# Patient Record
Sex: Male | Born: 1950
Health system: Southern US, Community
[De-identification: ages and names within clinical notes are randomized; demographics above are authoritative.]

## PROBLEM LIST (undated history)

## (undated) DIAGNOSIS — G43909 Migraine, unspecified, not intractable, without status migrainosus: Secondary | ICD-10-CM

## (undated) DIAGNOSIS — R251 Tremor, unspecified: Secondary | ICD-10-CM

## (undated) DIAGNOSIS — M109 Gout, unspecified: Principal | ICD-10-CM

## (undated) DIAGNOSIS — T7840XA Allergy, unspecified, initial encounter: Secondary | ICD-10-CM

## (undated) DIAGNOSIS — E119 Type 2 diabetes mellitus without complications: Secondary | ICD-10-CM

## (undated) DIAGNOSIS — G4700 Insomnia, unspecified: Secondary | ICD-10-CM

## (undated) DIAGNOSIS — G4733 Obstructive sleep apnea (adult) (pediatric): Secondary | ICD-10-CM

## (undated) DIAGNOSIS — I10 Essential (primary) hypertension: Secondary | ICD-10-CM

## (undated) DIAGNOSIS — G2581 Restless legs syndrome: Secondary | ICD-10-CM

## (undated) DIAGNOSIS — G4730 Sleep apnea, unspecified: Secondary | ICD-10-CM

## (undated) DIAGNOSIS — Z0000 Encounter for general adult medical examination without abnormal findings: Secondary | ICD-10-CM

## (undated) DIAGNOSIS — R1310 Dysphagia, unspecified: Secondary | ICD-10-CM

## (undated) DIAGNOSIS — K219 Gastro-esophageal reflux disease without esophagitis: Secondary | ICD-10-CM

## (undated) DIAGNOSIS — F32A Depression, unspecified: Secondary | ICD-10-CM

## (undated) DIAGNOSIS — M25511 Pain in right shoulder: Secondary | ICD-10-CM

## (undated) DIAGNOSIS — M1990 Unspecified osteoarthritis, unspecified site: Secondary | ICD-10-CM

## (undated) DIAGNOSIS — D649 Anemia, unspecified: Secondary | ICD-10-CM

## (undated) HISTORY — DX: Tremor, unspecified: R25.1

## (undated) HISTORY — DX: Essential (primary) hypertension: I10

## (undated) HISTORY — DX: Gout, unspecified: M10.9

## (undated) HISTORY — DX: Restless legs syndrome: G25.81

## (undated) HISTORY — DX: Obstructive sleep apnea (adult) (pediatric): G47.33

## (undated) HISTORY — DX: Anemia, unspecified: D64.9

## (undated) HISTORY — DX: Encounter for general adult medical examination without abnormal findings: Z00.00

## (undated) HISTORY — DX: Depression, unspecified: F32.A

## (undated) HISTORY — DX: Gastro-esophageal reflux disease without esophagitis: K21.9

## (undated) HISTORY — DX: Sleep apnea, unspecified: G47.30

## (undated) HISTORY — DX: Migraine, unspecified, not intractable, without status migrainosus: G43.909

## (undated) HISTORY — PX: VASECTOMY: SHX75

## (undated) HISTORY — DX: Dysphagia, unspecified: R13.10

## (undated) HISTORY — DX: Allergy, unspecified, initial encounter: T78.40XA

## (undated) HISTORY — DX: Unspecified osteoarthritis, unspecified site: M19.90

## (undated) HISTORY — PX: KNEE SURGERY: SHX244

## (undated) HISTORY — DX: Pain in right shoulder: M25.511

## (undated) HISTORY — DX: Type 2 diabetes mellitus without complications: E11.9

## (undated) HISTORY — DX: Insomnia, unspecified: G47.00

---
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
## 1996-07-11 HISTORY — PX: HERNIA REPAIR: SHX51

## 2006-10-31 ENCOUNTER — Ambulatory Visit: Payer: Self-pay | Admitting: Internal Medicine

## 2006-11-26 ENCOUNTER — Ambulatory Visit (HOSPITAL_BASED_OUTPATIENT_CLINIC_OR_DEPARTMENT_OTHER): Admission: RE | Admit: 2006-11-26 | Discharge: 2006-11-26 | Payer: Self-pay | Admitting: Internal Medicine

## 2006-12-04 ENCOUNTER — Ambulatory Visit: Payer: Self-pay | Admitting: Internal Medicine

## 2006-12-14 ENCOUNTER — Ambulatory Visit: Payer: Self-pay | Admitting: Internal Medicine

## 2007-01-15 ENCOUNTER — Ambulatory Visit: Payer: Self-pay | Admitting: Internal Medicine

## 2007-05-18 ENCOUNTER — Ambulatory Visit: Payer: Self-pay | Admitting: Internal Medicine

## 2007-07-12 DIAGNOSIS — E119 Type 2 diabetes mellitus without complications: Secondary | ICD-10-CM

## 2007-07-12 HISTORY — DX: Type 2 diabetes mellitus without complications: E11.9

## 2007-07-31 ENCOUNTER — Ambulatory Visit: Payer: Self-pay | Admitting: Internal Medicine

## 2007-07-31 DIAGNOSIS — E119 Type 2 diabetes mellitus without complications: Secondary | ICD-10-CM | POA: Insufficient documentation

## 2007-07-31 DIAGNOSIS — I10 Essential (primary) hypertension: Secondary | ICD-10-CM

## 2007-07-31 DIAGNOSIS — E669 Obesity, unspecified: Secondary | ICD-10-CM | POA: Insufficient documentation

## 2007-07-31 DIAGNOSIS — E1169 Type 2 diabetes mellitus with other specified complication: Secondary | ICD-10-CM

## 2007-07-31 DIAGNOSIS — G4733 Obstructive sleep apnea (adult) (pediatric): Secondary | ICD-10-CM | POA: Insufficient documentation

## 2007-08-10 ENCOUNTER — Ambulatory Visit: Payer: Self-pay | Admitting: Internal Medicine

## 2007-08-10 LAB — CONVERTED CEMR LAB
ALT: 39 units/L (ref 0–53)
AST: 35 units/L (ref 0–37)
Albumin: 4.1 g/dL (ref 3.5–5.2)
Alkaline Phosphatase: 66 units/L (ref 39–117)
BUN: 12 mg/dL (ref 6–23)
Bacteria, UA: NEGATIVE
Bilirubin, Direct: 0.3 mg/dL (ref 0.0–0.3)
Calcium: 9.2 mg/dL (ref 8.4–10.5)
Chloride: 104 meq/L (ref 96–112)
Eosinophils Relative: 1.7 % (ref 0.0–5.0)
GFR calc Af Amer: 81 mL/min
GFR calc non Af Amer: 67 mL/min
Glucose, Bld: 119 mg/dL — ABNORMAL HIGH (ref 70–99)
HCT: 46.5 % (ref 39.0–52.0)
Hemoglobin, Urine: NEGATIVE
Ketones, ur: 40 mg/dL — AB
LDL Cholesterol: 110 mg/dL — ABNORMAL HIGH (ref 0–99)
Mucus, UA: NEGATIVE
Neutrophils Relative %: 59.8 % (ref 43.0–77.0)
Platelets: 273 10*3/uL (ref 150–400)
RBC: 5.22 M/uL (ref 4.22–5.81)
RDW: 12.6 % (ref 11.5–14.6)
Squamous Epithelial / LPF: NEGATIVE /lpf
TSH: 1.55 microintl units/mL (ref 0.35–5.50)
Total CHOL/HDL Ratio: 6.6
Total Protein, Urine: NEGATIVE mg/dL
VLDL: 15 mg/dL (ref 0–40)
WBC: 5.4 10*3/uL (ref 4.5–10.5)
pH: 5.5 (ref 5.0–8.0)

## 2007-08-15 ENCOUNTER — Ambulatory Visit: Payer: Self-pay | Admitting: Internal Medicine

## 2007-08-15 DIAGNOSIS — E782 Mixed hyperlipidemia: Secondary | ICD-10-CM

## 2007-09-05 ENCOUNTER — Encounter: Payer: Self-pay | Admitting: Internal Medicine

## 2007-10-12 ENCOUNTER — Ambulatory Visit: Payer: Self-pay | Admitting: Internal Medicine

## 2007-10-12 LAB — CONVERTED CEMR LAB
ALT: 20 units/L (ref 0–53)
AST: 19 units/L (ref 0–37)
BUN: 14 mg/dL (ref 6–23)
Bacteria, UA: NEGATIVE
Bilirubin Urine: NEGATIVE
CO2: 28 meq/L (ref 19–32)
Calcium: 9 mg/dL (ref 8.4–10.5)
Cholesterol: 91 mg/dL (ref 0–200)
Crystals: NEGATIVE
Glucose, Bld: 98 mg/dL (ref 70–99)
HDL: 23.1 mg/dL — ABNORMAL LOW (ref >39.0)
Hemoglobin, Urine: NEGATIVE
Ketones, ur: NEGATIVE mg/dL
LDL Cholesterol: 57 mg/dL (ref 0–99)
Sodium: 140 meq/L (ref 135–145)
Total Protein, Urine: NEGATIVE mg/dL
Triglycerides: 57 mg/dL (ref 0–149)
Urine Glucose: NEGATIVE mg/dL
Urobilinogen, UA: 0.2 (ref 0.0–1.0)
VLDL: 11 mg/dL (ref 0–40)
WBC, UA: NONE SEEN cells/hpf

## 2007-10-18 ENCOUNTER — Ambulatory Visit: Payer: Self-pay | Admitting: Internal Medicine

## 2007-12-07 ENCOUNTER — Ambulatory Visit: Payer: Self-pay | Admitting: Internal Medicine

## 2007-12-07 LAB — CONVERTED CEMR LAB: Creatinine, Ser: 1.1 mg/dL (ref 0.4–1.5)

## 2007-12-09 ENCOUNTER — Telehealth: Payer: Self-pay | Admitting: Internal Medicine

## 2007-12-13 ENCOUNTER — Ambulatory Visit: Payer: Self-pay | Admitting: Internal Medicine

## 2007-12-13 LAB — CONVERTED CEMR LAB
OCCULT 1: NEGATIVE
OCCULT 2: NEGATIVE
OCCULT 4: NEGATIVE

## 2007-12-14 ENCOUNTER — Telehealth: Payer: Self-pay | Admitting: Internal Medicine

## 2008-01-04 ENCOUNTER — Ambulatory Visit: Payer: Self-pay | Admitting: Internal Medicine

## 2008-04-25 ENCOUNTER — Ambulatory Visit: Payer: Self-pay | Admitting: Internal Medicine

## 2008-04-25 LAB — CONVERTED CEMR LAB
Microalb Creat Ratio: 9.4 mg/g (ref 0.0–30.0)
Microalb, Ur: 1.2 mg/dL (ref 0.0–1.9)

## 2008-05-02 ENCOUNTER — Ambulatory Visit: Payer: Self-pay | Admitting: Internal Medicine

## 2008-05-02 DIAGNOSIS — R209 Unspecified disturbances of skin sensation: Secondary | ICD-10-CM

## 2008-05-16 ENCOUNTER — Ambulatory Visit: Payer: Self-pay | Admitting: Internal Medicine

## 2008-05-16 DIAGNOSIS — J310 Chronic rhinitis: Secondary | ICD-10-CM | POA: Insufficient documentation

## 2008-08-26 ENCOUNTER — Ambulatory Visit: Payer: Self-pay | Admitting: Internal Medicine

## 2008-08-26 LAB — CONVERTED CEMR LAB
Chloride: 104 meq/L (ref 96–112)
GFR calc Af Amer: 99 mL/min
GFR calc non Af Amer: 82 mL/min
Hgb A1c MFr Bld: 4.6 % (ref 4.6–6.0)
Potassium: 4.4 meq/L (ref 3.5–5.1)
Sodium: 142 meq/L (ref 135–145)
Vitamin B-12: 524 pg/mL (ref 211–911)

## 2008-09-02 ENCOUNTER — Ambulatory Visit: Payer: Self-pay | Admitting: Internal Medicine

## 2008-09-02 DIAGNOSIS — R05 Cough: Secondary | ICD-10-CM

## 2008-09-02 DIAGNOSIS — R059 Cough, unspecified: Secondary | ICD-10-CM | POA: Insufficient documentation

## 2008-09-04 ENCOUNTER — Encounter: Payer: Self-pay | Admitting: Internal Medicine

## 2008-10-08 ENCOUNTER — Ambulatory Visit: Payer: Self-pay | Admitting: Internal Medicine

## 2008-10-08 LAB — CONVERTED CEMR LAB
ALT: 23 units/L (ref 0–53)
AST: 22 units/L (ref 0–37)
Alkaline Phosphatase: 49 units/L (ref 39–117)
Bilirubin, Direct: 0.2 mg/dL (ref 0.0–0.3)
Cholesterol: 96 mg/dL (ref 0–200)
Total Protein: 7.2 g/dL (ref 6.0–8.3)

## 2008-10-14 ENCOUNTER — Ambulatory Visit: Payer: Self-pay | Admitting: Internal Medicine

## 2009-02-16 ENCOUNTER — Ambulatory Visit: Payer: Self-pay | Admitting: Internal Medicine

## 2009-02-16 LAB — CONVERTED CEMR LAB
BUN: 23 mg/dL (ref 6–23)
Calcium: 8.9 mg/dL (ref 8.4–10.5)
Chloride: 107 meq/L (ref 96–112)
Creatinine, Ser: 1.1 mg/dL (ref 0.4–1.5)
GFR calc non Af Amer: 73.28 mL/min (ref >60)
Hgb A1c MFr Bld: 4.7 % (ref 4.6–6.5)

## 2009-02-19 ENCOUNTER — Ambulatory Visit: Payer: Self-pay | Admitting: Internal Medicine

## 2009-02-19 DIAGNOSIS — M25511 Pain in right shoulder: Secondary | ICD-10-CM

## 2009-02-19 HISTORY — DX: Pain in right shoulder: M25.511

## 2009-03-02 ENCOUNTER — Telehealth: Payer: Self-pay | Admitting: Internal Medicine

## 2009-03-26 ENCOUNTER — Telehealth: Payer: Self-pay | Admitting: Internal Medicine

## 2009-03-30 ENCOUNTER — Encounter: Payer: Self-pay | Admitting: Internal Medicine

## 2009-05-15 ENCOUNTER — Ambulatory Visit: Payer: Self-pay | Admitting: Internal Medicine

## 2009-08-13 ENCOUNTER — Ambulatory Visit: Payer: Self-pay | Admitting: Internal Medicine

## 2009-08-13 LAB — CONVERTED CEMR LAB
CO2: 32 meq/L (ref 19–32)
Calcium: 9.2 mg/dL (ref 8.4–10.5)
Creatinine, Ser: 1.2 mg/dL (ref 0.4–1.5)
GFR calc non Af Amer: 66.17 mL/min (ref >60)

## 2009-08-20 ENCOUNTER — Telehealth: Payer: Self-pay | Admitting: Internal Medicine

## 2009-08-20 ENCOUNTER — Ambulatory Visit: Payer: Self-pay | Admitting: Internal Medicine

## 2009-09-04 ENCOUNTER — Ambulatory Visit: Payer: Self-pay | Admitting: Internal Medicine

## 2009-09-04 LAB — CONVERTED CEMR LAB: Fecal Occult Bld: NEGATIVE

## 2009-09-07 ENCOUNTER — Encounter: Payer: Self-pay | Admitting: Internal Medicine

## 2009-09-08 ENCOUNTER — Encounter: Payer: Self-pay | Admitting: Internal Medicine

## 2009-09-09 ENCOUNTER — Encounter: Payer: Self-pay | Admitting: Internal Medicine

## 2009-11-24 ENCOUNTER — Telehealth: Payer: Self-pay | Admitting: Internal Medicine

## 2009-12-10 ENCOUNTER — Ambulatory Visit: Payer: Self-pay | Admitting: Internal Medicine

## 2009-12-10 LAB — CONVERTED CEMR LAB
CO2: 32 meq/L (ref 19–32)
Calcium: 8.9 mg/dL (ref 8.4–10.5)
Creatinine, Ser: 1 mg/dL (ref 0.4–1.5)
Fructosamine: 257 micromoles/L (ref <285)

## 2009-12-17 ENCOUNTER — Ambulatory Visit: Payer: Self-pay | Admitting: Internal Medicine

## 2010-01-22 ENCOUNTER — Encounter: Payer: Self-pay | Admitting: Internal Medicine

## 2010-05-14 ENCOUNTER — Ambulatory Visit: Payer: Self-pay | Admitting: Internal Medicine

## 2010-06-10 ENCOUNTER — Ambulatory Visit: Payer: Self-pay | Admitting: Internal Medicine

## 2010-06-10 LAB — CONVERTED CEMR LAB
ALT: 32 units/L (ref 0–53)
CO2: 31 meq/L (ref 19–32)
Calcium: 8.9 mg/dL (ref 8.4–10.5)
Creatinine, Ser: 1.3 mg/dL (ref 0.4–1.5)
Glucose, Bld: 110 mg/dL — ABNORMAL HIGH (ref 70–99)
HDL: 28.9 mg/dL — ABNORMAL LOW (ref >39.00)
Total CHOL/HDL Ratio: 4
VLDL: 12 mg/dL (ref 0.0–40.0)

## 2010-06-17 ENCOUNTER — Ambulatory Visit: Payer: Self-pay | Admitting: Internal Medicine

## 2010-06-17 DIAGNOSIS — R252 Cramp and spasm: Secondary | ICD-10-CM

## 2010-06-17 DIAGNOSIS — L570 Actinic keratosis: Secondary | ICD-10-CM | POA: Insufficient documentation

## 2010-08-08 LAB — CONVERTED CEMR LAB
CO2: 27 meq/L (ref 19–32)
Chloride: 97 meq/L (ref 96–112)
Creatinine, Ser: 1.1 mg/dL (ref 0.4–1.5)
Glucose, Bld: 488 mg/dL — ABNORMAL HIGH (ref 70–99)
Sodium: 132 meq/L — ABNORMAL LOW (ref 135–145)

## 2010-08-12 NOTE — Progress Notes (Signed)
Summary: please remove from med list  Phone Note Call from Patient   Caller: pt live Call For: Artist Pais  Summary of Call: please take the voltaren 1% off his med list  Initial call taken by: Roselle Locus,  August 20, 2009 4:03 PM  Follow-up for Phone Call        Done. Follow-up by: Lucious Groves,  August 20, 2009 4:20 PM        Current Medications (verified): 1)  Vitamin C 500 Mg  Tabs (Ascorbic Acid) .... Take 1 Tablet By Mouth T Two Times A Day 2)  Chelated Potassium 99 Mg  Tabs (Potassium) .... Take 1 Tablet By Mouth Once A Day 3)  B-12 250 Mcg  Tabs (Cyanocobalamin) .... One Tablet By Mouth Twice A Week 4)  Calcium-Magnesium-Zinc 1000-400-15 Mg  Tabs (Calcium-Magnesium-Zinc) .... Take 1 Tablet By Mouth Once A Day 5)  Onetouch Ultra Test   Strp (Glucose Blood) .... For Up To Three Times A Day Use 6)  Onetouch Ultrasoft Lancets   Misc (Lancets) .... For Up To Three Times A Day Use 7)  Lisinopril 40 Mg Tabs (Lisinopril) .... Take 1 Tablet By Mouth Once A Day 8)  Amlodipine Besylate 10 Mg Tabs (Amlodipine Besylate) .... One By Mouth Qd 9)  Simvastatin 20 Mg  Tabs (Simvastatin) .... Take 1 Tab By Mouth At Bedtime 10)  Cpap 7 Cwp Advanced 11)  Cinnamon/chromium  500-200 Mg Caps (Cinnamon) .Marland Kitchen.. 1 By Mouth Two Times A Day 12)  Aspirin Ec 81 Mg Tbec (Aspirin) .... One By Mouth Qd  Allergies: No Known Drug Allergies

## 2010-08-12 NOTE — Letter (Signed)
Summary: [facility]: Immunoassay Fecal Occult Blood (iFOB) Order Psychologist, counselling at [facility]  [address]   [place], [place] [address]   Phone: [phone]  Fax: [phone]      [facility]: Immunoassay Fecal Occult Blood (iFOB) Order Form   September 04, 2009 MRN: [id number]   RIDER ERMIS 1950-12-20   Physician Name:Dr Thomos Lemons  Diagnosis Code: V70.0      Glendell Docker CMA

## 2010-08-12 NOTE — Letter (Signed)
Summary: [facility]  [facility]   Imported By: Lanelle Bal 09/17/2009 08:05:50  _____________________________________________________________________  External Attachment:    Type:   Image     Comment:   External Document

## 2010-08-12 NOTE — Assessment & Plan Note (Signed)
Summary: 4 month follow up/mhf   Vital Signs:  Patient profile:   60 year old male Height:      71 inches Weight:      203 pounds BMI:     28.42 O2 Sat:      98 % on Room air Temp:     97.7 degrees F oral Pulse rate:   63 / minute Pulse rhythm:   regular Resp:     18 per minute BP sitting:   130 / 80  (right arm) Cuff size:   large  Vitals Entered By: Glendell Docker CMA (December 17, 2009 3:34 PM)  O2 Flow:  Room air CC: Rm 2- 4 Month Follow up Is Patient Diabetic? Yes Did you bring your meter with you today? No   Primary Care Provider:  DThomos Lemons DO  CC:  Rm 2- 4 Month Follow up.  History of Present Illness: 60 y/o white male c/o persitent swelling of knuckles in both hands for the past 2 months   abnormal glucose - low blood sugar 88, high 110, avg low 90's, stopped taking 81 mg asa   had a tooth infection and was placed on anitibiotics about 2 months ago    Preventive Screening-Counseling & Management  Alcohol-Tobacco     Smoking Status: never  Allergies: No Known Drug Allergies  Past History:  Past Medical History: Obstructive Sleep Apnea Hypertension  Remote history of Migraine Headaches  Diabetes Mellitus Type II  1/09      Past Surgical History: Tonsillectomy -1959 Umbilical Hernia Repair -  1998   Left Knee surgery - 1980  Vasectomy   Family History: Mother alive age 13 - breast cancer, hypertension Father alive age 69 - hyperlipidemia       Social History: Occupation:  Energy manager Married  -  1 daughter (age 36)  Never Smoked Alcohol use-no   Drug use-no     Physical Exam  General:  alert, well-developed, and well-nourished.   Neck:  supple and no carotid bruits.   Lungs:  normal respiratory effort and normal breath sounds.   Heart:  normal rate, regular rhythm, and no gallop.   Extremities:  trace left pedal edema and trace right pedal edema.   Psych:  normally interactive and good eye contact.     Impression &  Recommendations:  Problem # 1:  DIABETES MELLITUS, TYPE II, BORDERLINE (ICD-790.29) Assessment Unchanged A1c stable.   He is giving blood leff often.  fructosamine normal continue diet and exercise  Problem # 2:  HYPERTENSION (ICD-401.9) well controlled.  edema likely related to amlodipine.   consider take 1/2 of amlodipine  His updated medication list for this problem includes:    Lisinopril 40 Mg Tabs (Lisinopril) .Marland Kitchen... Take 1 tablet by mouth once a day    Amlodipine Besylate 10 Mg Tabs (Amlodipine besylate) ..... One by mouth qd  BP today: 130/80 Prior BP: 128/64 (08/20/2009)  Labs Reviewed: K+: 3.6 (12/10/2009) Creat: : 1.0 (12/10/2009)   Chol: 96 (10/08/2008)   HDL: 27.30 (10/08/2008)   LDL: 54 (10/08/2008)   TG: 72.0 (10/08/2008)  Complete Medication List: 1)  Vitamin C 500 Mg Tabs (Ascorbic acid) .... Take 1 tablet by mouth t two times a day 2)  Chelated Potassium 99 Mg Tabs (Potassium) .... Take 1 tablet by mouth once a day 3)  B-12 250 Mcg Tabs (Cyanocobalamin) .... One tablet by mouth twice a week 4)  Calcium-magnesium-zinc 1000-400-15 Mg Tabs (Calcium-magnesium-zinc) .... Take 1  tablet by mouth once a day 5)  Onetouch Ultra Test Strp (Glucose blood) .... For up to three times a day use 6)  Onetouch Ultrasoft Lancets Misc (Lancets) .... For up to three times a day use 7)  Lisinopril 40 Mg Tabs (Lisinopril) .... Take 1 tablet by mouth once a day 8)  Amlodipine Besylate 10 Mg Tabs (Amlodipine besylate) .... One by mouth qd 9)  Simvastatin 20 Mg Tabs (Simvastatin) .... Take 1 tab by mouth at bedtime 10)  Cpap 7 Cwp Advanced  11)  Cinnamon/chromium 500-200 Mg Caps (cinnamon)  .Marland Kitchen.. 1 by mouth two times a day 12)  Aspirin Ec 81 Mg Tbec (Aspirin) .... One by mouth qd  Patient Instructions: 1)  Please schedule a follow-up appointment in 6 months. 2)  BMP prior to visit, ICD-9:  401.9 3)  HbgA1C prior to visit, ICD-9: 790.29 4)  AST, ALT Lipid Panel prior to visit, ICD-9:   272.4 5)  Please return for lab work one (1) week before your next appointment.

## 2010-08-12 NOTE — Letter (Signed)
   [place] at [facility] [address] [place], [place]  [address]  [place] Phone: [phone]      September 07, 2009   KAMDON REISIG [address] [place], [place] [address]  RE:  LAB RESULTS  Dear  Mr. SPIERING,  The following is an interpretation of your most recent lab tests.  Please take note of any instructions provided or changes to medications that have resulted from your lab work.  Hemoccult Test for blood in stool:  negative          Sincerely Yours,    Dr. Thomos Lemons

## 2010-08-12 NOTE — Medication Information (Signed)
Summary: [facility]   Imported By: Lanelle Bal 02/04/2010 11:40:42  _____________________________________________________________________  External Attachment:    Type:   Image     Comment:   External Document

## 2010-08-12 NOTE — Progress Notes (Signed)
Summary: Amlodipine  Phone Note Refill Request Message from:  Fax from Pharmacy on Nov 24, 2009 4:21 PM  Refills Requested: Medication #1:  AMLODIPINE BESYLATE 10 MG TABS one by mouth qd   Dosage confirmed as above?Dosage Confirmed   Brand Name Necessary? No   Supply Requested: 3 months   Last Refilled: 08/27/2009  Method Requested: Electronic Next Appointment Scheduled: 6/9 @ 3:30p Initial call taken by: Glendell Docker CMA,  Nov 24, 2009 4:22 PM  Follow-up for Phone Call        ok to refill x 3 Follow-up by: D. Thomos Lemons DO,  Nov 24, 2009 5:11 PM  Additional Follow-up for Phone Call Additional follow up Details #1::        rx sent to pharmacy Additional Follow-up by: Glendell Docker CMA,  Nov 25, 2009 8:15 AM    Prescriptions: AMLODIPINE BESYLATE 10 MG TABS (AMLODIPINE BESYLATE) one by mouth qd  #90 x 3   Entered by:   Glendell Docker CMA   Authorized by:   D. Thomos Lemons DO   Signed by:   Glendell Docker CMA on 11/25/2009   Method used:   Electronically to        [facility]  [phone]* (retail)       [address]       [place], [place]  [address]       Ph: [phone] or [phone]       Fax: [phone]   RxID:   [id number]

## 2010-08-12 NOTE — Assessment & Plan Note (Signed)
Summary: 6 month follow up/mhf--Rm 2   Vital Signs:  Patient profile:   60 year old male Height:      71 inches Weight:      213 pounds BMI:     29.81 Temp:     97.6 degrees F oral Pulse rate:   72 / minute Pulse rhythm:   regular Resp:     18 per minute BP sitting:   130 / 78  (left arm) Cuff size:   large  Vitals Entered By: Mervin Kung CMA Duncan Dull) (June 17, 2010 3:38 PM) CC: Pt here for 6 month f/u.  Is Patient Diabetic? Yes Pain Assessment Patient in pain? no        Primary Care Provider:  DThomos Lemons DO  CC:  Pt here for 6 month f/u. Marland Kitchen  History of Present Illness:  60 y/o Emmie Frakes male for f/u  DM II -checking his blood sugar approx once per week - normal  Pt has several concerns today: 1. Bilateral calf and hamstring cramping x 2 months.  no exertional symptoms 2. Great toe tingling bilaterally x 1 month. 3. Increasing raised dry area on head x years.  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   Lungs:  normal respiratory effort and normal breath sounds.   Heart:  normal rate, regular rhythm, and no gallop.   Pulses:  dorsalis pedis and posterior tibial pulses are full and equal bilaterally Extremities:  trace left pedal edema and trace right  pedal edema.   Neurologic:  cranial nerves II-XII intact and gait normal.   Skin:  scaly skin lesions scalp - near vertex   Impression & Recommendations:  Problem # 1:  DIABETES MELLITUS, TYPE II, BORDERLINE (ICD-790.29) Assessment Unchanged  Labs Reviewed: Creat: 1.3 (06/10/2010)     Last Eye Exam: normal (09/08/2009)  Problem # 2:  HYPERTENSION (ICD-401.9) Assessment: Unchanged  His updated medication list for this problem includes:    Lisinopril 40 Mg Tabs (Lisinopril) .Marland Kitchen... Take 1 tablet by mouth once a day    Amlodipine Besylate 10 Mg Tabs (Amlodipine besylate) ..... One by mouth qd  BP today: 130/78 Prior BP: 138/80 (05/14/2010)  Labs Reviewed: K+: 4.1 (06/10/2010) Creat: : 1.3 (06/10/2010)   Chol: 113 (06/10/2010)   HDL: 28.90 (06/10/2010)   LDL: 72 (06/10/2010)   TG: 60.0 (06/10/2010)  Problem # 3:  MUSCLE CRAMPS (ICD-729.82) Assessment: New recent electrolytes normal. trial of OTC CoQ 10  Problem # 4:  ACTINIC KERATOSIS (ICD-702.0) Assessment: New  applied liquid nitrogen for destruction of AK on top of scalp (2 lesions)  Orders: Cryotherapy/Destruction benign or premalignant lesion (1st lesion)  ([address]000) Cryotherapy/Destruction benign or premalignant lesion (2nd-14th lesions) (17003)  Complete  Medication List: 1)  Vitamin C 500 Mg Tabs (Ascorbic acid) .... Take 1 tablet by mouth t two times a day 2)  Chelated Potassium 99 Mg Tabs (Potassium) .... Take 1 tablet by mouth once a day 3)  B-12 250 Mcg Tabs (Cyanocobalamin) .... One tablet by mouth twice a week 4)  Calcium-magnesium-zinc 1000-400-15 Mg Tabs (Calcium-magnesium-zinc) .... Take 1 tablet by mouth once a day 5)  Onetouch Ultra Test Strp (Glucose blood) .... Check blood sugar once a week. 6)  Onetouch Ultrasoft Lancets Misc (Lancets) .... For up to three times a day use 7)  Lisinopril 40 Mg Tabs (Lisinopril) .... Take 1 tablet by mouth once a day 8)  Amlodipine Besylate 10 Mg Tabs (Amlodipine besylate)  .... One by mouth qd 9)  Simvastatin 20 Mg Tabs (Simvastatin) .... Take 1/2  tab by mouth at bedtime 10)  Cpap 7 Cwp Advanced  11)  Cinnamon/chromium 500-200 Mg Caps (cinnamon)  .Marland Kitchen.. 1 by mouth two times a day 12)  Fish Oil 1500mg   .... Take 1 capsule by mouth once a day.  Patient Instructions: 1)  Take CoQ 10 100 mg - 200 mg once daily 2)  Please schedule a follow-up appointment in 6 months.   Orders Added: 1)  Est. Patient Level III [[address]] 2)  Cryotherapy/Destruction benign or premalignant lesion (1st lesion)  [[address]000] 3)  Cryotherapy/Destruction benign or premalignant lesion (2nd-14th lesions) [[address]]    Current Allergies (reviewed today): No known allergies

## 2010-08-12 NOTE — Assessment & Plan Note (Signed)
Summary: 6 MONTH FUP//CCM   Vital Signs:  Patient profile:   60 year old male Height:      71 inches Weight:      209.25 pounds BMI:     29.29 O2 Sat:      96 % on Room air Temp:     97.6 degrees F oral Pulse rate:   72 / minute BP sitting:   128 / 64  (right arm)  Vitals Entered By: Lucious Groves (August 20, 2009 3:35 PM)  O2 Flow:  Room air CC: 6 mo f/u--Pt states that he is somewhat the same in most regards, but is a little better./kb, Type 2 diabetes mellitus follow-up Is Patient Diabetic? Yes Did you bring your meter with you today? No Pain Assessment Patient in pain? no        Primary Care Provider:  Dondra Spry DO  CC:  6 mo f/u--Pt states that he is somewhat the same in most regards, but is a little better./kb, and Type 2 diabetes mellitus follow-up.  History of Present Illness:  Type 2 Diabetes Mellitus Follow-Up      This is a 60 year old man who presents for Type 2 diabetes mellitus follow-up.  The patient denies polyuria, polydipsia, self managed hypoglycemia, and hypoglycemia requiring help.  The patient denies the following symptoms: chest pain.  Since the last visit the patient reports good dietary compliance, exercising regularly, and monitoring blood glucose.      pt gives blood q 8-9 weeks.  when diabetes was first diagnosed, pt was not giving blood regularly  Htn - stable.  Current Medications (verified): 1)  Vitamin C 500 Mg  Tabs (Ascorbic Acid) .... Take 1 Tablet By Mouth T Two Times A Day 2)  Chelated Potassium 99 Mg  Tabs (Potassium) .... Take 1 Tablet By Mouth Once A Day 3)  B-12 250 Mcg  Tabs (Cyanocobalamin) .... One Tablet By Mouth Twice A Week 4)  Calcium-Magnesium-Zinc 1000-400-15 Mg  Tabs (Calcium-Magnesium-Zinc) .... Take 1 Tablet By Mouth Once A Day 5)  Onetouch Ultra Test   Strp (Glucose Blood) .... For Up To Three Times A Day Use 6)  Onetouch Ultrasoft Lancets   Misc (Lancets) .... For Up To Three Times A Day Use 7)  Lisinopril 40 Mg  Tabs (Lisinopril) .... Take 1 Tablet By Mouth Once A Day 8)  Amlodipine Besylate 10 Mg Tabs (Amlodipine Besylate) .... One By Mouth Qd 9)  Simvastatin 20 Mg  Tabs (Simvastatin) .... Take 1 Tab By Mouth At Bedtime 10)  Cpap 7 Cwp Advanced 11)  Voltaren 1 % Gel (Diclofenac Sodium) .... Apply 2 Gm Three Times A Day 12)  Cinnamon/chromium  500-200 Mg Caps (Cinnamon) .Marland Kitchen.. 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   Pt has been giving blood every 8-9 wks.  Pt advised to not give blood x 6 months.  I  suspect DM II still well controlled.  AM CBGs reported low 100's  His updated medication list for this problem includes:    Lisinopril 40 Mg Tabs (Lisinopril) .Marland Kitchen... Take 1 tablet by mouth once a day    Aspirin Ec 81 Mg Tbec (Aspirin) ..... One by mouth qd  Labs Reviewed: Creat: 1.2 (08/13/2009)     Last Eye Exam: normal (09/05/2007) Reviewed HgBA1c results: 5.0 (08/13/2009)  4.7 (02/16/2009)  Problem # 2:  HYPERTENSION (ICD-401.9) well controlled.  Maintain current medication regimen.  His updated medication list for this problem includes:    Lisinopril 40 Mg Tabs (Lisinopril) .Marland Kitchen... Take 1 tablet by mouth once a day    Amlodipine Besylate 10 Mg Tabs (Amlodipine besylate) ..... One by mouth qd  BP today: 128/64 Prior BP: 130/78 (05/15/2009)  Labs Reviewed: K+: 4.0 (08/13/2009) Creat: : 1.2 (08/13/2009)   Chol: 96  (10/08/2008)   HDL: 27.30 (10/08/2008)   LDL: 54 (10/08/2008)   TG: 72.0 (10/08/2008)  Problem # 3:  PREVENTIVE HEALTH CARE (ICD-V70.0) Immunoassay for FOBT  Complete Medication List: 1)  Vitamin C 500 Mg Tabs (Ascorbic acid) .... Take 1 tablet by mouth t two times a day 2)  Chelated Potassium 99 Mg Tabs (Potassium) .... Take 1 tablet by mouth once a day 3)  B-12 250 Mcg Tabs (Cyanocobalamin) .... One tablet by mouth twice a week 4)  Calcium-magnesium-zinc 1000-400-15 Mg Tabs (Calcium-magnesium-zinc) .... Take 1 tablet by mouth once a day 5)  Onetouch Ultra Test Strp (Glucose blood) .... For up to three times a day use 6)  Onetouch Ultrasoft Lancets Misc (Lancets) .... For up to three times a day use 7)  Lisinopril 40 Mg Tabs (Lisinopril) .... Take 1 tablet by mouth once a day 8)  Amlodipine Besylate 10 Mg Tabs (Amlodipine besylate) .... One by mouth qd 9)  Simvastatin 20 Mg Tabs (Simvastatin) .... Take 1 tab by mouth at bedtime 10)  Cpap 7 Cwp Advanced  11)  Voltaren 1 % Gel (Diclofenac sodium) .... Apply 2 gm three times a day 12)  Cinnamon/chromium 500-200 Mg Caps (cinnamon)  .Marland Kitchen.. 1 by mouth two times a day 13)  Aspirin Ec 81 Mg Tbec (Aspirin) .... One by mouth qd  Patient Instructions: 1)  Please schedule a follow-up appointment in 4 months. 2)  complete stool study 3)  BMP prior to visit, ICD-9: 401.9 4)  HbgA1C prior to visit, ICD-9: 250.00 5)  Fructosamine:  250.00 6)  Please return for lab work one (1) week before your next appointment.   Preventive Care Screening  Last Flu Shot:    Date:  05/13/2009    Results:  Historical

## 2010-08-12 NOTE — Assessment & Plan Note (Signed)
Summary: rov 1 yr ///kp   Primary Tiras Bianchini/Referring Kaymon Denomme:  Dondra Spry DO  CC:  Yearly follow up visit-Sleep Apnea; sleeping well and using CPAP each night(aHC)..  History of Present Illness: From 05/19/07- PROBLEMS: 1. Obstructive sleep apnea. 2. Hypertension. HISTORY:  He returns for follow up reporting that he has done very well using CPAP 7 CWP even when he had a head cold. He is able to use it every night. We did talk about maintenance of his mask. He has not made effort to get a primary care physician and we discussed his blood pressure again today with concern emphasized.   05/16/08- OSA- Continues comfortable on CPAP 7. Rarely uses humidifier. Sleep is comfortable. Discussed cpap and options.  May 15, 2009- ,OSA, Rhinitis HTN, DM Incidental dry cough- discussed Lisinopril. He says substitution didn't help. Never smoked. Continued CPAP 7- still works well. Uses it virtually every night unless nasal congestion is too severe. We discussed Afrin.  May 14, 2010- OSA, Rhinitis HTN, DM Nurse-CC: Yearly follow up visit-Sleep Apnea; sleeping well and using CPAP each night [facility]). CPAP 7 Advanced - continues to use it all night every night, except for one 3-day stretch during a URI which is resolved. He averages 6 hours sleep/ night and will take an occasional nap. Couple of cups of coffee in evening with no effect on sleep, no sleeping pills. He had worked 3rd shift for many years and has remained a bit of a night owl. Not told of snoring through his CPAP. No recent cardiopulmonary issues. Needs flu vax.  Preventive Screening-Counseling & Management  Alcohol-Tobacco     Alcohol drinks/day: 0     Alcohol Counseling: not indicated; patient does not drink     Smoking Status: never     Tobacco Counseling: not indicated; no tobacco use  Current Medications (verified): 1)  Vitamin C 500 Mg  Tabs (Ascorbic Acid) .... Take 1 Tablet By Mouth T Two Times A Day 2)  Chelated  Potassium 99 Mg  Tabs (Potassium) .... Take 1 Tablet By Mouth Once A Day 3)  B-12 250 Mcg  Tabs (Cyanocobalamin) .... One Tablet By Mouth Twice A Week 4)  Calcium-Magnesium-Zinc 1000-400-15 Mg  Tabs (Calcium-Magnesium-Zinc) .... Take 1 Tablet By Mouth Once A Day 5)  Onetouch Ultra Test   Strp (Glucose Blood) .... For Up To Three Times A Day Use 6)  Onetouch Ultrasoft Lancets   Misc (Lancets) .... For Up To Three Times A Day Use 7)  Lisinopril 40 Mg Tabs (Lisinopril) .... Take 1 Tablet By Mouth Once A Day 8)  Amlodipine Besylate 10 Mg Tabs (Amlodipine Besylate) .... One By Mouth Qd 9)  Simvastatin 20 Mg  Tabs (Simvastatin) .... Take 1 Tab By Mouth At Bedtime 10)  Cpap 7 Cwp Advanced 11)  Cinnamon/chromium  500-200 Mg Caps (Cinnamon) .Marland Kitchen.. 1 By Mouth Two Times A Day  Allergies (verified): No Known Drug Allergies  Past History:  Past Medical History: Last updated: 12/17/2009 Obstructive Sleep Apnea Hypertension  Remote history of Migraine Headaches  Diabetes Mellitus Type II  1/09      Past Surgical History: Last updated: 12/17/2009 Tonsillectomy -1959 Umbilical Hernia Repair -  1998   Left Knee surgery - 1980  Vasectomy   Family History: Last updated: 12/17/2009 Mother alive age 17 - breast cancer, hypertension Father alive age 101 - hyperlipidemia       Social History: Last updated: 12/17/2009 Occupation:  Energy manager Married  -  1  daughter (age 66)  Never Smoked Alcohol use-no   Drug use-no     Risk Factors: Alcohol Use: 0 (05/14/2010) Caffeine Use: 3-4 beverages daily (02/19/2009) Exercise: no (02/19/2009)  Risk Factors: Smoking Status: never (05/14/2010)  Review of Systems      See HPI  The patient denies anorexia, fever, weight loss, weight gain, vision loss, decreased hearing, hoarseness, chest pain, syncope, dyspnea on exertion, peripheral edema, prolonged cough, headaches, hemoptysis, abdominal pain, severe indigestion/heartburn, muscle weakness,  abnormal bleeding, enlarged lymph nodes, and angioedema.    Vital Signs:  Patient profile:   60 year old male Height:      71 inches Weight:      213.13 pounds BMI:     29.83 O2 Sat:      96 % on Room air Pulse rate:   67 / minute BP sitting:   138 / 80  (left arm) Cuff size:   large  Vitals Entered By: Vivianne Spence  O2 Flow:  Room air CC: Yearly follow up visit-Sleep Apnea; sleeping well and using CPAP each night(aHC).   Physical Exam  Additional Exam:  General: A/Ox3; pleasant and cooperative, NAD, trim SKIN: no rash, lesions NODES: no lymphadenopathy HEENT: [place]/AT, EOM- WNL, Conjuctivae- clear, PERRLA, TM-WNL, Nose- clear, Throat- clear and wnl, Mallampati  II NECK: Supple w/ fair ROM, JVD- none, normal carotid impulses w/o bruits Thyroid- normal to palpation, no stridor CHEST: Clear to P&A, no cough, wheeze or rales HEART: RRR, no m/g/r heard ABDOMEN: medium build CBJ:SEGB, nl pulses, no edema  NEURO: Grossly intact to observation      Impression & Recommendations:  Problem # 1:  SLEEP APNEA, OBSTRUCTIVE (ICD-327.23)  Good compliance and control with CPAP. Status and sleep hygiene reviewed. He has some residual from years working 3rd shft, but is stable and comfortable with no intervention needed. Remains very calm, soft spoken individual.  Problem # 2:  CHRONIC RHINITIS (ICD-472.0)  Nasal airway is clear on exam and he is without complaint at this time. Polyps are not seen.  Problem # 3:  COUGH (ICD-786.2) Clear chest without complaint of cough recently  Other Orders: Est. Patient Level IV (15176) Flu Vaccine 19yrs + (16073) Admin 1st Vaccine (71062)  Patient Instructions: 1)  Please schedule a follow-up appointment in 1 year. 2)  Continue CPAP at 7. Please call as needed. 3)  Flu vax   Immunizations Administered:  Influenza Vaccine # 1:    Vaccine Type: Fluvax 3+    Site: left deltoid    Mfr: [employer]is    Dose: 0.5 ml    Route: IM    Given  by: Zackery Barefoot CMA    Exp. Date: 12/2010    Lot #: [id number]  Flu Vaccine Consent Questions:    Do you have a history of severe allergic reactions to this vaccine? no    Any prior history of allergic reactions to egg and/or gelatin? no    Do you have a sensitivity to the preservative Thimersol? no    Do you have a past history of Guillan-Barre Syndrome? no    Do you currently have an acute febrile illness? no    Have you ever had a severe reaction to latex? no    Vaccine information given and explained to patient? yes

## 2010-08-12 NOTE — Miscellaneous (Signed)
Summary: Eye Exam  Clinical Lists Changes  Observations: Added new observation of DMEYEEXAMNXT: 09/2010 (09/09/2009 15:36) Added new observation of DMEYEEXMRES: normal (09/08/2009 15:37) Added new observation of EYE EXAM BY: Dr Ernesto Rutherford [phone] (09/08/2009 15:37) Added new observation of DIAB EYE EX: normal (09/08/2009 15:37)       Diabetes Management Exam:    Eye Exam:       Eye Exam done elsewhere          Date: 09/08/2009          Results: normal          Done by: Dr Ernesto Rutherford [phone]

## 2010-09-15 ENCOUNTER — Encounter: Payer: Self-pay | Admitting: Internal Medicine

## 2010-09-20 ENCOUNTER — Encounter: Payer: Self-pay | Admitting: Internal Medicine

## 2010-09-28 NOTE — Miscellaneous (Signed)
Summary: Diabetic Eye Exam  Clinical Lists Changes  Observations: Added new observation of DMEYEEXAMNXT: 09/2011 (09/20/2010 10:41) Added new observation of DMEYEEXMRES: normal (09/15/2010 10:42) Added new observation of EYE EXAM BY: [facility] (09/15/2010 10:42) Added new observation of DIAB EYE EX: normal (09/15/2010 10:42)       Diabetes Management Exam:    Eye Exam:       Eye Exam done elsewhere          Date: 09/15/2010          Results: normal          Done by: [facility]

## 2010-09-28 NOTE — Letter (Signed)
Summary: [facility]  [facility]   Imported By: Maryln Gottron 09/23/2010 14:46:09  _____________________________________________________________________  External Attachment:    Type:   Image     Comment:   External Document

## 2010-10-27 ENCOUNTER — Telehealth: Payer: Self-pay | Admitting: Internal Medicine

## 2010-10-27 NOTE — Telephone Encounter (Signed)
Refill- lisinopril 40mg  tab. Take one tablet by mouth every day. Qty 30. Last fill 3.18.12

## 2010-10-28 MED ORDER — LISINOPRIL 40 MG PO TABS: 40.0000 mg | ORAL_TABLET | Freq: Every day | ORAL | Status: DC

## 2010-11-11 ENCOUNTER — Telehealth: Payer: Self-pay | Admitting: Internal Medicine

## 2010-11-11 MED ORDER — AMLODIPINE BESYLATE 10 MG PO TABS: 10.0000 mg | ORAL_TABLET | Freq: Every day | ORAL | Status: DC

## 2010-11-11 NOTE — Telephone Encounter (Signed)
Refill- amlodipine 10mg  tab. Take one tablet by mouth every day. Qty 90. Last fill 3.30.12

## 2010-11-23 NOTE — Assessment & Plan Note (Signed)
[facility]                             PULMONARY OFFICE NOTE   Evan Best, Evan Best                     MRN:          [id number]  DATE:01/15/2007                            DOB:          Jul 22, 1950    PROBLEM:  1. Obstructive sleep apnea.  2. Hypertension.   HISTORY:  He returns now having tried CPAP at 7 CWP for the past month.  On balance, he says his quality of life is better. He likes the ramp  feature and thinks he is sleeping better. He is clearly still getting  used to it all, but with no dramatic problems voiced.   MEDICATIONS:  We reviewed his medications.   DRUG INTOLERANCES:  No medication allergies.   OBJECTIVE:  Weight 223 pounds.  Blood pressure recorded at 182/104,  pulse 84, room air saturation 97%. He is somewhat overweight. There are  no marks from the mask.  Heart sounds normal.   IMPRESSION:  1. Obstructive sleep apnea.  2. Hypertension, needs primary physician.   PLAN:  Continue continuous positive airway pressure at 7 centimeters of  water pressure. Schedule return in four months, earlier p.r.n.     Clinton D. Maple Hudson, MD, [facility], FACP  Electronically Signed    CDY/MedQ  DD: 01/15/2007  DT: 01/16/2007  Job #: [id number]

## 2010-11-23 NOTE — Assessment & Plan Note (Signed)
[facility]                             PULMONARY OFFICE NOTE   LI, FRAGOSO                     MRN:          [id number]  DATE:05/18/2007                            DOB:          1951/05/14    PROBLEMS:  1. Obstructive sleep apnea.  2. Hypertension.   HISTORY:  He returns for follow up reporting that he has done very well  using CPAP 7 CWP even when he had a head cold. He is able to use it  every night. We did talk about maintenance of his mask. He has not made  effort to get a primary care physician and we discussed his blood  pressure again today with concern emphasized.   MEDICATIONS:  1. Multivitamins.  2. CPAP at 7 CWP.   ALLERGIES:  No medication allergy.   OBJECTIVE:  Weight 224 pounds, blood pressure 172/94, pulse 90, room air  saturation 97%. There is a pressure mark from his mask across the bridge  of his nose and we have reviewed mask fit and comfort. Nasal airway is  clear. Pulse is regular without murmur.   IMPRESSION:  1. Obstructive sleep apnea doing well with CPAP at 7 CWP.  2. Hypertension, severe and neglected. I have again emphasized how      important it is that he get established with a  primary care      physician and get his blood pressure taken care of. He was      instructed to speak with our scheduling ladies up front to get      associated with a primary care physician through this practice.  3. We are scheduling return to me in 1 year, earlier p.r.n.     Clinton D. Maple Hudson, MD, [facility], FACP  Electronically Signed    CDY/MedQ  DD: 05/19/2007  DT: 05/20/2007  Job #: [id number]

## 2010-11-23 NOTE — Assessment & Plan Note (Signed)
[facility]                             PULMONARY OFFICE NOTE   Evan Best, Evan Best                     MRN:          [id number]  DATE:12/14/2006                            DOB:          04-May-1951    PROBLEM:  1. Obstructive sleep apnea.  2. Hypertension.   HISTORY:  He returns after nocturnal polysomnogram on Nov 26, 2006 which  confirmed severe obstructive apnea with an index of 56 per hour,  moderate to loud snoring, and desaturation to 86%.  He was successfully  titrated on CPAP to 7 CWP for an index of 0 per hour.  He did wear his  60 year old bruxism/mandibular advancement appliance and feels he will  continue to wear something to protect against bruxism.  We discussed  sleep apnea and treatment options and I reemphasized his responsibility  to maintain good sleep hygiene, keep his weight down, and to be alert  while driving.   OBJECTIVE:  Weight 217 pounds, blood pressure 156/96, pulse 82, room air  saturation 98%.  He is overweight, alert, pleasant.  Full beard.  No  stridor.  Nasal airway clear.  LUNGS:  Clear.  Pulse regular.   IMPRESSION:  Severe obstructive sleep apnea at 56 per hour complicated  by bruxism and hypertension.  He should be a good continuous positive  airway pressure candidate if we can get it fitted comfortably.   PLAN:  He will try CPAP at 7 CWP being established through [facility].  Schedule return one month, earlier p.r.n.     Clinton D. Maple Hudson, MD, [facility] Bollman, FACP  Electronically Signed    CDY/MedQ  DD: 12/16/2006  DT: 12/16/2006  Job #: [id number]

## 2010-11-23 NOTE — Procedures (Signed)
NAME:  Evan Best, Evan Best NO.:  [id number]   MEDICAL RECORD NO.:  [id number]          PATIENT TYPE:  OUT   LOCATION:  SLEEP CENTER                 FACILITY:  [facility]   PHYSICIAN:  Clinton D. Maple Hudson, MD, FCCP, FACPDATE OF BIRTH:  11-Oct-1950   DATE OF STUDY:  11/26/2006                            NOCTURNAL POLYSOMNOGRAM   REFERRING PHYSICIAN:  Clinton D. Young, MD, FCCP, FACP   INDICATION FOR STUDY:  Hypersomnia with sleep apnea.   EPWORTH SLEEPINESS SCORE:  17/24.  BMI 31.4.  Weight 225 pounds.   MEDICATIONS:  No medications listed.   SLEEP ARCHITECTURE:  Total sleep time 275 minutes with sleep efficiency  62%.  Stage 1 was 9%.  Stage 2 was 78%.  Stages 3 and 4 were 1%. REM 11%  of total sleep time.  Sleep latency 28 minutes.  REM latency 91 minutes.  Awake after sleep onset 143 minutes.  Arousal index 3.9.  No bedtime  medication taken.   RESPIRATORY DATA:  Split study protocol.  Apnea hypopnea index (AHI,  RDI) 56 obstructive events per hour indicating severe obstructive sleep  apnea/hypopnea syndrome before CPAP.  There were 16 obstructive apneas  and 111 hypopneas before CPAP.  Events were not positional.  REM AHI  28.6.  CPAP was titrated to 7 CWP, AHI 0 per hour.  A small comfort gel  mask was used with heated humidifier.  He also wore a mandibular  advancement device.  Technician commented that because of his full  facial hair and reports of occasional nasal congestion he may end up  needing a full face mask.   OXYGEN DATA:  Moderately loud snoring with oxygen desaturation of a  nadir of 86% before CPAP.  After CPAP control, saturation held 95% on  room air.   CARDIAC DATA:  Sinus rhythm with occasional PVC.   MOVEMENT-PARASOMNIA:  Frequent limb jerks, but very few caused arousal  or sleep disturbance.  Bathroom x2.   IMPRESSIONS-RECOMMENDATIONS:  1. Severe obstructive sleep apnea/hypopnea syndrome, AHI 56 per hour      with non positional events,  moderate to loud snoring and oxygen      desaturation to a nadir of 86%.  2. Successful continuous positive airway pressure titration to 7 CWP.      A small comfort gel mask was used with      heated humidifier.  3. The patient did wear his mandibular advancement device through this      study night.      Clinton D. Maple Hudson, MD, [facility], FACP  Diplomate, Biomedical engineer of Sleep Medicine  Electronically Signed     CDY/MEDQ  D:  12/04/2006 08:17:03  T:  12/04/2006 08:40:17  Job:  [id number]

## 2010-11-26 NOTE — Assessment & Plan Note (Signed)
[facility]                             PULMONARY OFFICE NOTE   Evan Best, Evan Best                     MRN:          [id number]  DATE:10/31/2006                            DOB:          12/27/1950    This is a new sleep medicine patient.   PROBLEMS:  This is a 60 year old man with no primary physician, who is  self-referred for evaluation of obstructive sleep apnea.   HISTORY:  He was first diagnosed with a standard sleep study at [facility] approximately 15 years ago.  He may have seen me, we do  not remember.  At that time, he understood he was mild.  He was fitted  with an oral appliance for mandibular advancement by his dentist.  That  worked well until 2 or 3 years ago.  As he remembers it, he had a  significant respiratory illness, flu, after which he may have been  somewhat more congested in the nose.  From that time, he was more aware  of dreams, had more episodes of nocturia, snored loudly, such that his  wife sleeps in a separate room.  She tells him that he stops breathing,  despite the mouthpiece, which he still uses, and that he snores loudly.  He does not feel refreshed by a night's sleep and takes micronaps  during the daytime.  He sleeps longer on weekends.   MEDICATIONS:  Multivitamins, Advil p.r.n.   No medication allergy.   REVIEW OF SYSTEMS:  Chronic nasal congestion.  His weight may be up a  few pounds.  No seasonal rhinitis.  Snoring and other symptoms mainly as  per HPI.  Some indigestion paroxysm, for which he was originally wearing  the mouthpiece.  Bed time is between 11 and 1 am with sleep onset less  than 10 minutes.  Fully awake once or twice during the night before  final waking at 6:30 a.m.   PAST MEDICAL HISTORY:  1. Hypertension.  2. Sleep apnea.  3. Paroxysms.   PAST SURGICAL HISTORY:  Tonsils and adenoids as a child.  No lung  disease, thyroid, or nervous system problems.   SOCIAL HISTORY:   Never smoked.  Married.  Works as an Airline pilot and is  beginning to have some difficulty keeping work because of daytime  sleepiness.  Little benefit from caffeine.   FAMILY HISTORY:  He has some question of whether his sister might  possibly have sleep apnea but does not know for sure that anybody in the  family has been diagnosed.   OBJECTIVE:  VITAL SIGNS:  Weight 220 pounds.  BP 184/120, pulse 88, room  air saturation 94%.  GENERAL:  He is alert and appears comfortable.  He is a little above  ideal weight.  HEENT:  Nasal airway is a bit edematous but not completely obstructed.  He can breathe through his nose with his mouth closed.  Palate spacing 2-  3/4.  There is no stridor or thyromegaly.  HEART:  Regular rhythm.  No murmur or gallop.  CHEST:  Quiet, clear lung fields.  EXTREMITIES:  No cyanosis, clubbing, tremor, or restlessness.  No  peripheral edema.  NEUROLOGIC:  Unremarkable to observation.   IMPRESSION:  1. Obstructive sleep apnea, inadequately controlled by description.  2. Hypertension.   PLAN:  Basics of sleep apnea and available treatments were reviewed.  We  are scheduling a split protocol nocturnal polysomnogram at the [facility]  system sleep center.  He will return after that is completed.  We will  be encouraging him to get a primary physician to help him with his blood  pressure management.     Clinton D. Maple Hudson, MD, [facility], FACP  Electronically Signed    CDY/MedQ  DD: 10/31/2006  DT: 11/01/2006  Job #: [id number]   cc:   [facility]

## 2010-12-08 ENCOUNTER — Telehealth: Payer: Self-pay | Admitting: Internal Medicine

## 2010-12-08 NOTE — Telephone Encounter (Signed)
Pt has appt on 6.12.12. Pt states he has to do lab work before appt. I'm not sure which exact labs pt needs. Please order labs. Pt will be going to [facility] lab one week prior to appt

## 2010-12-10 NOTE — Telephone Encounter (Signed)
Lab orders entered for Evan Best week prior to patients appointment

## 2010-12-13 ENCOUNTER — Ambulatory Visit: Payer: Self-pay | Admitting: Internal Medicine

## 2010-12-14 ENCOUNTER — Other Ambulatory Visit (INDEPENDENT_AMBULATORY_CARE_PROVIDER_SITE_OTHER): Payer: Self-pay

## 2010-12-14 DIAGNOSIS — R7309 Other abnormal glucose: Secondary | ICD-10-CM

## 2010-12-14 DIAGNOSIS — I10 Essential (primary) hypertension: Secondary | ICD-10-CM

## 2010-12-14 DIAGNOSIS — E785 Hyperlipidemia, unspecified: Secondary | ICD-10-CM

## 2010-12-14 LAB — LIPID PANEL
Cholesterol: 109 mg/dL (ref 0–200)
VLDL: 16.6 mg/dL (ref 0.0–40.0)

## 2010-12-14 LAB — BASIC METABOLIC PANEL
GFR: 66.5 mL/min (ref >60.00)
Glucose, Bld: 130 mg/dL — ABNORMAL HIGH (ref 70–99)
Potassium: 4.1 mEq/L (ref 3.5–5.1)
Sodium: 140 mEq/L (ref 135–145)

## 2010-12-14 LAB — AST: AST: 27 U/L (ref 0–37)

## 2010-12-14 LAB — HEMOGLOBIN A1C: Hgb A1c MFr Bld: 5.3 % (ref 4.6–6.5)

## 2010-12-14 LAB — ALT: ALT: 39 U/L (ref 0–53)

## 2010-12-15 ENCOUNTER — Encounter: Payer: Self-pay | Admitting: Internal Medicine

## 2010-12-15 ENCOUNTER — Other Ambulatory Visit: Payer: Self-pay | Admitting: Internal Medicine

## 2010-12-15 DIAGNOSIS — E785 Hyperlipidemia, unspecified: Secondary | ICD-10-CM

## 2010-12-15 DIAGNOSIS — I10 Essential (primary) hypertension: Secondary | ICD-10-CM

## 2010-12-15 DIAGNOSIS — R7309 Other abnormal glucose: Secondary | ICD-10-CM

## 2010-12-21 ENCOUNTER — Ambulatory Visit: Payer: Self-pay | Admitting: Internal Medicine

## 2010-12-21 ENCOUNTER — Encounter: Payer: Self-pay | Admitting: Family Medicine

## 2010-12-21 ENCOUNTER — Ambulatory Visit (INDEPENDENT_AMBULATORY_CARE_PROVIDER_SITE_OTHER): Payer: PRIVATE HEALTH INSURANCE | Admitting: Family Medicine

## 2010-12-21 ENCOUNTER — Other Ambulatory Visit: Payer: Self-pay | Admitting: Family Medicine

## 2010-12-21 VITALS — BP 122/70 | HR 65 | Temp 97.8°F | Resp 18 | Ht 71.0 in | Wt 214.0 lb

## 2010-12-21 DIAGNOSIS — E785 Hyperlipidemia, unspecified: Secondary | ICD-10-CM

## 2010-12-21 DIAGNOSIS — IMO0001 Reserved for inherently not codable concepts without codable children: Secondary | ICD-10-CM

## 2010-12-21 DIAGNOSIS — M791 Myalgia, unspecified site: Secondary | ICD-10-CM

## 2010-12-22 DIAGNOSIS — M791 Myalgia, unspecified site: Secondary | ICD-10-CM | POA: Insufficient documentation

## 2010-12-22 LAB — TSH: TSH: 1.248 u[IU]/mL (ref 0.350–4.500)

## 2010-12-22 LAB — CK: Total CK: 80 U/L (ref 7–232)

## 2010-12-22 NOTE — Progress Notes (Signed)
OFFICE NOTE  01/04/2011  CC:  Chief Complaint  Patient presents with  . Hypertension  . Hyperlipidemia     HPI:   Patient is a 60 y.o. Caucasian male who is here for follow up statin induced myalgias. Has been on simvastatin approx 2 yrs, began to note vague intermittent cramping-type sensations in the region of back of knee bilat, sometimes in hamstrings and sometimes in calves.  Otherwise was feeling fine.  Recently has tried decreasing simvastatin dose by 50% and adding coQ10, but he notes no difference with this.  He remains active, works out, says this is mainly a nuisance, not severe.    Pertinent PMH:  Hyperchol HTN Borderline DM 2 OSA  MEDS;   Outpatient Prescriptions Prior to Visit  Medication Sig Dispense Refill  . amLODipine (NORVASC) 10 MG tablet Take 1 tablet (10 mg total) by mouth daily.  90 tablet  1  . Calcium-Magnesium-Zinc 1000-400-15 MG TABS Take by mouth daily.        . Chelated Potassium 99 MG TABS Take 99 mg by mouth daily.        . Cinnamon 500 MG capsule Take 500 mg by mouth daily.        . Cyanocobalamin (VITAMIN B 12) 250 MCG LOZG Take 250 mcg by mouth 2 (two) times a week.        Marland Kitchen glucose blood (ONE TOUCH TEST STRIPS) test strip Use as instructed to check blood sugar up to three times per day       . lisinopril (PRINIVIL,ZESTRIL) 40 MG tablet Take 1 tablet (40 mg total) by mouth daily.  30 tablet  3  . Omega-3 Fatty Acids (FISH OIL PO) Take 1,500 mg by mouth daily.        . ONE TOUCH LANCETS MISC Use as instructed to check blood sugar for up to three times per day       . vitamin C (ASCORBIC ACID) 500 MG tablet Take 500 mg by mouth 2 (two) times daily.          PE: Blood pressure 122/70, pulse 65, temperature 97.8 F (36.6 C), temperature source Oral, resp. rate 18, height 5\' 11"  (1.803 m), weight 214 lb (97.07 kg), SpO2 97.00%. Gen: Alert, well appearing.  Patient is oriented to person, place, time, and situation. LEGS: no erythema, tenderness, or  swelling.  No palpable spasms.  Strength 5/5 prox and dist bilat.  DTRs 1+ in patellar and achilles areas bilat.  IMPRESSION AND PLAN:  Myalgia To completely confirm that this is statin related, I recommended he do a trial of at least a month OFF of simvastatin altogether. Will go ahead and check CPK total as well as TSH for completeness.   F/u 18mo.  HYPERLIPIDEMIA Lab Results  Component Value Date   CHOL 109 12/14/2010   CHOL 113 06/10/2010   CHOL 96 10/08/2008   Lab Results  Component Value Date   HDL 29.40* 12/14/2010   HDL 14.78* 06/10/2010   HDL 27.30* 10/08/2008   Lab Results  Component Value Date   LDLCALC 63 12/14/2010   LDLCALC 72 06/10/2010   LDLCALC 54 10/08/2008   Lab Results  Component Value Date   TRIG 83.0 12/14/2010   TRIG 60.0 06/10/2010   TRIG 72.0 10/08/2008   Lab Results  Component Value Date   CHOLHDL 4 12/14/2010   CHOLHDL 4 06/10/2010   CHOLHDL 4 10/08/2008   No results found for this basename: LDLDIRECT   Stable, even with  cutting statin dose by 50% lately. He'll go OFF of statin altogether.     FOLLOW UP:  Return in about 2 months (around 02/20/2011).

## 2010-12-22 NOTE — Assessment & Plan Note (Signed)
To completely confirm that this is statin related, I recommended he do a trial of at least a month OFF of simvastatin altogether. Will go ahead and check CPK total as well as TSH for completeness.   F/u 53mo.

## 2011-01-04 NOTE — Assessment & Plan Note (Signed)
Lab Results  Component Value Date   CHOL 109 12/14/2010   CHOL 113 06/10/2010   CHOL 96 10/08/2008   Lab Results  Component Value Date   HDL 29.40* 12/14/2010   HDL 04.54* 06/10/2010   HDL 27.30* 10/08/2008   Lab Results  Component Value Date   LDLCALC 63 12/14/2010   LDLCALC 72 06/10/2010   LDLCALC 54 10/08/2008   Lab Results  Component Value Date   TRIG 83.0 12/14/2010   TRIG 60.0 06/10/2010   TRIG 72.0 10/08/2008   Lab Results  Component Value Date   CHOLHDL 4 12/14/2010   CHOLHDL 4 06/10/2010   CHOLHDL 4 10/08/2008   No results found for this basename: LDLDIRECT   Stable, even with cutting statin dose by 50% lately. He'll go OFF of statin altogether.

## 2011-02-22 ENCOUNTER — Ambulatory Visit: Payer: PRIVATE HEALTH INSURANCE | Admitting: Internal Medicine

## 2011-02-22 ENCOUNTER — Encounter: Payer: Self-pay | Admitting: Internal Medicine

## 2011-02-22 ENCOUNTER — Ambulatory Visit (INDEPENDENT_AMBULATORY_CARE_PROVIDER_SITE_OTHER): Payer: PRIVATE HEALTH INSURANCE | Admitting: Internal Medicine

## 2011-02-22 DIAGNOSIS — E119 Type 2 diabetes mellitus without complications: Secondary | ICD-10-CM

## 2011-02-22 DIAGNOSIS — E785 Hyperlipidemia, unspecified: Secondary | ICD-10-CM

## 2011-02-22 DIAGNOSIS — R7309 Other abnormal glucose: Secondary | ICD-10-CM

## 2011-02-22 DIAGNOSIS — I10 Essential (primary) hypertension: Secondary | ICD-10-CM

## 2011-02-22 MED ORDER — AMLODIPINE BESYLATE 10 MG PO TABS: 10.0000 mg | ORAL_TABLET | Freq: Every day | ORAL | Status: DC

## 2011-02-22 MED ORDER — LISINOPRIL 40 MG PO TABS: 40.0000 mg | ORAL_TABLET | Freq: Every day | ORAL | Status: DC

## 2011-02-22 NOTE — Patient Instructions (Signed)
Please schedule [facility] labs cbc, chem7, a1c 250.0 and lipid/lft 272.4 prior to next visit

## 2011-02-23 NOTE — Assessment & Plan Note (Signed)
Hold statin for approximately 3 weeks to determine if muscle pain related. It does not improve resume medication and repeat fasting lipid profile and liver function tests prior to next visit.

## 2011-02-23 NOTE — Progress Notes (Signed)
  Subjective:    Patient ID: Evan Best, male    DOB: [birth year], 60 y.o.   MRN: [id number]  HPI patient presents to clinic for followup of multiple medical problems. Has a history of hyperlipidemia with possible association with bilateral upper leg pain and myalgias. Pain is not improving with trainer and stretching. Reviewed normal CK enzyme. History of hypertension and blood pressure under average control. No leg swelling with Norvasc. No alleviating or exacerbating factors. No other complaints.  Reviewed past medical history, medications and allergies   Review of Systems see history of present illness     Objective:   Physical Exam  Physical Exam  Vitals reviewed. Constitutional:  appears well-developed and well-nourished. No distress.  HENT:  Neck: No carotid bruits Head: Normocephalic and atraumatic.  Nose: Nose normal.  Mouth/Throat: Oropharynx is clear and moist. No oropharyngeal exudate.  Eyes: Conjunctivae and EOM are normal. Pupils are equal, round, and reactive to light. Right eye exhibits no discharge. Left eye exhibits no discharge. No scleral icterus.  Neck: Neck supple. No thyromegaly present.  Cardiovascular: Normal rate, regular rhythm and normal heart sounds.  Exam reveals no gallop and no friction rub.   No murmur heard. Pulmonary/Chest: Effort normal and breath sounds normal. No respiratory distress.  has no wheezes.  has no rales.  Lymphadenopathy:   no cervical adenopathy.  Neurological:  is alert.  Skin: Skin is warm and dry.  not diaphoretic.  Psychiatric: normal mood and affect.  Muscle skeletal: No reproducible tenderness of the muscles. Gait normal     Assessment & Plan:

## 2011-02-23 NOTE — Assessment & Plan Note (Signed)
Average control. Continue current regimen. 

## 2011-02-23 NOTE — Assessment & Plan Note (Signed)
History of good control. Continue dietary modification. Obtain CBC Chem-7 and A1c prior to next visit

## 2011-05-13 ENCOUNTER — Encounter: Payer: Self-pay | Admitting: Internal Medicine

## 2011-05-13 ENCOUNTER — Ambulatory Visit (INDEPENDENT_AMBULATORY_CARE_PROVIDER_SITE_OTHER): Payer: PRIVATE HEALTH INSURANCE | Admitting: Internal Medicine

## 2011-05-13 VITALS — BP 150/90 | HR 67 | Ht 71.0 in | Wt 219.6 lb

## 2011-05-13 DIAGNOSIS — Z23 Encounter for immunization: Secondary | ICD-10-CM

## 2011-05-13 DIAGNOSIS — R059 Cough, unspecified: Secondary | ICD-10-CM

## 2011-05-13 DIAGNOSIS — G4733 Obstructive sleep apnea (adult) (pediatric): Secondary | ICD-10-CM

## 2011-05-13 DIAGNOSIS — R05 Cough: Secondary | ICD-10-CM

## 2011-05-13 NOTE — Patient Instructions (Signed)
[facility]  [facility]     Dx OSA        Please call as needed

## 2011-05-13 NOTE — Progress Notes (Signed)
05/13/11- 60 year old male never smoker followed for obstructive sleep apnea, allergic rhinitis, complicated by HBP, DM LOV- 05/14/2010 He has been compliant with CPAP but feels it is not working quite as well. He is waking up more often over the last month or 2 and feels a little tired her during the day. He is not snoring through the current pressure of 7. Aware of some cough and nasal congestion with increased indigestion. His weight has gone up about 6 pounds..  ROS-see HPI Constitutional:   No-   weight loss, night sweats, fevers, chills, fatigue, lassitude. HEENT:   No-  headaches, difficulty swallowing, tooth/dental problems, sore throat,       No-  sneezing, itching, ear ache, nasal congestion, post nasal drip,  CV:  No-   chest pain, orthopnea, PND, swelling in lower extremities, anasarca, dizziness, palpitations Resp: No-   shortness of breath with exertion or at rest.              No-   productive cough,  No non-productive cough,  No- coughing up of blood.              No-   change in color of mucus.  No- wheezing.   Skin: No-   rash or lesions. GI:  No-   heartburn, indigestion, abdominal pain, nausea, vomiting, diarrhea,                 change in bowel habits, loss of appetite GU: No-   dysuria, change in color of urine, no urgency or frequency.  No- flank pain. MS:  No-   joint pain or swelling.  No- decreased range of motion.  No- back pain. Neuro-     nothing unusual Psych:  No- change in mood or affect. No depression or anxiety.  No memory loss.  OBJ General- Alert, Oriented, Affect-appropriate, Distress- none acute Skin- rash-none, lesions- none, excoriation- none Lymphadenopathy- none Head- atraumatic            Eyes- Gross vision intact, PERRLA, conjunctivae clear secretions            Ears- Hearing, canals-normal            Nose- Clear, no-Septal dev, mucus, polyps, erosion, perforation             Throat- Mallampati II , +mucosa red and cobblestoned , drainage- none,  tonsils- atrophic Neck- flexible , trachea midline, no stridor , thyroid nl, carotid no bruit Chest - symmetrical excursion , unlabored           Heart/CV- RRR , no murmur , no gallop  , no rub, nl s1 s2                           - JVD- none , edema- none, stasis changes- none, varices- none           Lung- clear to P&A, wheeze- none, cough- none , dullness-none, rub- none           Chest wall-  Abd- tender-no, distended-no, bowel sounds-present, HSM- no Br/ Gen/ Rectal- Not done, not indicated Extrem- cyanosis- none, clubbing, none, atrophy- none, strength- nl Neuro- grossly intact to observation

## 2011-05-14 NOTE — Assessment & Plan Note (Signed)
His, suggest the possibility that increased cough partly is to do reflux. Reflux precautions and use of OTC medication was discussed. Flu vaccine.

## 2011-05-14 NOTE — Assessment & Plan Note (Signed)
Good compliance and fairly good control. If his sense that he is waking more during the night reflects his CPAP, but may be able to help by increasing the pressure one step. Plan-increase CPAP to 8

## 2011-06-13 ENCOUNTER — Other Ambulatory Visit: Payer: PRIVATE HEALTH INSURANCE

## 2011-06-14 ENCOUNTER — Ambulatory Visit: Payer: PRIVATE HEALTH INSURANCE

## 2011-06-14 DIAGNOSIS — E785 Hyperlipidemia, unspecified: Secondary | ICD-10-CM

## 2011-06-14 LAB — CBC WITH DIFFERENTIAL/PLATELET
Basophils Absolute: 0 10*3/uL (ref 0.0–0.1)
Lymphocytes Relative: 24.9 % (ref 12.0–46.0)
Monocytes Relative: 12 % (ref 3.0–12.0)
Neutrophils Relative %: 60.2 % (ref 43.0–77.0)
Platelets: 218 10*3/uL (ref 150.0–400.0)
RDW: 13.2 % (ref 11.5–14.6)
WBC: 7.3 10*3/uL (ref 4.5–10.5)

## 2011-06-14 LAB — BASIC METABOLIC PANEL
CO2: 26 mEq/L (ref 19–32)
Calcium: 8.8 mg/dL (ref 8.4–10.5)
Chloride: 107 mEq/L (ref 96–112)
Potassium: 3.7 mEq/L (ref 3.5–5.1)
Sodium: 141 mEq/L (ref 135–145)

## 2011-06-14 LAB — LIPID PANEL
Cholesterol: 113 mg/dL (ref 0–200)
HDL: 29.3 mg/dL — ABNORMAL LOW (ref >39.00)
LDL Cholesterol: 64 mg/dL (ref 0–99)
Triglycerides: 100 mg/dL (ref 0.0–149.0)
VLDL: 20 mg/dL (ref 0.0–40.0)

## 2011-06-14 LAB — HEPATIC FUNCTION PANEL
ALT: 32 U/L (ref 0–53)
AST: 31 U/L (ref 0–37)
Alkaline Phosphatase: 53 U/L (ref 39–117)
Bilirubin, Direct: 0.2 mg/dL (ref 0.0–0.3)
Total Bilirubin: 1.1 mg/dL (ref 0.3–1.2)
Total Protein: 7.6 g/dL (ref 6.0–8.3)

## 2011-06-21 ENCOUNTER — Encounter: Payer: Self-pay | Admitting: Internal Medicine

## 2011-06-21 ENCOUNTER — Ambulatory Visit (INDEPENDENT_AMBULATORY_CARE_PROVIDER_SITE_OTHER): Payer: PRIVATE HEALTH INSURANCE | Admitting: Internal Medicine

## 2011-06-21 VITALS — BP 140/80 | HR 62 | Temp 97.9°F | Resp 16 | Wt 216.0 lb

## 2011-06-21 DIAGNOSIS — R7309 Other abnormal glucose: Secondary | ICD-10-CM

## 2011-06-21 DIAGNOSIS — E119 Type 2 diabetes mellitus without complications: Secondary | ICD-10-CM

## 2011-06-21 DIAGNOSIS — I10 Essential (primary) hypertension: Secondary | ICD-10-CM

## 2011-06-21 DIAGNOSIS — Z23 Encounter for immunization: Secondary | ICD-10-CM

## 2011-06-21 DIAGNOSIS — Z1211 Encounter for screening for malignant neoplasm of colon: Secondary | ICD-10-CM

## 2011-06-21 DIAGNOSIS — E785 Hyperlipidemia, unspecified: Secondary | ICD-10-CM

## 2011-06-21 NOTE — Assessment & Plan Note (Signed)
Resume regular exercise for low hdl.

## 2011-06-21 NOTE — Assessment & Plan Note (Signed)
Borderline elevation. Recommend outpt bp log to be subtmitted for review

## 2011-06-21 NOTE — Assessment & Plan Note (Signed)
Resume periodic fsbs monitoring. a1c nl but intermittent glucose elevations on chem7. Low sugar/carb diet and exercise

## 2011-06-21 NOTE — Progress Notes (Signed)
  Subjective:    Patient ID: Evan Best, male    DOB: 16-Aug-1950, 60 y.o.   MRN: [id number]  HPI Pt presents to clinic for followup of multiple medical problems. Has mild intermittent neuropathy of feet. A1c remains nl but chem7 glucoses mildly elevated. No recent fsbs checks. Not recently exercising. Leg pains didn't resolve with holding statin. Willing to schedule screening colonoscopy. No other complaints.  Past Medical History  Diagnosis Date  . OSA (obstructive sleep apnea)   . Hypertension   . Migraine headache     remote history  . Diabetes mellitus type II 1/09   Past Surgical History  Procedure Date  . Tonsillectomy 1959  . Hernia repair 1998    umbilical hernia repair  . Knee surgery     left knee surgery  . Vasectomy     reports that he has never smoked. He has never used smokeless tobacco. He reports that he does not drink alcohol or use illicit drugs. family history includes Breast cancer in his mother; Hyperlipidemia in his father; and Hypertension in his mother. No Known Allergies   Review of Systems see hpi    Objective:   Physical Exam  Physical Exam  Nursing note and vitals reviewed. Constitutional: Appears well-developed and well-nourished. No distress.  HENT:  Head: Normocephalic and atraumatic.  Right Ear: External ear normal.  Left Ear: External ear normal.  Eyes: Conjunctivae are normal. No scleral icterus.  Neck: Neck supple. Carotid bruit is not present.  Cardiovascular: Normal rate, regular rhythm and normal heart sounds.  Exam reveals no gallop and no friction rub.   No murmur heard. Pulmonary/Chest: Effort normal and breath sounds normal. No respiratory distress. He has no wheezes. no rales.  Lymphadenopathy:    He has no cervical adenopathy.  Neurological:Alert.  Skin: Skin is warm and dry. Not diaphoretic.  Psychiatric: Has a normal mood and affect.   Diabetic foot exam: +2 DP pulses, no diabetic wounds, ulcerations or significant  callousing. Monofilament exam nl.      Assessment & Plan:

## 2011-07-13 ENCOUNTER — Ambulatory Visit (AMBULATORY_SURGERY_CENTER): Payer: PRIVATE HEALTH INSURANCE | Admitting: *Deleted

## 2011-07-13 ENCOUNTER — Telehealth: Payer: Self-pay | Admitting: Internal Medicine

## 2011-07-13 VITALS — Ht 71.0 in | Wt 214.3 lb

## 2011-07-13 DIAGNOSIS — Z1211 Encounter for screening for malignant neoplasm of colon: Secondary | ICD-10-CM

## 2011-07-13 MED ORDER — SIMVASTATIN 10 MG PO TABS: 10.0000 mg | ORAL_TABLET | Freq: Every day | ORAL | Status: DC

## 2011-07-13 MED ORDER — PEG-KCL-NACL-NASULF-NA ASC-C 100 G PO SOLR: ORAL | Status: DC

## 2011-07-13 NOTE — Telephone Encounter (Signed)
90 day supply x 1 refill sent to pharmacy. 

## 2011-07-13 NOTE — Telephone Encounter (Signed)
Patient states that he would like a 90 day refill of simvastatin sent to [facility] on [address]

## 2011-07-27 ENCOUNTER — Encounter: Payer: Self-pay | Admitting: Gastroenterology

## 2011-07-27 ENCOUNTER — Ambulatory Visit (AMBULATORY_SURGERY_CENTER): Payer: PRIVATE HEALTH INSURANCE | Admitting: Gastroenterology

## 2011-07-27 DIAGNOSIS — Z1211 Encounter for screening for malignant neoplasm of colon: Secondary | ICD-10-CM

## 2011-07-27 DIAGNOSIS — D126 Benign neoplasm of colon, unspecified: Secondary | ICD-10-CM

## 2011-07-27 MED ORDER — SODIUM CHLORIDE 0.9 % IV SOLN: 500.0000 mL | INTRAVENOUS | Status: DC

## 2011-07-27 NOTE — Progress Notes (Signed)
Patient did not experience any of the following events: a burn prior to discharge; a fall within the facility; wrong site/side/patient/procedure/implant event; or a hospital transfer or hospital admission upon discharge from the facility. (G8907) Patient did not have preoperative order for IV antibiotic SSI prophylaxis. (G8918)  

## 2011-07-27 NOTE — Op Note (Signed)
[facility] [address] [place], [place]  [address]  COLONOSCOPY PROCEDURE REPORT  PATIENT:  Evan Best, Evan Best  MR#:  [id number] BIRTHDATE:  1951-03-25, 60 yrs. old  GENDER:  male ENDOSCOPIST:  Rachael Fee, MD REF. BY:  Charlynn Court, M.D. PROCEDURE DATE:  07/27/2011 PROCEDURE:  Colonoscopy with snare polypectomy ASA CLASS:  Class II INDICATIONS:  Routine Risk Screening MEDICATIONS:   Fentanyl 50 mcg IV, These medications were titrated to patient response per physician's verbal order, Versed 6 mg IV  DESCRIPTION OF PROCEDURE:   After the risks benefits and alternatives of the procedure were thoroughly explained, informed consent was obtained.  Digital rectal exam was performed and revealed no rectal masses.   The LB [id number] endoscope was introduced through the anus and advanced to the cecum, which was identified by both the appendix and ileocecal valve, without limitations.  The quality of the prep was good..  The instrument was then slowly withdrawn as the colon was fully examined. <<PROCEDUREIMAGES>> FINDINGS:  A sessile polyp was found in the ascending colon. This was 3mm across, removed with cold snare and sent to pathology (jar 1) (see image6).  This was otherwise a normal examination of the colon (see image7, image3, image5, and image4).   Retroflexed views in the rectum revealed no abnormalities. COMPLICATIONS:  None  ENDOSCOPIC IMPRESSION: 1) Sessile polyp in the ascending colon; removed and sent to pathology 2) Otherwise normal examination  RECOMMENDATIONS: 1) If the polyp(s) removed today are proven to be adenomatous (pre-cancerous) polyps, you will need a repeat colonoscopy in 5 years. Otherwise you should continue to follow colorectal cancer screening guidelines for "routine risk" patients with colonoscopy in 10 years. 2) You will receive a letter within 1-2 weeks with the results of your biopsy as well as final recommendations. Please  call my office if you have not received a letter after 3 weeks.  ______________________________ Rachael Fee, MD  n. eSIGNED:   Rachael Fee at 07/27/2011 10:08 AM  Billey Gosling, [phone]

## 2011-07-27 NOTE — Progress Notes (Signed)
The pt tolerated the colonoscopy very well. maw 

## 2011-07-27 NOTE — Patient Instructions (Signed)
FOLLOW DISCHARGE INSTRUCTIONS (BLUE AND GREEN SHEETS).  INSTRUCTION SHEET FOR POLYPS GIVEN

## 2011-07-28 ENCOUNTER — Telehealth: Payer: Self-pay

## 2011-07-28 NOTE — Telephone Encounter (Signed)

## 2011-08-02 ENCOUNTER — Encounter: Payer: Self-pay | Admitting: Gastroenterology

## 2011-08-23 ENCOUNTER — Telehealth: Payer: Self-pay | Admitting: Internal Medicine

## 2011-08-23 MED ORDER — LISINOPRIL 40 MG PO TABS: 40.0000 mg | ORAL_TABLET | Freq: Every day | ORAL | Status: DC

## 2011-08-23 NOTE — Telephone Encounter (Signed)
Rx refill sent to pharmacy. 

## 2011-10-18 ENCOUNTER — Encounter: Payer: Self-pay | Admitting: Internal Medicine

## 2011-10-18 ENCOUNTER — Ambulatory Visit (INDEPENDENT_AMBULATORY_CARE_PROVIDER_SITE_OTHER): Payer: PRIVATE HEALTH INSURANCE | Admitting: Internal Medicine

## 2011-10-18 VITALS — BP 112/72 | HR 68 | Temp 98.1°F | Resp 18 | Ht 71.0 in | Wt 215.0 lb

## 2011-10-18 DIAGNOSIS — I10 Essential (primary) hypertension: Secondary | ICD-10-CM

## 2011-10-18 DIAGNOSIS — E785 Hyperlipidemia, unspecified: Secondary | ICD-10-CM

## 2011-10-18 DIAGNOSIS — R7309 Other abnormal glucose: Secondary | ICD-10-CM

## 2011-10-18 NOTE — Patient Instructions (Signed)
Please schedule fasting labs prior to next visit Chem7, a1c- hyperglycemia, lipid/lft-272.4 and psa- prostate cancer screening

## 2011-10-18 NOTE — Progress Notes (Signed)
  Subjective:    Patient ID: Evan Best, male    DOB: 02-04-1951, 61 y.o.   MRN: [id number]  HPI Pt presents to clinic for followup of multiple medical problems. Tolerating statin tx without adverse effect. bp reviewed normotensive. Remains with good glycemic control. No active complaint.   Past Medical History  Diagnosis Date  . OSA (obstructive sleep apnea)   . Hypertension   . Migraine headache     remote history  . Diabetes mellitus type II 1/09   Past Surgical History  Procedure Date  . Tonsillectomy 1959  . Hernia repair 1998    umbilical hernia repair  . Knee surgery     left knee surgery  . Vasectomy     reports that he has never smoked. He has never used smokeless tobacco. He reports that he does not drink alcohol or use illicit drugs. family history includes Breast cancer in his mother; Hyperlipidemia in his father; and Hypertension in his mother.  There is no history of Colon cancer and Stomach cancer. No Known Allergies   Review of Systems see hpi     Objective:   Physical Exam  Physical Exam  Nursing note and vitals reviewed. Constitutional: Appears well-developed and well-nourished. No distress.  HENT:  Head: Normocephalic and atraumatic.  Right Ear: External ear normal.  Left Ear: External ear normal.  Eyes: Conjunctivae are normal. No scleral icterus.  Neck: Neck supple. Carotid bruit is not present.  Cardiovascular: Normal rate, regular rhythm and normal heart sounds.  Exam reveals no gallop and no friction rub.   No murmur heard. Pulmonary/Chest: Effort normal and breath sounds normal. No respiratory distress. He has no wheezes. no rales.  Lymphadenopathy:    He has no cervical adenopathy.  Neurological:Alert.  Skin: Skin is warm and dry. Not diaphoretic.  Psychiatric: Has a normal mood and affect.        Assessment & Plan:

## 2011-10-18 NOTE — Assessment & Plan Note (Signed)
Stable. Obtain lipid/lft prior to next visit. 

## 2011-10-18 NOTE — Assessment & Plan Note (Signed)
Normotensive and stable. Continue current regimen. Monitor bp as outpt and followup in clinic as scheduled.  

## 2011-10-18 NOTE — Assessment & Plan Note (Signed)
Stable. Obtain chem7, a1c prior to next visit

## 2011-10-26 ENCOUNTER — Telehealth: Payer: Self-pay | Admitting: Internal Medicine

## 2011-10-26 MED ORDER — AMLODIPINE BESYLATE 10 MG PO TABS: 10.0000 mg | ORAL_TABLET | Freq: Every day | ORAL | Status: DC

## 2011-10-26 NOTE — Telephone Encounter (Signed)
Rx refill sent to pharmacy. 

## 2011-10-26 NOTE — Telephone Encounter (Signed)
Refill- amlodipine 10mg  tab. Take one tablet by mouth daily. Qty 90 last fill 3.13.13

## 2011-12-28 ENCOUNTER — Telehealth: Payer: Self-pay | Admitting: Internal Medicine

## 2011-12-28 MED ORDER — SIMVASTATIN 10 MG PO TABS: 10.0000 mg | ORAL_TABLET | Freq: Every day | ORAL | Status: DC

## 2011-12-28 NOTE — Telephone Encounter (Signed)
Refill- simvastatin 10mg  tab. Take one tablet by mouth at bedtime. Qty 90 last fill 5.13.13

## 2011-12-28 NOTE — Telephone Encounter (Signed)
Refill sent to pharmacy.   

## 2012-02-27 ENCOUNTER — Telehealth: Payer: Self-pay | Admitting: Internal Medicine

## 2012-02-27 MED ORDER — LISINOPRIL 40 MG PO TABS: 40.0000 mg | ORAL_TABLET | Freq: Every day | ORAL | Status: DC

## 2012-02-27 NOTE — Telephone Encounter (Signed)
Refill- lisinopril 40mg tab. Take one tablet by mouth daily. Qty 90 last fill 5.13.13 °

## 2012-02-27 NOTE — Telephone Encounter (Signed)
Sent renewal back to [facility]... 02/27/12@3 :23pm/LMB

## 2012-03-15 ENCOUNTER — Other Ambulatory Visit (INDEPENDENT_AMBULATORY_CARE_PROVIDER_SITE_OTHER): Payer: PRIVATE HEALTH INSURANCE

## 2012-03-15 ENCOUNTER — Telehealth: Payer: Self-pay | Admitting: *Deleted

## 2012-03-15 DIAGNOSIS — Z125 Encounter for screening for malignant neoplasm of prostate: Secondary | ICD-10-CM

## 2012-03-15 DIAGNOSIS — R7309 Other abnormal glucose: Secondary | ICD-10-CM

## 2012-03-15 DIAGNOSIS — E785 Hyperlipidemia, unspecified: Secondary | ICD-10-CM

## 2012-03-15 LAB — HEPATIC FUNCTION PANEL
Alkaline Phosphatase: 48 U/L (ref 39–117)
Bilirubin, Direct: 0.2 mg/dL (ref 0.0–0.3)
Total Bilirubin: 1.2 mg/dL (ref 0.3–1.2)

## 2012-03-15 LAB — BASIC METABOLIC PANEL
BUN: 21 mg/dL (ref 6–23)
Chloride: 106 mEq/L (ref 96–112)
Glucose, Bld: 117 mg/dL — ABNORMAL HIGH (ref 70–99)
Potassium: 3.5 mEq/L (ref 3.5–5.1)
Sodium: 139 mEq/L (ref 135–145)

## 2012-03-15 LAB — LIPID PANEL
HDL: 27.8 mg/dL — ABNORMAL LOW (ref >39.00)
LDL Cholesterol: 51 mg/dL (ref 0–99)
Total CHOL/HDL Ratio: 3
Triglycerides: 92 mg/dL (ref 0.0–149.0)
VLDL: 18.4 mg/dL (ref 0.0–40.0)

## 2012-03-15 LAB — HEMOGLOBIN A1C: Hgb A1c MFr Bld: 5.1 % (ref 4.6–6.5)

## 2012-03-15 NOTE — Telephone Encounter (Signed)
Pt presented to the lab, future orders entered as below:  Please schedule fasting labs prior to next visit  Chem7, a1c- hyperglycemia, lipid/lft-272.4 and psa- prostate cancer screening

## 2012-03-20 ENCOUNTER — Ambulatory Visit (INDEPENDENT_AMBULATORY_CARE_PROVIDER_SITE_OTHER): Payer: Self-pay | Admitting: Internal Medicine

## 2012-03-20 ENCOUNTER — Telehealth: Payer: Self-pay | Admitting: Internal Medicine

## 2012-03-20 ENCOUNTER — Encounter: Payer: Self-pay | Admitting: Internal Medicine

## 2012-03-20 VITALS — BP 118/62 | HR 75 | Temp 97.8°F | Resp 16 | Wt 214.5 lb

## 2012-03-20 DIAGNOSIS — M6530 Trigger finger, unspecified finger: Secondary | ICD-10-CM

## 2012-03-20 DIAGNOSIS — I10 Essential (primary) hypertension: Secondary | ICD-10-CM

## 2012-03-20 NOTE — Patient Instructions (Signed)
Please schedule fasting labs prior to next visit Cbc, tsh-401.9, chem7-v58.69, a1c-hyperglycemia and lipid/lft-272.4

## 2012-03-24 DIAGNOSIS — M6530 Trigger finger, unspecified finger: Secondary | ICD-10-CM | POA: Insufficient documentation

## 2012-03-24 NOTE — Assessment & Plan Note (Signed)
Progressively more symptomatic. Discussed referral for possible injection. Currently defers.

## 2012-03-24 NOTE — Progress Notes (Signed)
  Subjective:    Patient ID: Evan Best, male    DOB: 1950-11-17, 61 y.o.   MRN: [id number]  HPI Pt presents to clinic for followup of multiple medical problems. Has left fourth trigger finger which is worsening mildly. No associated pain. Weight stable. Blood pressure reviewed as normotensive. Has bilateral knee arthritis using glucosamine which helps some. No other complaints  Past Medical History  Diagnosis Date  . OSA (obstructive sleep apnea)   . Hypertension   . Migraine headache     remote history  . Diabetes mellitus type II 1/09   Past Surgical History  Procedure Date  . Tonsillectomy 1959  . Hernia repair 1998    umbilical hernia repair  . Knee surgery     left knee surgery  . Vasectomy     reports that he has never smoked. He has never used smokeless tobacco. He reports that he does not drink alcohol or use illicit drugs. family history includes Breast cancer in his mother; Hyperlipidemia in his father; and Hypertension in his mother.  There is no history of Colon cancer and Stomach cancer. No Known Allergies    Review of Systems see hpi     Objective:   Physical Exam  Physical Exam  Nursing note and vitals reviewed. Constitutional: Appears well-developed and well-nourished. No distress.  HENT:  Head: Normocephalic and atraumatic.  Right Ear: External ear normal.  Left Ear: External ear normal.  Eyes: Conjunctivae are normal. No scleral icterus.  Neck: Neck supple. Carotid bruit is not present.  Cardiovascular: Normal rate, regular rhythm and normal heart sounds.  Exam reveals no gallop and no friction rub.   No murmur heard. Pulmonary/Chest: Effort normal and breath sounds normal. No respiratory distress. He has no wheezes. no rales.  Lymphadenopathy:    He has no cervical adenopathy.  Neurological:Alert.  Skin: Skin is warm and dry. Not diaphoretic.  Psychiatric: Has a normal mood and affect.        Assessment & Plan:

## 2012-03-24 NOTE — Assessment & Plan Note (Signed)
Normotensive and stable. Continue current regimen. Monitor bp as outpt and followup in clinic as scheduled.  

## 2012-04-25 ENCOUNTER — Telehealth: Payer: Self-pay | Admitting: Internal Medicine

## 2012-04-25 NOTE — Telephone Encounter (Signed)
Refill- norvasc 10mg  tab. Take one tablet by mouth daily. Qty 90 last fill 7.18.13

## 2012-04-26 MED ORDER — AMLODIPINE BESYLATE 10 MG PO TABS: 10.0000 mg | ORAL_TABLET | Freq: Every day | ORAL | Status: DC

## 2012-04-26 NOTE — Telephone Encounter (Signed)
Refill sent to [phone] x 1 refill.

## 2012-05-11 ENCOUNTER — Ambulatory Visit (INDEPENDENT_AMBULATORY_CARE_PROVIDER_SITE_OTHER): Payer: Self-pay | Admitting: Internal Medicine

## 2012-05-11 ENCOUNTER — Encounter: Payer: Self-pay | Admitting: Internal Medicine

## 2012-05-11 VITALS — BP 130/80 | HR 68 | Ht 71.0 in | Wt 217.4 lb

## 2012-05-11 DIAGNOSIS — G4733 Obstructive sleep apnea (adult) (pediatric): Secondary | ICD-10-CM

## 2012-05-11 DIAGNOSIS — Z23 Encounter for immunization: Secondary | ICD-10-CM

## 2012-05-11 NOTE — Patient Instructions (Addendum)
Please call as needed 

## 2012-05-11 NOTE — Progress Notes (Signed)
05/13/11- 61 year old male never smoker followed for obstructive sleep apnea, allergic rhinitis, complicated by HBP, DM LOV- 05/14/2010 He has been compliant with CPAP but feels it is not working quite as well. He is waking up more often over the last month or 2 and feels a little tired her during the day. He is not snoring through the current pressure of 7. Aware of some cough and nasal congestion with increased indigestion. His weight has gone up about 6 pounds..  ROS-see HPI Constitutional:   No-   weight loss, night sweats, fevers, chills, fatigue, lassitude. HEENT:   No-  headaches, difficulty swallowing, tooth/dental problems, sore throat,       No-  sneezing, itching, ear ache, nasal congestion, post nasal drip,  CV:  No-   chest pain, orthopnea, PND, swelling in lower extremities, anasarca, dizziness, palpitations Resp: No-   shortness of breath with exertion or at rest.              No-   productive cough,  No non-productive cough,  No- coughing up of blood.              No-   change in color of mucus.  No- wheezing.   Skin: No-   rash or lesions. GI:  No-   heartburn, indigestion, abdominal pain, nausea, vomiting, diarrhea,                 change in bowel habits, loss of appetite GU: No-   dysuria, change in color of urine, no urgency or frequency.  No- flank pain. MS:  No-   joint pain or swelling.  No- decreased range of motion.  No- back pain. Neuro-     nothing unusual Psych:  No- change in mood or affect. No depression or anxiety.  No memory loss.  OBJ General- Alert, Oriented, Affect-appropriate, Distress- none acute Skin- rash-none, lesions- none, excoriation- none Lymphadenopathy- none Head- atraumatic            Eyes- Gross vision intact, PERRLA, conjunctivae clear secretions            Ears- Hearing, canals-normal            Nose- Clear, no-Septal dev, mucus, polyps, erosion, perforation             Throat- Mallampati II , +mucosa red and cobblestoned , drainage- none,  tonsils- atrophic Neck- flexible , trachea midline, no stridor , thyroid nl, carotid no bruit Chest - symmetrical excursion , unlabored           Heart/CV- RRR , no murmur , no gallop  , no rub, nl s1 s2                           - JVD- none , edema- none, stasis changes- none, varices- none           Lung- clear to P&A, wheeze- none, cough- none , dullness-none, rub- none           Chest wall-  Abd- tender-no, distended-no, bowel sounds-present, HSM- no Br/ Gen/ Rectal- Not done, not indicated Extrem- cyanosis- none, clubbing, none, atrophy- none, strength- nl Neuro- grossly intact to observation    05/13/11- 61 year old male never smoker followed for obstructive sleep apnea, allergic rhinitis, complicated by HBP, DM LOV- 05/14/2010 He has been compliant with CPAP but feels it is not working quite as well. He is waking up more often over the last month  or 2 and feels a little tired her during the day. He is not snoring through the current pressure of 7. Aware of some cough and nasal congestion with increased indigestion. His weight has gone up about 6 pounds..  65/72- 61 year old male never smoker followed for obstructive sleep apnea, allergic rhinitis, complicated by HBP, DM Wears CPAP 8/ Advanced every night for about 6 hours and pressure doing well. He has no insurance and finances are a problem. He thinks he is doing well and currently denies any problem with rhinitis. Has taken occasional Sudafed if needed  ROS-see HPI Constitutional:   No-   weight loss, night sweats, fevers, chills, fatigue, lassitude. HEENT:   No-  headaches, difficulty swallowing, tooth/dental problems, sore throat,       No-  sneezing, itching, ear ache, +nasal congestion, post nasal drip,  CV:  No-   chest pain, orthopnea, PND, swelling in lower extremities, anasarca, dizziness, palpitations Resp: No-   shortness of breath with exertion or at rest.              No-   productive cough,  No non-productive  cough,  No- coughing up of blood.              No-   change in color of mucus.  No- wheezing.   Skin: No-   rash or lesions. GI:  No-   heartburn, indigestion, abdominal pain, nausea, vomiting,  GU: . MS:  No-   joint pain or swelling.   Neuro-     nothing unusual Psych:  No- change in mood or affect. No depression or anxiety.  No memory loss.  OBJ General- Alert, Oriented, Affect-appropriate, Distress- none acute Skin- rash-none, lesions- none, excoriation- none Lymphadenopathy- none Head- atraumatic            Eyes- Gross vision intact, PERRLA, conjunctivae clear secretions            Ears- Hearing, canals-normal            Nose- Clear, no-Septal dev, mucus, polyps, erosion, perforation             Throat- Mallampati II , +mucosa red and cobblestoned , drainage- none, tonsils- atrophic Neck- flexible , trachea midline, no stridor , thyroid nl, carotid no bruit Chest - symmetrical excursion , unlabored           Heart/CV- RRR , no murmur , no gallop  , no rub, nl s1 s2                           - JVD- none , edema- none, stasis changes- none, varices- none           Lung- clear to P&A, wheeze- none, cough- none , dullness-none, rub- none           Chest wall-  Abd-  Br/ Gen/ Rectal- Not done, not indicated Extrem- cyanosis- none, clubbing, none, atrophy- none, strength- nl Neuro- grossly intact to observation

## 2012-05-21 NOTE — Assessment & Plan Note (Signed)
Good compliance and control. He is satisfied. We will stay on pressure 8. We discussed replacement of his machine when the time comes.

## 2012-07-26 ENCOUNTER — Telehealth: Payer: Self-pay | Admitting: Internal Medicine

## 2012-07-26 MED ORDER — SIMVASTATIN 10 MG PO TABS: 10.0000 mg | ORAL_TABLET | Freq: Every day | ORAL | Status: DC

## 2012-07-26 NOTE — Telephone Encounter (Signed)
Rx to pharmacy/[facility] 

## 2012-07-26 NOTE — Telephone Encounter (Signed)
Refill- zocor 10mg  tab. Take one tablet by mouth at bedtime. Qty 90 last fill 10.16.13

## 2012-08-21 ENCOUNTER — Telehealth: Payer: Self-pay | Admitting: Internal Medicine

## 2012-08-21 MED ORDER — LISINOPRIL 40 MG PO TABS: 40.0000 mg | ORAL_TABLET | Freq: Every day | ORAL | Status: DC

## 2012-08-21 NOTE — Telephone Encounter (Signed)
Refill- lisinopril 40mg  tab. Take one tablet by mouth daily. Qty 90 last fill 11.19.13

## 2012-09-12 ENCOUNTER — Other Ambulatory Visit (INDEPENDENT_AMBULATORY_CARE_PROVIDER_SITE_OTHER): Payer: Self-pay

## 2012-09-12 ENCOUNTER — Other Ambulatory Visit: Payer: Self-pay | Admitting: Family Medicine

## 2012-09-12 DIAGNOSIS — I10 Essential (primary) hypertension: Secondary | ICD-10-CM

## 2012-09-12 DIAGNOSIS — E785 Hyperlipidemia, unspecified: Secondary | ICD-10-CM

## 2012-09-12 DIAGNOSIS — E119 Type 2 diabetes mellitus without complications: Secondary | ICD-10-CM

## 2012-09-12 LAB — LIPID PANEL
LDL Cholesterol: 62 mg/dL (ref 0–99)
Total CHOL/HDL Ratio: 4
VLDL: 17.4 mg/dL (ref 0.0–40.0)

## 2012-09-12 LAB — HEPATIC FUNCTION PANEL
Alkaline Phosphatase: 51 U/L (ref 39–117)
Bilirubin, Direct: 0.2 mg/dL (ref 0.0–0.3)
Total Bilirubin: 0.9 mg/dL (ref 0.3–1.2)
Total Protein: 7.2 g/dL (ref 6.0–8.3)

## 2012-09-12 LAB — RENAL FUNCTION PANEL
Chloride: 105 mEq/L (ref 96–112)
GFR: 74.73 mL/min (ref >60.00)
Glucose, Bld: 131 mg/dL — ABNORMAL HIGH (ref 70–99)
Phosphorus: 2.8 mg/dL (ref 2.3–4.6)
Potassium: 3.7 mEq/L (ref 3.5–5.1)
Sodium: 139 mEq/L (ref 135–145)

## 2012-09-12 LAB — CBC
HCT: 41.7 % (ref 39.0–52.0)
Hemoglobin: 14.5 g/dL (ref 13.0–17.0)
Platelets: 252 10*3/uL (ref 150.0–400.0)
RBC: 4.78 Mil/uL (ref 4.22–5.81)
WBC: 7.7 10*3/uL (ref 4.5–10.5)

## 2012-09-12 LAB — HEMOGLOBIN A1C: Hgb A1c MFr Bld: 5.9 % (ref 4.6–6.5)

## 2012-09-18 ENCOUNTER — Telehealth: Payer: Self-pay | Admitting: Family Medicine

## 2012-09-18 ENCOUNTER — Encounter: Payer: Self-pay | Admitting: Family Medicine

## 2012-09-18 ENCOUNTER — Ambulatory Visit (INDEPENDENT_AMBULATORY_CARE_PROVIDER_SITE_OTHER): Payer: Self-pay | Admitting: Family Medicine

## 2012-09-18 VITALS — BP 152/78 | HR 77 | Temp 97.6°F | Ht 71.0 in | Wt 216.0 lb

## 2012-09-18 DIAGNOSIS — M109 Gout, unspecified: Secondary | ICD-10-CM

## 2012-09-18 DIAGNOSIS — R7309 Other abnormal glucose: Secondary | ICD-10-CM

## 2012-09-18 DIAGNOSIS — G4733 Obstructive sleep apnea (adult) (pediatric): Secondary | ICD-10-CM

## 2012-09-18 HISTORY — DX: Gout, unspecified: M10.9

## 2012-09-18 NOTE — Assessment & Plan Note (Signed)
hgba1c 5.9 has cut back on his carbs and restarted on Metformin temporarily. And now improving

## 2012-09-18 NOTE — Assessment & Plan Note (Addendum)
Increase hydration and NSAIDs as needed. Advil 400mg  po bid helps

## 2012-09-18 NOTE — Telephone Encounter (Signed)
LAB ORDER WEEK OF 9-4 LIPID RENAL CBC HEPATIC HBGA1C MICRO TSH

## 2012-09-18 NOTE — Assessment & Plan Note (Signed)
Mild depression in HDL cholesterol encouraged increaed exercise and consider MegaRed caps daily

## 2012-09-18 NOTE — Assessment & Plan Note (Addendum)
Uses machine regularly.

## 2012-09-18 NOTE — Patient Instructions (Addendum)
  Needs foot exam at next visit  Consider Aspercreme MegaRed krill oil caps daily Labs prior lipid, renal, cbc, hepatic, hgba1c, micro, tsh  Diabetes and Exercise Regular exercise is important and can help:   Control blood glucose (sugar).  Decrease blood pressure.    Control blood lipids (cholesterol, triglycerides).  Improve overall health. BENEFITS FROM EXERCISE  Improved fitness.  Improved flexibility.  Improved endurance.  Increased bone density.  Weight control.  Increased muscle strength.  Decreased body fat.  Improvement of the body's use of insulin, a hormone.  Increased insulin sensitivity.  Reduction of insulin needs.  Reduced stress and tension.  Helps you feel better. People with diabetes who add exercise to their lifestyle gain additional benefits, including:  Weight loss.  Reduced appetite.  Improvement of the body's use of blood glucose.  Decreased risk factors for heart disease:  Lowering of cholesterol and triglycerides.  Raising the level of good cholesterol (high-density lipoproteins, HDL).  Lowering blood sugar.  Decreased blood pressure. TYPE 1 DIABETES AND EXERCISE  Exercise will usually lower your blood glucose.  If blood glucose is greater than 240 mg/dl, check urine ketones. If ketones are present, do not exercise.  Location of the insulin injection sites may need to be adjusted with exercise. Avoid injecting insulin into areas of the body that will be exercised. For example, avoid injecting insulin into:  The arms when playing tennis.  The legs when jogging. For more information, discuss this with your caregiver.  Keep a record of:  Food intake.  Type and amount of exercise.  Expected peak times of insulin action.  Blood glucose levels. Do this before, during, and after exercise. Review your records with your caregiver. This will help you to develop guidelines for adjusting food intake and insulin amounts.   TYPE 2 DIABETES AND EXERCISE  Regular physical activity can help control blood glucose.  Exercise is important because it may:  Increase the body's sensitivity to insulin.  Improve blood glucose control.  Exercise reduces the risk of heart disease. It decreases serum cholesterol and triglycerides. It also lowers blood pressure.  Those who take insulin or oral hypoglycemic agents should watch for signs of hypoglycemia. These signs include dizziness, shaking, sweating, chills, and confusion.  Body water is lost during exercise. It must be replaced. This will help to avoid loss of body fluids (dehydration) or heat stroke. Be sure to talk to your caregiver before starting an exercise program to make sure it is safe for you. Remember, any activity is better than none.  Document Released: 09/17/2003 Document Revised: 09/19/2011 Document Reviewed: 01/01/2009 [facility] Patient Information 2013 [place], [place].

## 2012-09-20 NOTE — Telephone Encounter (Signed)
Labs ordered.

## 2012-09-23 NOTE — Progress Notes (Signed)
PaPhysical Exam  Constitutional: He is oriented to person, place, and time and well-developed, well-nourished, and in no distress. No distress.  HENT:  Head: Normocephalic and atraumatic.  Eyes: Conjunctivae are normal.  Neck: Neck supple. No thyromegaly present.  Cardiovascular: Normal rate, regular rhythm and normal heart sounds.   No murmur heard. Pulmonary/Chest: Effort normal and breath sounds normal. No respiratory distress.  Abdominal: He exhibits no distension and no mass. There is no tenderness.  Musculoskeletal: He exhibits no edema.  Neurological: He is alert and oriented to person, place, and time.  Skin: Skin is warm.  Psychiatric: Memory, affect and judgment normal.      AIMEE TIMMONS [id number] 1951-06-25 09/23/2012      Progress Note-Follow Up  Subjective  Chief Complaint  Chief Complaint  Patient presents with  . Follow-up    6 month    HPI  Patient is a 62 year old Caucasian male who is in today for followup he said very stressful last 2 months. His wife 4 months ago had an incarcerated umbilical hernia and while working on this surgically did discover to endometrial cancer. She is presently under: Hysterectomy and has had chemotherapy. He notes during all distress HEENT more carbohydrates and usually enhancement there has migraines and gout as well as increase in his blood sugars. He has since cut back on his arms and his numbers are improving. His scalp is also improved. He does use Advil and gout flares with relatively good results. It occurs in his great toes. He said some recent trouble with hip and shoulder pain as well as some TMJ. No chest pain or palpitations. No recent illness fevers, GI or GU concerns noted.  Past Medical History  Diagnosis Date  . OSA (obstructive sleep apnea)   . Hypertension   . Migraine headache     remote history  . Diabetes mellitus type II 1/09  . Gout 09/18/2012    Left big toe    Past Surgical History  Procedure  Laterality Date  . Tonsillectomy  1959  . Hernia repair  1998    umbilical hernia repair  . Knee surgery      left knee surgery  . Vasectomy      Family History  Problem Relation Age of Onset  . Breast cancer Mother   . Hypertension Mother   . Hyperlipidemia Father   . Colon cancer Neg Hx   . Stomach cancer Neg Hx     History   Social History  . Marital Status: Married    Spouse Name: N/A    Number of Children: N/A  . Years of Education: N/A   Occupational History  . Not on file.   Social History Main Topics  . Smoking status: Never Smoker   . Smokeless tobacco: Never Used  . Alcohol Use: No  . Drug Use: No  . Sexually Active: Not on file   Other Topics Concern  . Not on file   Social History Narrative   Occupation:  Energy manager   Married  -  1 daughter (age 57)    Never Smoked   Alcohol use-no     Drug use-no           Current Outpatient Prescriptions on File Prior to Visit  Medication Sig Dispense Refill  . amLODipine (NORVASC) 10 MG tablet Take 1 tablet (10 mg total) by mouth daily.  90 tablet  1  . Calcium-Magnesium-Zinc 1000-400-15 MG TABS Take by mouth daily.        Marland Kitchen  Chelated Potassium 99 MG TABS Take 99 mg by mouth 2 (two) times daily.       . Cinnamon 500 MG capsule Take 500 mg by mouth 2 (two) times daily. +50 mg Chromium in each supplement.      . Coenzyme Q10 (CO Q-10) 100 MG CAPS Take 100 mg by mouth daily.        . Cyanocobalamin (VITAMIN B 12) 250 MCG LOZG Take 250 mcg by mouth 2 (two) times a week.        . Glucosamine-Chondroit-Vit C-Mn (GLUCOSAMINE CHONDR 1500 COMPLX) CAPS Take by mouth daily. 1500 mg Glucosamine + 1200 mg Chondroitin QD.      Marland Kitchen glucose blood (ONE TOUCH TEST STRIPS) test strip Use as instructed to check blood sugar up to three times per day       . lisinopril (PRINIVIL,ZESTRIL) 40 MG tablet Take 1 tablet (40 mg total) by mouth daily.  90 tablet  0  . Omega-3 Fatty Acids (FISH OIL PO) Take 1,500 mg by mouth daily.         . ONE TOUCH LANCETS MISC Use as instructed to check blood sugar for up to three times per day       . simvastatin (ZOCOR) 10 MG tablet Take 1 tablet (10 mg total) by mouth at bedtime.  90 tablet  1  . vitamin C (ASCORBIC ACID) 500 MG tablet Take 500 mg by mouth 2 (two) times daily.         No current facility-administered medications on file prior to visit.    No Known Allergies  Review of Systems  Review of Systems  Constitutional: Positive for malaise/fatigue. Negative for fever.  HENT: Positive for neck pain. Negative for congestion.   Eyes: Negative for discharge.  Respiratory: Negative for shortness of breath.   Cardiovascular: Negative for chest pain, palpitations and leg swelling.  Gastrointestinal: Negative for nausea, abdominal pain and diarrhea.  Genitourinary: Negative for dysuria.  Musculoskeletal: Positive for joint pain. Negative for falls.  Skin: Negative for rash.  Neurological: Positive for headaches. Negative for loss of consciousness.  Endo/Heme/Allergies: Negative for polydipsia.  Psychiatric/Behavioral: Negative for depression and suicidal ideas. The patient is not nervous/anxious and does not have insomnia.     Objective  BP 152/78  Pulse 77  Temp(Src) 97.6 F (36.4 C) (Oral)  Ht 5\' 11"  (1.803 m)  Wt 216 lb 0.6 oz (97.995 kg)  BMI 30.14 kg/m2  SpO2 97%  Physical Exam  See above Lab Results  Component Value Date   TSH 1.63 09/12/2012   Lab Results  Component Value Date   WBC 7.7 09/12/2012   HGB 14.5 09/12/2012   HCT 41.7 09/12/2012   MCV 87.2 09/12/2012   PLT 252.0 09/12/2012   Lab Results  Component Value Date   CREATININE 1.1 09/12/2012   BUN 22 09/12/2012   NA 139 09/12/2012   K 3.7 09/12/2012   CL 105 09/12/2012   CO2 26 09/12/2012   Lab Results  Component Value Date   ALT 25 09/12/2012   AST 20 09/12/2012   ALKPHOS 51 09/12/2012   BILITOT 0.9 09/12/2012   Lab Results  Component Value Date   CHOL 106 09/12/2012   Lab Results  Component Value Date    HDL 26.20* 09/12/2012   Lab Results  Component Value Date   LDLCALC 62 09/12/2012   Lab Results  Component Value Date   TRIG 87.0 09/12/2012   Lab Results  Component Value Date  CHOLHDL 4 09/12/2012     Assessment & Plan  Gout Increase hydration and NSAIDs as needed. Advil 400mg  po bid helps   DIABETES MELLITUS, TYPE II, BORDERLINE hgba1c 5.9 has cut back on his carbs and restarted on Metformin temporarily. And now improving  HYPERLIPIDEMIA Mild depression in HDL cholesterol encouraged increaed exercise and consider MegaRed caps daily  SLEEP APNEA, OBSTRUCTIVE Uses machine regularly.   HYPERTENSION Continue current meds, encouraged DASH diet, increase exercise     Objective  BP 152/78  Pulse 77  Temp(Src) 97.6 F (36.4 C) (Oral)  Ht 5\' 11"  (1.803 m)  Wt 216 lb 0.6 oz (97.995 kg)  BMI 30.14 kg/m2  SpO2 97%  Physical Exam    Lab Results  Component Value Date   TSH 1.63 09/12/2012   Lab Results  Component Value Date   WBC 7.7 09/12/2012   HGB 14.5 09/12/2012   HCT 41.7 09/12/2012   MCV 87.2 09/12/2012   PLT 252.0 09/12/2012   Lab Results  Component Value Date   CREATININE 1.1 09/12/2012   BUN 22 09/12/2012   NA 139 09/12/2012   K 3.7 09/12/2012   CL 105 09/12/2012   CO2 26 09/12/2012   Lab Results  Component Value Date   ALT 25 09/12/2012   AST 20 09/12/2012   ALKPHOS 51 09/12/2012   BILITOT 0.9 09/12/2012   Lab Results  Component Value Date   CHOL 106 09/12/2012   Lab Results  Component Value Date   HDL 26.20* 09/12/2012   Lab Results  Component Value Date   LDLCALC 62 09/12/2012   Lab Results  Component Value Date   TRIG 87.0 09/12/2012   Lab Results  Component Value Date   CHOLHDL 4 09/12/2012     Assessment & Plan  Gout Increase hydration and NSAIDs as needed. Advil 400mg  po bid helps   DIABETES MELLITUS, TYPE II, BORDERLINE hgba1c 5.9 has cut back on his carbs and restarted on Metformin temporarily. And now improving  HYPERLIPIDEMIA Mild depression in  HDL cholesterol encouraged increaed exercise and consider MegaRed caps daily  SLEEP APNEA, OBSTRUCTIVE Uses machine regularly.   HYPERTENSION Continue current meds, encouraged DASH diet, increase exercise

## 2012-09-23 NOTE — Assessment & Plan Note (Signed)
Continue current meds, encouraged DASH diet, increase exercise

## 2012-10-29 ENCOUNTER — Telehealth: Payer: Self-pay | Admitting: Family Medicine

## 2012-10-29 MED ORDER — AMLODIPINE BESYLATE 10 MG PO TABS: 10.0000 mg | ORAL_TABLET | Freq: Every day | ORAL | Status: DC

## 2012-10-29 NOTE — Telephone Encounter (Signed)
norvasc 10 mg tab take 1 tablet by mouth daily qty 90

## 2012-11-21 ENCOUNTER — Other Ambulatory Visit: Payer: Self-pay | Admitting: Family Medicine

## 2013-01-17 ENCOUNTER — Other Ambulatory Visit: Payer: Self-pay

## 2013-01-23 ENCOUNTER — Other Ambulatory Visit: Payer: Self-pay | Admitting: Internal Medicine

## 2013-01-23 ENCOUNTER — Other Ambulatory Visit: Payer: Self-pay | Admitting: *Deleted

## 2013-01-23 DIAGNOSIS — E785 Hyperlipidemia, unspecified: Secondary | ICD-10-CM

## 2013-01-23 MED ORDER — SIMVASTATIN 10 MG PO TABS: 10.0000 mg | ORAL_TABLET | Freq: Every day | ORAL | Status: DC

## 2013-01-23 NOTE — Telephone Encounter (Signed)
Refill for Zocor sent to [facility] on [address]

## 2013-02-21 ENCOUNTER — Other Ambulatory Visit: Payer: Self-pay | Admitting: *Deleted

## 2013-02-21 MED ORDER — LISINOPRIL 40 MG PO TABS: ORAL_TABLET | ORAL | Status: DC

## 2013-02-21 NOTE — Telephone Encounter (Signed)
Rx request to pharmacy/SLS  

## 2013-03-15 ENCOUNTER — Telehealth: Payer: Self-pay | Admitting: Family Medicine

## 2013-03-15 ENCOUNTER — Other Ambulatory Visit (INDEPENDENT_AMBULATORY_CARE_PROVIDER_SITE_OTHER): Payer: Self-pay

## 2013-03-15 DIAGNOSIS — R7309 Other abnormal glucose: Secondary | ICD-10-CM

## 2013-03-15 DIAGNOSIS — I10 Essential (primary) hypertension: Secondary | ICD-10-CM

## 2013-03-15 DIAGNOSIS — E785 Hyperlipidemia, unspecified: Secondary | ICD-10-CM

## 2013-03-15 LAB — HEPATIC FUNCTION PANEL
ALT: 31 U/L (ref 0–53)
Albumin: 4.1 g/dL (ref 3.5–5.2)
Total Bilirubin: 1 mg/dL (ref 0.3–1.2)

## 2013-03-15 LAB — TSH: TSH: 1.56 u[IU]/mL (ref 0.35–5.50)

## 2013-03-15 LAB — LIPID PANEL
Cholesterol: 106 mg/dL (ref 0–200)
HDL: 27.1 mg/dL — ABNORMAL LOW (ref >39.00)
VLDL: 17.6 mg/dL (ref 0.0–40.0)

## 2013-03-15 LAB — RENAL FUNCTION PANEL
Albumin: 4.1 g/dL (ref 3.5–5.2)
Calcium: 9 mg/dL (ref 8.4–10.5)
Creatinine, Ser: 1.1 mg/dL (ref 0.4–1.5)
Glucose, Bld: 186 mg/dL — ABNORMAL HIGH (ref 70–99)

## 2013-03-15 LAB — CBC
HCT: 44.8 % (ref 39.0–52.0)
Hemoglobin: 15.6 g/dL (ref 13.0–17.0)
MCV: 88.2 fl (ref 78.0–100.0)
RBC: 5.07 Mil/uL (ref 4.22–5.81)

## 2013-03-15 NOTE — Telephone Encounter (Signed)
[facility] called requesting that we put in future orders for patient. Patient is there now. Thanks! [phone]

## 2013-03-16 LAB — MICROALBUMIN, URINE: Microalb, Ur: 1.66 mg/dL (ref 0.00–1.89)

## 2013-03-21 ENCOUNTER — Telehealth: Payer: Self-pay | Admitting: Family Medicine

## 2013-03-21 ENCOUNTER — Ambulatory Visit (INDEPENDENT_AMBULATORY_CARE_PROVIDER_SITE_OTHER): Payer: Self-pay | Admitting: Family Medicine

## 2013-03-21 ENCOUNTER — Encounter: Payer: Self-pay | Admitting: Family Medicine

## 2013-03-21 VITALS — BP 124/70 | HR 70 | Temp 97.7°F | Ht 71.0 in | Wt 217.0 lb

## 2013-03-21 DIAGNOSIS — E785 Hyperlipidemia, unspecified: Secondary | ICD-10-CM

## 2013-03-21 DIAGNOSIS — Z0000 Encounter for general adult medical examination without abnormal findings: Secondary | ICD-10-CM

## 2013-03-21 DIAGNOSIS — R7309 Other abnormal glucose: Secondary | ICD-10-CM

## 2013-03-21 DIAGNOSIS — I10 Essential (primary) hypertension: Secondary | ICD-10-CM

## 2013-03-21 DIAGNOSIS — M109 Gout, unspecified: Secondary | ICD-10-CM

## 2013-03-21 DIAGNOSIS — G4733 Obstructive sleep apnea (adult) (pediatric): Secondary | ICD-10-CM

## 2013-03-21 DIAGNOSIS — E119 Type 2 diabetes mellitus without complications: Secondary | ICD-10-CM

## 2013-03-21 MED ORDER — METFORMIN HCL 500 MG PO TABS: 500.0000 mg | ORAL_TABLET | Freq: Every day | ORAL | Status: DC

## 2013-03-21 NOTE — Patient Instructions (Addendum)
Consider MegaRed krill oil caps daily  Consider a probiotic such as Digestive Advantage by [employer]  Basic Carbohydrate Counting Basic carbohydrate counting is a way to plan meals. It is done by counting the amount of carbohydrate in foods. Foods that have carbohydrates are starches (grains, beans, starchy vegetables) and sweets. Eating carbohydrates increases blood glucose (sugar) levels. People with diabetes use carbohydrate counting to help keep their blood glucose at a normal level.  COUNTING CARBOHYDRATES IN FOODS The first step in counting carbohydrates is to learn how many carbohydrate servings you should have in every meal. A dietitian can plan this for you. After learning the amount of carbohydrates to include in your meal plan, you can start to choose the carbohydrate-containing foods you want to eat.  There are 2 ways to identify the amount of carbohydrates in the foods you eat.  Read the Nutrition Facts panel on food labels. You need 2 pieces of information from the Nutrition Facts panel to count carbohydrates this way:  Serving size.  Total carbohydrate (in grams). Decide how many servings you will be eating. If it is 1 serving, you will be eating the amount of carbohydrate listed on the panel. If you will be eating 2 servings, you will be eating double the amount of carbohydrate listed on the panel.   Learn serving sizes. A serving size of most carbohydrate-containing foods is about 15 grams (g). Listed below are single serving sizes of common carbohydrate-containing foods:  1 slice bread.   cup unsweetened, dry cereal.   cup hot cereal.   cup rice.   cup mashed potatoes.   cup pasta.  1 cup fresh fruit.   cup canned fruit.  1 cup milk (whole, 2%, or skim).   cup starchy vegetables (peas, corn, or potatoes). Counting carbohydrates this way is similar to looking on the Nutrition Facts panel. Decide how many servings you will eat first. Multiply the number of  servings you eat by 15 g. For example, if you have 2 cups of strawberries, you had 2 servings. That means you had 30 g of carbohydrate (2 servings x 15 g = 30 g). CALCULATING CARBOHYDRATES IN A MEAL Sample dinner  3 oz chicken breast.   cup brown rice.   cup corn.  1 cup fat-free milk.  1 cup strawberries with sugar-free whipped topping. Carbohydrate calculation First, identify the foods that contain carbohydrate:  Rice.  Corn.  Milk.  Strawberries. Calculate the number of servings eaten:  2 servings rice.  1 serving corn.  1 serving milk.  1 serving strawberries. Multiply the number of servings by 15 g:  2 servings rice x 15 g = 30 g.  1 serving corn x 15 g = 15 g.  1 serving milk x 15 g = 15 g.  1 serving strawberries x 15 g = 15 g. Add the amounts to find the total carbohydrates eaten: 30 g + 15 g + 15 g + 15 g = 75 g carbohydrate eaten at dinner. Document Released: 06/27/2005 Document Revised: 09/19/2011 Document Reviewed: 05/13/2011 [facility] Patient Information 2014 [place], [place].

## 2013-03-21 NOTE — Assessment & Plan Note (Signed)
Well controlled, no changes 

## 2013-03-21 NOTE — Telephone Encounter (Signed)
Check out comments: Labs prior 3 mnths, lipid, renal, hepatic, hgba1c, cbc, tsh, uric acid, psa   Patient has appointment in December and will be going to [facility] lab

## 2013-03-21 NOTE — Assessment & Plan Note (Signed)
Restart Metformin 500 mg po daily, avoid simple carbs.

## 2013-03-21 NOTE — Progress Notes (Signed)
Patient ID: Evan Best, male   DOB: 1951/07/10, 62 y.o.   MRN: [id number] Evan Best [id number] 1950-09-26 03/21/2013      Progress Note-Follow Up  Subjective  Chief Complaint  Chief Complaint  Patient presents with  . Follow-up    6 month    HPI  Patient is a 62 year old male who is in today for followup he notes his blood sugars have been trending up. Recent fingerstick glucose was 250. Has been struggling with some polyuria and polydipsia as well as fatigue. Has not been exercising or eating well. No chest pain, palpitations, shortness of breath, GI or GU complaints noted at this time. Taking medications as prescribed.  Past Medical History  Diagnosis Date  . OSA (obstructive sleep apnea)   . Hypertension   . Migraine headache     remote history  . Diabetes mellitus type II 1/09  . Gout 09/18/2012    Left big toe    Past Surgical History  Procedure Laterality Date  . Tonsillectomy  1959  . Hernia repair  1998    umbilical hernia repair  . Knee surgery      left knee surgery  . Vasectomy      Family History  Problem Relation Age of Onset  . Breast cancer Mother   . Hypertension Mother   . Hyperlipidemia Father   . Colon cancer Neg Hx   . Stomach cancer Neg Hx     History   Social History  . Marital Status: Married    Spouse Name: N/A    Number of Children: N/A  . Years of Education: N/A   Occupational History  . Not on file.   Social History Main Topics  . Smoking status: Never Smoker   . Smokeless tobacco: Never Used  . Alcohol Use: No  . Drug Use: No  . Sexual Activity: Not on file   Other Topics Concern  . Not on file   Social History Narrative   Occupation:  Energy manager   Married  -  1 daughter (age 18)    Never Smoked   Alcohol use-no     Drug use-no           Current Outpatient Prescriptions on File Prior to Visit  Medication Sig Dispense Refill  . amLODipine (NORVASC) 10 MG tablet Take 1 tablet (10 mg total) by  mouth daily.  90 tablet  1  . Calcium-Magnesium-Zinc 1000-400-15 MG TABS Take by mouth daily.        . Chelated Potassium 99 MG TABS Take 99 mg by mouth 2 (two) times daily.       . Cinnamon 500 MG capsule Take 500 mg by mouth 2 (two) times daily. +50 mg Chromium in each supplement.      . Coenzyme Q10 (CO Q-10) 100 MG CAPS Take 100 mg by mouth daily.        . Cyanocobalamin (VITAMIN B 12) 250 MCG LOZG Take 250 mcg by mouth 2 (two) times a week.        . Glucosamine-Chondroit-Vit C-Mn (GLUCOSAMINE CHONDR 1500 COMPLX) CAPS Take by mouth daily. 1500 mg Glucosamine + 1200 mg Chondroitin QD.      Marland Kitchen glucose blood (ONE TOUCH TEST STRIPS) test strip Use as instructed to check blood sugar up to three times per day       . lisinopril (PRINIVIL,ZESTRIL) 40 MG tablet TAKE ONE TABLET BY MOUTH ONCE DAILY  90 tablet  0  . Omega-3 Fatty  Acids (FISH OIL PO) Take 1,500 mg by mouth daily.        . ONE TOUCH LANCETS MISC Use as instructed to check blood sugar for up to three times per day       . simvastatin (ZOCOR) 10 MG tablet Take 1 tablet (10 mg total) by mouth at bedtime.  90 tablet  3  . vitamin C (ASCORBIC ACID) 500 MG tablet Take 500 mg by mouth 2 (two) times daily.         No current facility-administered medications on file prior to visit.    No Known Allergies  Review of Systems  Review of Systems  Constitutional: Negative for fever and malaise/fatigue.  HENT: Negative for congestion.   Eyes: Negative for discharge.  Respiratory: Negative for shortness of breath.   Cardiovascular: Negative for chest pain, palpitations and leg swelling.  Gastrointestinal: Negative for nausea, abdominal pain and diarrhea.  Genitourinary: Negative for dysuria.  Musculoskeletal: Negative for falls.  Skin: Negative for rash.  Neurological: Negative for loss of consciousness and headaches.  Endo/Heme/Allergies: Negative for polydipsia.  Psychiatric/Behavioral: Negative for depression and suicidal ideas. The  patient is not nervous/anxious and does not have insomnia.     Objective  BP 124/70  Pulse 70  Temp(Src) 97.7 F (36.5 C) (Oral)  Ht 5\' 11"  (1.803 m)  Wt 217 lb 0.6 oz (98.449 kg)  BMI 30.28 kg/m2  SpO2 96%  Physical Exam  Physical Exam  Constitutional: He is oriented to person, place, and time and well-developed, well-nourished, and in no distress. No distress.  HENT:  Head: Normocephalic and atraumatic.  Eyes: Conjunctivae are normal.  Neck: Neck supple. No thyromegaly present.  Cardiovascular: Normal rate, regular rhythm and normal heart sounds.   No murmur heard. Pulmonary/Chest: Effort normal and breath sounds normal. No respiratory distress.  Abdominal: He exhibits no distension and no mass. There is no tenderness.  Musculoskeletal: He exhibits no edema.  Neurological: He is alert and oriented to person, place, and time.  Skin: Skin is warm.  Psychiatric: Memory, affect and judgment normal.    Lab Results  Component Value Date   TSH 1.56 03/15/2013   Lab Results  Component Value Date   WBC 7.9 03/15/2013   HGB 15.6 03/15/2013   HCT 44.8 03/15/2013   MCV 88.2 03/15/2013   PLT 236.0 03/15/2013   Lab Results  Component Value Date   CREATININE 1.1 03/15/2013   BUN 17 03/15/2013   NA 137 03/15/2013   K 3.6 03/15/2013   CL 102 03/15/2013   CO2 30 03/15/2013   Lab Results  Component Value Date   ALT 31 03/15/2013   AST 20 03/15/2013   ALKPHOS 50 03/15/2013   BILITOT 1.0 03/15/2013   Lab Results  Component Value Date   CHOL 106 03/15/2013   Lab Results  Component Value Date   HDL 27.10* 03/15/2013   Lab Results  Component Value Date   LDLCALC 61 03/15/2013   Lab Results  Component Value Date   TRIG 88.0 03/15/2013   Lab Results  Component Value Date   CHOLHDL 4 03/15/2013     Assessment & Plan  HYPERTENSION Well controlled, no changes  DIABETES MELLITUS, TYPE II, BORDERLINE Restart Metformin 500 mg po daily, avoid simple carbs.   HYPERLIPIDEMIA Avoid trans fats,  increase exercise and start a krill oil cap. Simvastatin 10 mg qhs.  SLEEP APNEA, OBSTRUCTIVE Using CPAP regularly

## 2013-03-24 NOTE — Assessment & Plan Note (Signed)
Avoid trans fats, increase exercise and start a krill oil cap. Simvastatin 10 mg qhs.

## 2013-03-24 NOTE — Assessment & Plan Note (Signed)
Using CPAP regularly

## 2013-04-24 ENCOUNTER — Other Ambulatory Visit: Payer: Self-pay | Admitting: Family Medicine

## 2013-04-24 NOTE — Telephone Encounter (Signed)
Rx request to pharmacy/SLS  

## 2013-05-17 ENCOUNTER — Encounter: Payer: Self-pay | Admitting: Internal Medicine

## 2013-05-17 ENCOUNTER — Ambulatory Visit (INDEPENDENT_AMBULATORY_CARE_PROVIDER_SITE_OTHER): Payer: Self-pay | Admitting: Internal Medicine

## 2013-05-17 VITALS — BP 122/64 | HR 74 | Ht 71.0 in | Wt 216.0 lb

## 2013-05-17 DIAGNOSIS — G4733 Obstructive sleep apnea (adult) (pediatric): Secondary | ICD-10-CM

## 2013-05-17 DIAGNOSIS — R05 Cough: Secondary | ICD-10-CM

## 2013-05-17 DIAGNOSIS — J310 Chronic rhinitis: Secondary | ICD-10-CM

## 2013-05-17 MED ORDER — AZELASTINE-FLUTICASONE 137-50 MCG/ACT NA SUSP: 1.0000 | Freq: Once | NASAL | Status: DC

## 2013-05-17 NOTE — Progress Notes (Signed)
05/13/11- 62 year old male never smoker followed for obstructive sleep apnea, allergic rhinitis, complicated by HBP, DM LOV- 05/14/2010 He has been compliant with CPAP but feels it is not working quite as well. He is waking up more often over the last month or 2 and feels a little tired  during the day. He is not snoring through the current pressure of 7. Aware of some cough and nasal congestion with increased indigestion. His weight has gone up about 6 pounds..  05/11/12- 62 year old male never smoker followed for obstructive sleep apnea, allergic rhinitis, complicated by HBP, DM Wears CPAP 8/ Advanced every night for about 6 hours and pressure doing well. He has no insurance and finances are a problem. He thinks he is doing well and currently denies any problem with rhinitis. Has taken occasional Sudafed if needed  05/17/13- 62 year old male never smoker followed for obstructive sleep apnea, allergic rhinitis, complicated by HBP, DM FOLLOWS FOR: wears CPAP 8/ Advanced every night for about 6 hours; pressure works well for patient; DME is [facility]. He is scheduled to get flu shot this afternoon. Occasional stuffy nose managed with Sudafed. He did not like Afrin. If not treated it interferes significantly with CPAP. We discussed saline nasal sprays. He mentions occasional dry cough so we discussed his lisinopril. He is not concerned, so change may not be necessary.   ROS-see HPI Constitutional:   No-   weight loss, night sweats, fevers, chills, fatigue, lassitude. HEENT:   No-  headaches, difficulty swallowing, tooth/dental problems, sore throat,       No-  sneezing, itching, ear ache, +nasal congestion, post nasal drip,  CV:  No-   chest pain, orthopnea, PND, swelling in lower extremities, anasarca, dizziness, palpitations Resp: No-   shortness of breath with exertion or at rest.              No-   productive cough,  + non-productive cough,  No- coughing up of blood.              No-   change in color  of mucus.  No- wheezing.   Skin: No-   rash or lesions. GI:  No-   heartburn, indigestion, abdominal pain, nausea, vomiting,  GU: . MS:  No-   joint pain or swelling.   Neuro-     nothing unusual Psych:  No- change in mood or affect. No depression or anxiety.  No memory loss.  OBJ General- Alert, Oriented, Affect-appropriate, Distress- none acute Skin- rash-none, lesions- none, excoriation- none Lymphadenopathy- none Head- atraumatic            Eyes- Gross vision intact, PERRLA, conjunctivae clear secretions            Ears- Hearing, canals-normal            Nose- Clear, +narrower on right, +mucus bridging, polyps, erosion, perforation             Throat- Mallampati II , +mucosa red and cobblestoned , drainage- none, tonsils- atrophic Neck- flexible , trachea midline, no stridor , thyroid nl, carotid no bruit Chest - symmetrical excursion , unlabored           Heart/CV- RRR , no murmur , no gallop  , no rub, nl s1 s2                           - JVD- none , edema- none, stasis changes- none, varices- none  Lung- clear to P&A, wheeze- none, cough- none , dullness-none, rub- none           Chest wall-  Abd-  Br/ Gen/ Rectal- Not done, not indicated Extrem- cyanosis- none, clubbing, none, atrophy- none, strength- nl Neuro- grossly intact to observation

## 2013-05-17 NOTE — Patient Instructions (Signed)
We can continue CPAP 8/ Advanced  Sample Dymista nasal spray  1-2 puffs every night at bedtime  See if this is helpful  You and your doctor could consider trying an ARB for BP control, like Benicar 40, for a month, instead of lisinopril. See if your dry cough improves with Benicar.

## 2013-05-21 ENCOUNTER — Other Ambulatory Visit: Payer: Self-pay | Admitting: Family Medicine

## 2013-06-01 NOTE — Assessment & Plan Note (Addendum)
Snoring: If it interferes with his sleep and CPAP use he can use Afrin occasionally with cautions discussed. Try Dymista

## 2013-06-01 NOTE — Assessment & Plan Note (Signed)
Pressure and compliance for good

## 2013-06-01 NOTE — Assessment & Plan Note (Signed)
In an effort to settle whether this cough is related to his ACE inhibitor lisinopril, plan try samples Benicar 40 mg

## 2013-06-17 ENCOUNTER — Other Ambulatory Visit (INDEPENDENT_AMBULATORY_CARE_PROVIDER_SITE_OTHER): Payer: Self-pay

## 2013-06-17 DIAGNOSIS — Z0000 Encounter for general adult medical examination without abnormal findings: Secondary | ICD-10-CM

## 2013-06-17 DIAGNOSIS — M109 Gout, unspecified: Secondary | ICD-10-CM

## 2013-06-17 DIAGNOSIS — I10 Essential (primary) hypertension: Secondary | ICD-10-CM

## 2013-06-17 DIAGNOSIS — R7309 Other abnormal glucose: Secondary | ICD-10-CM

## 2013-06-17 DIAGNOSIS — E785 Hyperlipidemia, unspecified: Secondary | ICD-10-CM

## 2013-06-17 LAB — LIPID PANEL
Cholesterol: 115 mg/dL (ref 0–200)
HDL: 28.9 mg/dL — ABNORMAL LOW (ref >39.00)
LDL Cholesterol: 67 mg/dL (ref 0–99)
Triglycerides: 96 mg/dL (ref 0.0–149.0)

## 2013-06-17 LAB — CBC
MCHC: 34.5 g/dL (ref 30.0–36.0)
MCV: 90.8 fl (ref 78.0–100.0)

## 2013-06-17 LAB — RENAL FUNCTION PANEL
Albumin: 4.3 g/dL (ref 3.5–5.2)
BUN: 19 mg/dL (ref 6–23)
Chloride: 104 mEq/L (ref 96–112)
Phosphorus: 3.7 mg/dL (ref 2.3–4.6)

## 2013-06-17 LAB — TSH: TSH: 2.4 u[IU]/mL (ref 0.35–5.50)

## 2013-06-17 LAB — HEPATIC FUNCTION PANEL
AST: 21 U/L (ref 0–37)
Albumin: 4.3 g/dL (ref 3.5–5.2)
Alkaline Phosphatase: 42 U/L (ref 39–117)
Total Protein: 7 g/dL (ref 6.0–8.3)

## 2013-06-17 LAB — PSA: PSA: 0.53 ng/mL (ref 0.10–4.00)

## 2013-06-17 LAB — URIC ACID: Uric Acid, Serum: 7 mg/dL (ref 4.0–7.8)

## 2013-06-20 ENCOUNTER — Ambulatory Visit: Payer: Self-pay | Admitting: Family Medicine

## 2013-06-24 ENCOUNTER — Encounter: Payer: Self-pay | Admitting: Family Medicine

## 2013-06-24 ENCOUNTER — Ambulatory Visit (INDEPENDENT_AMBULATORY_CARE_PROVIDER_SITE_OTHER): Payer: Self-pay | Admitting: Family Medicine

## 2013-06-24 ENCOUNTER — Telehealth: Payer: Self-pay | Admitting: Family Medicine

## 2013-06-24 VITALS — BP 138/80 | HR 67 | Temp 97.6°F | Ht 71.0 in | Wt 212.0 lb

## 2013-06-24 DIAGNOSIS — J209 Acute bronchitis, unspecified: Secondary | ICD-10-CM

## 2013-06-24 DIAGNOSIS — G4733 Obstructive sleep apnea (adult) (pediatric): Secondary | ICD-10-CM

## 2013-06-24 DIAGNOSIS — T7840XD Allergy, unspecified, subsequent encounter: Secondary | ICD-10-CM

## 2013-06-24 DIAGNOSIS — J310 Chronic rhinitis: Secondary | ICD-10-CM

## 2013-06-24 DIAGNOSIS — I10 Essential (primary) hypertension: Secondary | ICD-10-CM

## 2013-06-24 DIAGNOSIS — M109 Gout, unspecified: Secondary | ICD-10-CM

## 2013-06-24 DIAGNOSIS — E785 Hyperlipidemia, unspecified: Secondary | ICD-10-CM

## 2013-06-24 DIAGNOSIS — Z5189 Encounter for other specified aftercare: Secondary | ICD-10-CM

## 2013-06-24 DIAGNOSIS — R7309 Other abnormal glucose: Secondary | ICD-10-CM

## 2013-06-24 MED ORDER — FLUTICASONE PROPIONATE 50 MCG/ACT NA SUSP: 2.0000 | Freq: Every day | NASAL | Status: DC

## 2013-06-24 MED ORDER — HYDROCODONE-HOMATROPINE 5-1.5 MG/5ML PO SYRP: 5.0000 mL | ORAL_SOLUTION | Freq: Three times a day (TID) | ORAL | Status: DC | PRN

## 2013-06-24 MED ORDER — CEFDINIR 300 MG PO CAPS: 300.0000 mg | ORAL_CAPSULE | Freq: Two times a day (BID) | ORAL | Status: AC

## 2013-06-24 NOTE — Patient Instructions (Addendum)
Mucinex 600 mg po twice a day x 10 days Zinc daily such as Coldeeze, Xicam or Calcium/Magnesium/Zinc tab daily Sudafed/pseudoephedrine/D and Dextromethorphan/DM avoid these  Bronchitis Bronchitis is the body's way of reacting to injury and/or infection (inflammation) of the bronchi. Bronchi are the air tubes that extend from the windpipe into the lungs. If the inflammation becomes severe, it may cause shortness of breath. CAUSES  Inflammation may be caused by:  A virus.  Germs (bacteria).  Dust.  Allergens.  Pollutants and many other irritants. The cells lining the bronchial tree are covered with tiny hairs (cilia). These constantly beat upward, away from the lungs, toward the mouth. This keeps the lungs free of pollutants. When these cells become too irritated and are unable to do their job, mucus begins to develop. This causes the characteristic cough of bronchitis. The cough clears the lungs when the cilia are unable to do their job. Without either of these protective mechanisms, the mucus would settle in the lungs. Then you would develop pneumonia. Smoking is a common cause of bronchitis and can contribute to pneumonia. Stopping this habit is the single most important thing you can do to help yourself. TREATMENT   Your caregiver may prescribe an antibiotic if the cough is caused by bacteria. Also, medicines that open up your airways make it easier to breathe. Your caregiver may also recommend or prescribe an expectorant. It will loosen the mucus to be coughed up. Only take over-the-counter or prescription medicines for pain, discomfort, or fever as directed by your caregiver.  Removing whatever causes the problem (smoking, for example) is critical to preventing the problem from getting worse.  Cough suppressants may be prescribed for relief of cough symptoms.  Inhaled medicines may be prescribed to help with symptoms now and to help prevent problems from returning.  For those with  recurrent (chronic) bronchitis, there may be a need for steroid medicines. SEEK IMMEDIATE MEDICAL CARE IF:   During treatment, you develop more pus-like mucus (purulent sputum).  You have a fever.  You become progressively more ill.  You have increased difficulty breathing, wheezing, or shortness of breath. It is necessary to seek immediate medical care if you are elderly or sick from any other disease. MAKE SURE YOU:   Understand these instructions.  Will watch your condition.  Will get help right away if you are not doing well or get worse. Document Released: 06/27/2005 Document Revised: 02/27/2013 Document Reviewed: 02/19/2013 [facility] Patient Information 2014 [place], [place].

## 2013-06-24 NOTE — Progress Notes (Signed)
Pre visit review using our clinic review tool, if applicable. No additional management support is needed unless otherwise documented below in the visit note. 

## 2013-06-24 NOTE — Assessment & Plan Note (Signed)
Compliant with CPAP when not congested

## 2013-06-24 NOTE — Telephone Encounter (Signed)
Labs prior to visit march 2015. Lipid, renal, cbc, tsh hepatic, hgba1c

## 2013-06-24 NOTE — Progress Notes (Signed)
Patient ID: Evan Best, male   DOB: January 30, 1951, 62 y.o.   MRN: [id number] Evan Best [id number] 1951-05-08 06/24/2013      Progress Note-Follow Up  Subjective  Chief Complaint  Chief Complaint  Patient presents with  . Follow-up    3 month    HPI  Patient is a 62 year old male who is in today for followup. He's been struggling with respiratory symptoms and numerous weeks now. He describes upper respiratory symptoms several weeks ago symptoms improved but have not recurred. He is dealing with head and chest congestion. Malaise and myalgias. He has postnasal drip and cough. He has been trying over-the-counter medications including intermittent antihistamines and nasal saline with only minimal results other than that she's felt well. No recent hospitalizations. He is under a great deal of stress with his father in hospice.  Past Medical History  Diagnosis Date  . OSA (obstructive sleep apnea)   . Hypertension   . Migraine headache     remote history  . Diabetes mellitus type II 1/09  . Gout 09/18/2012    Left big toe    Past Surgical History  Procedure Laterality Date  . Tonsillectomy  1959  . Hernia repair  1998    umbilical hernia repair  . Knee surgery      left knee surgery  . Vasectomy      Family History  Problem Relation Age of Onset  . Breast cancer Mother   . Hypertension Mother   . Hyperlipidemia Father   . Colon cancer Neg Hx   . Stomach cancer Neg Hx     History   Social History  . Marital Status: Married    Spouse Name: N/A    Number of Children: N/A  . Years of Education: N/A   Occupational History  . Not on file.   Social History Main Topics  . Smoking status: Never Smoker   . Smokeless tobacco: Never Used  . Alcohol Use: No  . Drug Use: No  . Sexual Activity: Not on file   Other Topics Concern  . Not on file   Social History Narrative   Occupation:  Energy manager   Married  -  1 daughter (age 20)    Never Smoked   Alcohol use-no     Drug use-no           Current Outpatient Prescriptions on File Prior to Visit  Medication Sig Dispense Refill  . amLODipine (NORVASC) 10 MG tablet TAKE ONE TABLET BY MOUTH ONCE DAILY  90 tablet  0  . Azelastine-Fluticasone (DYMISTA) 137-50 MCG/ACT SUSP Place 1 spray into the nose once.  1 Bottle  0  . Calcium-Magnesium-Zinc 1000-400-15 MG TABS Take by mouth daily.        . Chelated Potassium 99 MG TABS Take 99 mg by mouth 2 (two) times daily.       . Cinnamon 500 MG capsule Take 500 mg by mouth 2 (two) times daily. +50 mg Chromium in each supplement.      . Coenzyme Q10 (CO Q-10) 100 MG CAPS Take 100 mg by mouth daily.        . Cyanocobalamin (VITAMIN B 12) 250 MCG LOZG Take 250 mcg by mouth 2 (two) times a week.        . Glucosamine-Chondroit-Vit C-Mn (GLUCOSAMINE CHONDR 1500 COMPLX) CAPS Take by mouth daily. 1500 mg Glucosamine + 1200 mg Chondroitin QD.      Marland Kitchen glucose blood (ONE TOUCH TEST STRIPS)  test strip Use as instructed to check blood sugar up to three times per day       . lisinopril (PRINIVIL,ZESTRIL) 40 MG tablet TAKE ONE TABLET BY MOUTH ONCE DAILY  90 tablet  1  . metFORMIN (GLUCOPHAGE) 500 MG tablet Take 1 tablet (500 mg total) by mouth daily with breakfast.  30 tablet  5  . Omega-3 Fatty Acids (FISH OIL PO) Take 1,500 mg by mouth daily.        . ONE TOUCH LANCETS MISC Use as instructed to check blood sugar for up to three times per day       . simvastatin (ZOCOR) 10 MG tablet Take 1 tablet (10 mg total) by mouth at bedtime.  90 tablet  3  . vitamin C (ASCORBIC ACID) 500 MG tablet Take 500 mg by mouth 2 (two) times daily.         No current facility-administered medications on file prior to visit.    No Known Allergies  Review of Systems  Review of Systems  Constitutional: Negative for fever and malaise/fatigue.  HENT: Negative for congestion.   Eyes: Negative for discharge.  Respiratory: Negative for shortness of breath.   Cardiovascular: Negative  for chest pain, palpitations and leg swelling.  Gastrointestinal: Negative for nausea, abdominal pain and diarrhea.  Genitourinary: Negative for dysuria.  Musculoskeletal: Negative for falls.  Skin: Negative for rash.  Neurological: Negative for loss of consciousness and headaches.  Endo/Heme/Allergies: Negative for polydipsia.  Psychiatric/Behavioral: Negative for depression and suicidal ideas. The patient is not nervous/anxious and does not have insomnia.     Objective  BP 138/80  Pulse 67  Temp(Src) 97.6 F (36.4 C) (Oral)  Ht 5\' 11"  (1.803 m)  Wt 212 lb 0.6 oz (96.181 kg)  BMI 29.59 kg/m2  SpO2 98%  Physical Exam  Physical Exam  Constitutional: He is oriented to person, place, and time and well-developed, well-nourished, and in no distress. No distress.  HENT:  Head: Normocephalic and atraumatic.  Eyes: Conjunctivae are normal.  Neck: Neck supple. No thyromegaly present.  Cardiovascular: Normal rate, regular rhythm and normal heart sounds.   No murmur heard. Pulmonary/Chest: Effort normal and breath sounds normal. No respiratory distress.  Abdominal: He exhibits no distension and no mass. There is no tenderness.  Musculoskeletal: He exhibits no edema.  Neurological: He is alert and oriented to person, place, and time.  Skin: Skin is warm.  Psychiatric: Memory, affect and judgment normal.    Lab Results  Component Value Date   TSH 2.40 06/17/2013   Lab Results  Component Value Date   WBC 6.9 06/17/2013   HGB 14.8 06/17/2013   HCT 43.0 06/17/2013   MCV 90.8 06/17/2013   PLT 265.0 06/17/2013   Lab Results  Component Value Date   CREATININE 1.1 06/17/2013   BUN 19 06/17/2013   NA 139 06/17/2013   K 3.7 06/17/2013   CL 104 06/17/2013   CO2 30 06/17/2013   Lab Results  Component Value Date   ALT 27 06/17/2013   AST 21 06/17/2013   ALKPHOS 42 06/17/2013   BILITOT 0.9 06/17/2013   Lab Results  Component Value Date   CHOL 115 06/17/2013   Lab Results  Component Value  Date   HDL 28.90* 06/17/2013   Lab Results  Component Value Date   LDLCALC 67 06/17/2013   Lab Results  Component Value Date   TRIG 96.0 06/17/2013   Lab Results  Component Value Date   CHOLHDL 4  06/17/2013     Assessment & Plan  SLEEP APNEA, OBSTRUCTIVE Compliant with CPAP when not congested  HYPERTENSION Well controlled, no changes.   HYPERLIPIDEMIA Tolerating Zocor, no changes  DIABETES MELLITUS, TYPE II, BORDERLINE Improved hgba1c with Metfromin and dietary changes  Gout No recent flares  Acute bronchitis Started on Antibiotics, add probiotics, mucinex and increase hydration and rest  CHRONIC RHINITIS Daily flonase and Cetirizine

## 2013-06-30 DIAGNOSIS — J209 Acute bronchitis, unspecified: Secondary | ICD-10-CM | POA: Insufficient documentation

## 2013-06-30 NOTE — Assessment & Plan Note (Signed)
No recent flares 

## 2013-06-30 NOTE — Assessment & Plan Note (Signed)
Started on Antibiotics, add probiotics, mucinex and increase hydration and rest

## 2013-06-30 NOTE — Assessment & Plan Note (Signed)
Well controlled, no changes 

## 2013-06-30 NOTE — Assessment & Plan Note (Signed)
Tolerating Zocor, no changes

## 2013-06-30 NOTE — Assessment & Plan Note (Signed)
Improved hgba1c with Metfromin and dietary changes

## 2013-06-30 NOTE — Assessment & Plan Note (Signed)
Daily flonase and Cetirizine

## 2013-07-25 ENCOUNTER — Other Ambulatory Visit: Payer: Self-pay | Admitting: Family Medicine

## 2013-09-12 ENCOUNTER — Other Ambulatory Visit: Payer: Self-pay

## 2013-09-12 ENCOUNTER — Telehealth: Payer: Self-pay

## 2013-09-12 DIAGNOSIS — I10 Essential (primary) hypertension: Secondary | ICD-10-CM

## 2013-09-12 DIAGNOSIS — E785 Hyperlipidemia, unspecified: Secondary | ICD-10-CM

## 2013-09-12 DIAGNOSIS — R7309 Other abnormal glucose: Secondary | ICD-10-CM

## 2013-09-12 LAB — LIPID PANEL
CHOLESTEROL: 107 mg/dL (ref 0–200)
HDL: 28.6 mg/dL — ABNORMAL LOW (ref >39.00)
LDL CALC: 67 mg/dL (ref 0–99)
TRIGLYCERIDES: 56 mg/dL (ref 0.0–149.0)
Total CHOL/HDL Ratio: 4
VLDL: 11.2 mg/dL (ref 0.0–40.0)

## 2013-09-12 LAB — RENAL FUNCTION PANEL
Albumin: 3.9 g/dL (ref 3.5–5.2)
BUN: 18 mg/dL (ref 6–23)
CHLORIDE: 103 meq/L (ref 96–112)
CO2: 28 meq/L (ref 19–32)
Calcium: 8.7 mg/dL (ref 8.4–10.5)
Creatinine, Ser: 1.1 mg/dL (ref 0.4–1.5)
GFR: 70.66 mL/min (ref >60.00)
Glucose, Bld: 132 mg/dL — ABNORMAL HIGH (ref 70–99)
Phosphorus: 2.6 mg/dL (ref 2.3–4.6)
Potassium: 4.1 mEq/L (ref 3.5–5.1)
Sodium: 137 mEq/L (ref 135–145)

## 2013-09-12 LAB — CBC
HCT: 44.1 % (ref 39.0–52.0)
HEMOGLOBIN: 15.2 g/dL (ref 13.0–17.0)
MCHC: 34.6 g/dL (ref 30.0–36.0)
MCV: 88.4 fl (ref 78.0–100.0)
PLATELETS: 254 10*3/uL (ref 150.0–400.0)
RBC: 4.98 Mil/uL (ref 4.22–5.81)
RDW: 13.1 % (ref 11.5–14.6)
WBC: 6.9 10*3/uL (ref 4.5–10.5)

## 2013-09-12 LAB — HEPATIC FUNCTION PANEL
ALBUMIN: 3.9 g/dL (ref 3.5–5.2)
ALT: 24 U/L (ref 0–53)
AST: 24 U/L (ref 0–37)
Alkaline Phosphatase: 47 U/L (ref 39–117)
Bilirubin, Direct: 0.2 mg/dL (ref 0.0–0.3)
TOTAL PROTEIN: 7 g/dL (ref 6.0–8.3)
Total Bilirubin: 1.1 mg/dL (ref 0.3–1.2)

## 2013-09-12 LAB — HEMOGLOBIN A1C: HEMOGLOBIN A1C: 5.7 % (ref 4.6–6.5)

## 2013-09-12 LAB — TSH: TSH: 1.74 u[IU]/mL (ref 0.35–5.50)

## 2013-09-12 NOTE — Telephone Encounter (Signed)
Pts lab orders entered for [facility] lab

## 2013-09-19 ENCOUNTER — Ambulatory Visit (INDEPENDENT_AMBULATORY_CARE_PROVIDER_SITE_OTHER): Payer: Self-pay | Admitting: Family Medicine

## 2013-09-19 ENCOUNTER — Encounter: Payer: Self-pay | Admitting: Family Medicine

## 2013-09-19 VITALS — BP 128/82 | HR 69 | Temp 97.7°F | Ht 71.0 in | Wt 212.1 lb

## 2013-09-19 DIAGNOSIS — R7309 Other abnormal glucose: Secondary | ICD-10-CM

## 2013-09-19 DIAGNOSIS — T7840XA Allergy, unspecified, initial encounter: Secondary | ICD-10-CM

## 2013-09-19 DIAGNOSIS — E785 Hyperlipidemia, unspecified: Secondary | ICD-10-CM

## 2013-09-19 DIAGNOSIS — I10 Essential (primary) hypertension: Secondary | ICD-10-CM

## 2013-09-19 DIAGNOSIS — E119 Type 2 diabetes mellitus without complications: Secondary | ICD-10-CM

## 2013-09-19 MED ORDER — METFORMIN HCL 500 MG PO TABS: 500.0000 mg | ORAL_TABLET | Freq: Every day | ORAL | Status: DC

## 2013-09-19 NOTE — Progress Notes (Signed)
Pre visit review using our clinic review tool, if applicable. No additional management support is needed unless otherwise documented below in the visit note. 

## 2013-09-19 NOTE — Patient Instructions (Signed)
Labs at [facility] prior to next visit    Diabetes and Exercise Exercising regularly is important. It is not just about losing weight. It has many health benefits, such as:  Improving your overall fitness, flexibility, and endurance.  Increasing your bone density.  Helping with weight control.  Decreasing your body fat.  Increasing your muscle strength.  Reducing stress and tension.  Improving your overall health. People with diabetes who exercise gain additional benefits because exercise:  Reduces appetite.  Improves the body's use of blood sugar (glucose).  Helps lower or control blood glucose.  Decreases blood pressure.  Helps control blood lipids (such as cholesterol and triglycerides).  Improves the body's use of the hormone insulin by:  Increasing the body's insulin sensitivity.  Reducing the body's insulin needs.  Decreases the risk for heart disease because exercising:  Lowers cholesterol and triglycerides levels.  Increases the levels of good cholesterol (such as high-density lipoproteins [HDL]) in the body.  Lowers blood glucose levels. YOUR ACTIVITY PLAN  Choose an activity that you enjoy and set realistic goals. Your health care provider or diabetes educator can help you make an activity plan that works for you. You can break activities into 2 or 3 sessions throughout the day. Doing so is as good as one long session. Exercise ideas include:  Taking the dog for a walk.  Taking the stairs instead of the elevator.  Dancing to your favorite song.  Doing your favorite exercise with a friend. RECOMMENDATIONS FOR EXERCISING WITH TYPE 1 OR TYPE 2 DIABETES   Check your blood glucose before exercising. If blood glucose levels are greater than 240 mg/dL, check for urine ketones. Do not exercise if ketones are present.  Avoid injecting insulin into areas of the body that are going to be exercised. For example, avoid injecting insulin into:  The arms when playing  tennis.  The legs when jogging.  Keep a record of:  Food intake before and after you exercise.  Expected peak times of insulin action.  Blood glucose levels before and after you exercise.  The type and amount of exercise you have done.  Review your records with your health care provider. Your health care provider will help you to develop guidelines for adjusting food intake and insulin amounts before and after exercising.  If you take insulin or oral hypoglycemic agents, watch for signs and symptoms of hypoglycemia. They include:  Dizziness.  Shaking.  Sweating.  Chills.  Confusion.  Drink plenty of water while you exercise to prevent dehydration or heat stroke. Body water is lost during exercise and must be replaced.  Talk to your health care provider before starting an exercise program to make sure it is safe for you. Remember, almost any type of activity is better than none. Document Released: 09/17/2003 Document Revised: 02/27/2013 Document Reviewed: 12/04/2012 [facility] Patient Information 2014 [place].

## 2013-09-22 ENCOUNTER — Encounter: Payer: Self-pay | Admitting: Family Medicine

## 2013-09-22 DIAGNOSIS — T7840XA Allergy, unspecified, initial encounter: Secondary | ICD-10-CM

## 2013-09-22 HISTORY — DX: Allergy, unspecified, initial encounter: T78.40XA

## 2013-09-22 NOTE — Assessment & Plan Note (Signed)
hgba1c acceptable, minimize simple carbs. Increase exercise as tolerated. Continue current meds 

## 2013-09-22 NOTE — Assessment & Plan Note (Signed)
Well controlled, no changes to meds. Encouraged heart healthy diet such as the DASH diet and exercise as tolerated.  °

## 2013-09-22 NOTE — Assessment & Plan Note (Signed)
Tolerating statin, encouraged heart healthy diet, avoid trans fats, minimize simple carbs and saturated fats. Increase exercise as tolerated 

## 2013-09-22 NOTE — Assessment & Plan Note (Signed)
Flonase, nasal saline prn

## 2013-09-22 NOTE — Progress Notes (Signed)
Patient ID: Evan Best, male   DOB: Feb 24, 1951, 63 y.o.   MRN: [id number] Evan CunasRichard E Gutkowski [id number] Feb 24, 1951 09/22/2013      Progress Note-Follow Up  Subjective  Chief Complaint  Chief Complaint  Patient presents with  . Follow-up    4 month    HPI  Patient is a 63 year old male in today for routine medical care.  Has been struggling with increased stress due to his wife's diagnosis of breast cancer. He has also been struggling with increased nasal congestion. No f/c/HA. Denies CP/palp/SOB/HA/congestion/fevers/GI or GU c/o. Taking meds as prescribed  Past Medical History  Diagnosis Date  . OSA (obstructive sleep apnea)   . Hypertension   . Migraine headache     remote history  . Diabetes mellitus type II 1/09  . Gout 09/18/2012    Left big toe  . Allergic state 09/22/2013    Past Surgical History  Procedure Laterality Date  . Tonsillectomy  1959  . Hernia repair  1998    umbilical hernia repair  . Knee surgery      left knee surgery  . Vasectomy      Family History  Problem Relation Age of Onset  . Breast cancer Mother   . Hypertension Mother   . Hyperlipidemia Father   . Kidney disease Father   . Heart disease Father   . Hypertension Father   . Colon cancer Neg Hx   . Stomach cancer Neg Hx     History   Social History  . Marital Status: Married    Spouse Name: N/A    Number of Children: N/A  . Years of Education: N/A   Occupational History  . Not on file.   Social History Main Topics  . Smoking status: Never Smoker   . Smokeless tobacco: Never Used  . Alcohol Use: No  . Drug Use: No  . Sexual Activity: Not on file   Other Topics Concern  . Not on file   Social History Narrative   Occupation:  Energy managerCorporate Accountant   Married  -  1 daughter (age 62)    Never Smoked   Alcohol use-no     Drug use-no           Current Outpatient Prescriptions on File Prior to Visit  Medication Sig Dispense Refill  . amLODipine (NORVASC) 10 MG tablet  TAKE ONE TABLET BY MOUTH ONCE DAILY  90 tablet  0  . Calcium-Magnesium-Zinc 1000-400-15 MG TABS Take by mouth daily.        . Chelated Potassium 99 MG TABS Take 99 mg by mouth 2 (two) times daily.       . Cinnamon 500 MG capsule Take 500 mg by mouth 2 (two) times daily. +50 mg Chromium in each supplement.      . Coenzyme Q10 (CO Q-10) 100 MG CAPS Take 100 mg by mouth daily.        . Cyanocobalamin (VITAMIN B 12) 250 MCG LOZG Take 250 mcg by mouth 2 (two) times a week.        . fluticasone (FLONASE) 50 MCG/ACT nasal spray Place 2 sprays into both nostrils daily.  16 g  6  . Glucosamine-Chondroit-Vit C-Mn (GLUCOSAMINE CHONDR 1500 COMPLX) CAPS Take by mouth daily. 1500 mg Glucosamine + 1200 mg Chondroitin QD.      Marland Kitchen. glucose blood (ONE TOUCH TEST STRIPS) test strip Use as instructed to check blood sugar up to three times per day       .  lisinopril (PRINIVIL,ZESTRIL) 40 MG tablet TAKE ONE TABLET BY MOUTH ONCE DAILY  90 tablet  1  . Omega-3 Fatty Acids (FISH OIL PO) Take 1,500 mg by mouth daily.        . ONE TOUCH LANCETS MISC Use as instructed to check blood sugar for up to three times per day       . simvastatin (ZOCOR) 10 MG tablet Take 1 tablet (10 mg total) by mouth at bedtime.  90 tablet  3  . vitamin C (ASCORBIC ACID) 500 MG tablet Take 500 mg by mouth 2 (two) times daily.         No current facility-administered medications on file prior to visit.    No Known Allergies  Review of Systems  Review of Systems  Constitutional: Negative for fever and malaise/fatigue.  HENT: Positive for congestion.   Eyes: Negative for discharge.  Respiratory: Negative for shortness of breath.   Cardiovascular: Negative for chest pain, palpitations and leg swelling.  Gastrointestinal: Negative for nausea, abdominal pain and diarrhea.  Genitourinary: Negative for dysuria.  Musculoskeletal: Negative for falls.  Skin: Negative for rash.  Neurological: Negative for loss of consciousness and headaches.   Endo/Heme/Allergies: Negative for polydipsia.  Psychiatric/Behavioral: Negative for depression and suicidal ideas. The patient is not nervous/anxious and does not have insomnia.     Objective  BP 128/82  Pulse 69  Temp(Src) 97.7 F (36.5 C) (Oral)  Ht 5\' 11"  (1.803 m)  Wt 212 lb 1.9 oz (96.217 kg)  BMI 29.60 kg/m2  SpO2 97%  Physical Exam  Physical Exam  Constitutional: He is oriented to person, place, and time and well-developed, well-nourished, and in no distress. No distress.  HENT:  Head: Normocephalic and atraumatic.  Eyes: Conjunctivae are normal.  Neck: Neck supple. No thyromegaly present.  Cardiovascular: Normal rate, regular rhythm and normal heart sounds.   No murmur heard. Pulmonary/Chest: Effort normal and breath sounds normal. No respiratory distress.  Abdominal: He exhibits no distension and no mass. There is no tenderness.  Musculoskeletal: He exhibits no edema.  Neurological: He is alert and oriented to person, place, and time.  Skin: Skin is warm.  Psychiatric: Memory, affect and judgment normal.    Lab Results  Component Value Date   TSH 1.74 09/12/2013   Lab Results  Component Value Date   WBC 6.9 09/12/2013   HGB 15.2 09/12/2013   HCT 44.1 09/12/2013   MCV 88.4 09/12/2013   PLT 254.0 09/12/2013   Lab Results  Component Value Date   CREATININE 1.1 09/12/2013   BUN 18 09/12/2013   NA 137 09/12/2013   K 4.1 09/12/2013   CL 103 09/12/2013   CO2 28 09/12/2013   Lab Results  Component Value Date   ALT 24 09/12/2013   AST 24 09/12/2013   ALKPHOS 47 09/12/2013   BILITOT 1.1 09/12/2013   Lab Results  Component Value Date   CHOL 107 09/12/2013   Lab Results  Component Value Date   HDL 28.60* 09/12/2013   Lab Results  Component Value Date   LDLCALC 67 09/12/2013   Lab Results  Component Value Date   TRIG 56.0 09/12/2013   Lab Results  Component Value Date   CHOLHDL 4 09/12/2013     Assessment & Plan  HYPERTENSION Well controlled, no changes to meds. Encouraged  heart healthy diet such as the DASH diet and exercise as tolerated.   HYPERLIPIDEMIA Tolerating statin, encouraged heart healthy diet, avoid trans fats, minimize simple carbs and  saturated fats. Increase exercise as tolerated  DIABETES MELLITUS, TYPE II, BORDERLINE hgba1c acceptable, minimize simple carbs. Increase exercise as tolerated. Continue current meds  Allergic state Flonase, nasal saline prn

## 2013-09-25 ENCOUNTER — Telehealth: Payer: Self-pay

## 2013-09-25 NOTE — Telephone Encounter (Signed)
Relevant patient education assigned to patient using Emmi. ° °

## 2013-10-18 ENCOUNTER — Other Ambulatory Visit: Payer: Self-pay | Admitting: Family Medicine

## 2013-10-20 ENCOUNTER — Other Ambulatory Visit: Payer: Self-pay | Admitting: Family Medicine

## 2013-10-23 NOTE — Telephone Encounter (Signed)
Lab order

## 2013-11-18 ENCOUNTER — Other Ambulatory Visit: Payer: Self-pay | Admitting: Family Medicine

## 2013-12-16 ENCOUNTER — Telehealth: Payer: Self-pay

## 2013-12-16 ENCOUNTER — Other Ambulatory Visit: Payer: Self-pay

## 2013-12-16 DIAGNOSIS — R7309 Other abnormal glucose: Secondary | ICD-10-CM

## 2013-12-16 DIAGNOSIS — E785 Hyperlipidemia, unspecified: Secondary | ICD-10-CM

## 2013-12-16 DIAGNOSIS — I10 Essential (primary) hypertension: Secondary | ICD-10-CM

## 2013-12-16 NOTE — Telephone Encounter (Signed)
Last avs states to return ([facility]) around 01-19-14 for lipid, renal, cbc, tsh, hepatic, A1C

## 2014-02-17 ENCOUNTER — Other Ambulatory Visit: Payer: Self-pay | Admitting: Family Medicine

## 2014-02-17 NOTE — Telephone Encounter (Signed)
Rx to pharmacy/[facility]

## 2014-02-26 ENCOUNTER — Telehealth: Payer: Self-pay | Admitting: Family Medicine

## 2014-02-26 DIAGNOSIS — E785 Hyperlipidemia, unspecified: Secondary | ICD-10-CM

## 2014-02-26 MED ORDER — SIMVASTATIN 10 MG PO TABS: 10.0000 mg | ORAL_TABLET | Freq: Every day | ORAL | Status: DC

## 2014-02-26 NOTE — Telephone Encounter (Signed)
Refill-simvastatin  [facility] on [address]

## 2014-02-26 NOTE — Telephone Encounter (Signed)
Rx sent 90 day supply no refills.

## 2014-03-14 ENCOUNTER — Other Ambulatory Visit: Payer: Self-pay | Admitting: Family Medicine

## 2014-03-14 ENCOUNTER — Telehealth: Payer: Self-pay | Admitting: Family Medicine

## 2014-03-14 DIAGNOSIS — R351 Nocturia: Secondary | ICD-10-CM

## 2014-03-14 NOTE — Telephone Encounter (Signed)
Please advise 

## 2014-03-14 NOTE — Telephone Encounter (Signed)
Wants to have urinalysis

## 2014-03-17 NOTE — Telephone Encounter (Signed)
OK to add a urinalysis to lab orders for nocturia

## 2014-03-18 NOTE — Telephone Encounter (Signed)
Lab order placed.

## 2014-03-21 ENCOUNTER — Other Ambulatory Visit (INDEPENDENT_AMBULATORY_CARE_PROVIDER_SITE_OTHER): Payer: Self-pay

## 2014-03-21 DIAGNOSIS — R351 Nocturia: Secondary | ICD-10-CM

## 2014-03-21 DIAGNOSIS — R7309 Other abnormal glucose: Secondary | ICD-10-CM

## 2014-03-21 DIAGNOSIS — I10 Essential (primary) hypertension: Secondary | ICD-10-CM

## 2014-03-21 DIAGNOSIS — E785 Hyperlipidemia, unspecified: Secondary | ICD-10-CM

## 2014-03-21 LAB — URINALYSIS, ROUTINE W REFLEX MICROSCOPIC
BILIRUBIN URINE: NEGATIVE
HGB URINE DIPSTICK: NEGATIVE
Ketones, ur: NEGATIVE
Leukocytes, UA: NEGATIVE
NITRITE: NEGATIVE
Specific Gravity, Urine: 1.01 (ref 1.000–1.030)
Total Protein, Urine: NEGATIVE
UROBILINOGEN UA: 0.2 (ref 0.0–1.0)
Urine Glucose: NEGATIVE
pH: 7 (ref 5.0–8.0)

## 2014-03-21 LAB — CBC
HCT: 44.3 % (ref 39.0–52.0)
Hemoglobin: 15.2 g/dL (ref 13.0–17.0)
MCHC: 34.2 g/dL (ref 30.0–36.0)
MCV: 84.9 fl (ref 78.0–100.0)
Platelets: 282 10*3/uL (ref 150.0–400.0)
RBC: 5.22 Mil/uL (ref 4.22–5.81)
RDW: 13.4 % (ref 11.5–15.5)
WBC: 8 10*3/uL (ref 4.0–10.5)

## 2014-03-21 LAB — LIPID PANEL
CHOL/HDL RATIO: 5
CHOLESTEROL: 127 mg/dL (ref 0–200)
HDL: 28.2 mg/dL — ABNORMAL LOW (ref >39.00)
LDL CALC: 77 mg/dL (ref 0–99)
NonHDL: 98.8
Triglycerides: 107 mg/dL (ref 0.0–149.0)
VLDL: 21.4 mg/dL (ref 0.0–40.0)

## 2014-03-21 LAB — RENAL FUNCTION PANEL
Albumin: 4.1 g/dL (ref 3.5–5.2)
BUN: 15 mg/dL (ref 6–23)
CO2: 29 mEq/L (ref 19–32)
Calcium: 9.4 mg/dL (ref 8.4–10.5)
Chloride: 103 mEq/L (ref 96–112)
Creatinine, Ser: 1.1 mg/dL (ref 0.4–1.5)
GFR: 69.82 mL/min (ref >60.00)
GLUCOSE: 128 mg/dL — AB (ref 70–99)
PHOSPHORUS: 3 mg/dL (ref 2.3–4.6)
POTASSIUM: 3.5 meq/L (ref 3.5–5.1)
SODIUM: 139 meq/L (ref 135–145)

## 2014-03-21 LAB — TSH: TSH: 3.97 u[IU]/mL (ref 0.35–4.50)

## 2014-03-21 LAB — HEPATIC FUNCTION PANEL
ALBUMIN: 4.1 g/dL (ref 3.5–5.2)
ALK PHOS: 55 U/L (ref 39–117)
ALT: 26 U/L (ref 0–53)
AST: 23 U/L (ref 0–37)
BILIRUBIN TOTAL: 0.8 mg/dL (ref 0.2–1.2)
Bilirubin, Direct: 0.1 mg/dL (ref 0.0–0.3)
Total Protein: 7.4 g/dL (ref 6.0–8.3)

## 2014-03-21 LAB — HEMOGLOBIN A1C: HEMOGLOBIN A1C: 6.3 % (ref 4.6–6.5)

## 2014-03-25 ENCOUNTER — Ambulatory Visit: Payer: Self-pay | Admitting: Family Medicine

## 2014-04-01 ENCOUNTER — Encounter: Payer: Self-pay | Admitting: Family Medicine

## 2014-04-01 ENCOUNTER — Ambulatory Visit (INDEPENDENT_AMBULATORY_CARE_PROVIDER_SITE_OTHER): Payer: Self-pay | Admitting: Family Medicine

## 2014-04-01 VITALS — BP 143/75 | HR 75 | Temp 97.9°F | Ht 71.0 in | Wt 219.2 lb

## 2014-04-01 DIAGNOSIS — M109 Gout, unspecified: Secondary | ICD-10-CM

## 2014-04-01 DIAGNOSIS — R7309 Other abnormal glucose: Secondary | ICD-10-CM

## 2014-04-01 DIAGNOSIS — M6530 Trigger finger, unspecified finger: Secondary | ICD-10-CM

## 2014-04-01 DIAGNOSIS — M25579 Pain in unspecified ankle and joints of unspecified foot: Secondary | ICD-10-CM

## 2014-04-01 DIAGNOSIS — R252 Cramp and spasm: Secondary | ICD-10-CM

## 2014-04-01 DIAGNOSIS — E785 Hyperlipidemia, unspecified: Secondary | ICD-10-CM

## 2014-04-01 DIAGNOSIS — M25571 Pain in right ankle and joints of right foot: Secondary | ICD-10-CM

## 2014-04-01 DIAGNOSIS — I10 Essential (primary) hypertension: Secondary | ICD-10-CM

## 2014-04-01 DIAGNOSIS — G4733 Obstructive sleep apnea (adult) (pediatric): Secondary | ICD-10-CM

## 2014-04-01 NOTE — Progress Notes (Signed)
Patient ID: Evan Best, male   DOB: 03-Dec-1950, 63 y.o.   MRN: [id number] Evan Best [id number] 11/04/1950 04/01/2014      Progress Note-Follow Up  Subjective  Chief Complaint  Chief Complaint  Patient presents with  . Follow-up    HPI  Patient is a 63 year old male in today for routine medical care. Notes a few changes. Right middle finger is often stiff and difficult to use. Notes a mild increase in constipation, having to strain to use bathroom. Wearing CPAP regularly. No other recent illness or acute concernsl Denies CP/palp/SOB/HA/congestion/fevers/GI or GU c/o. Taking meds as prescribed  Past Medical History  Diagnosis Date  . OSA (obstructive sleep apnea)   . Hypertension   . Migraine headache     remote history  . Diabetes mellitus type II 1/09  . Gout 09/18/2012    Left big toe  . Allergic state 09/22/2013    Past Surgical History  Procedure Laterality Date  . Tonsillectomy  1959  . Hernia repair  [id number]    umbilical hernia repair  . Knee surgery      left knee surgery  . Vasectomy      Family History  Problem Relation Age of Onset  . Breast cancer Mother   . Hypertension Mother   . Hyperlipidemia Father   . Kidney disease Father   . Heart disease Father   . Hypertension Father   . Colon cancer Neg Hx   . Stomach cancer Neg Hx     History   Social History  . Marital Status: Married    Spouse Name: N/A    Number of Children: N/A  . Years of Education: N/A   Occupational History  . Not on file.   Social History Main Topics  . Smoking status: Never Smoker   . Smokeless tobacco: Never Used  . Alcohol Use: No  . Drug Use: No  . Sexual Activity: Not on file   Other Topics Concern  . Not on file   Social History Narrative   Occupation:  Chemical engineer   Married  -  1 daughter (age 25)    Never Smoked   Alcohol use-no     Drug use-no           Current Outpatient Prescriptions on File Prior to Visit  Medication Sig Dispense  Refill  . amLODipine (NORVASC) 10 MG tablet TAKE ONE TABLET BY MOUTH ONCE DAILY  90 tablet  0  . Calcium-Magnesium-Zinc 1000-400-15 MG TABS Take by mouth daily.        . Chelated Potassium 99 MG TABS Take 99 mg by mouth 2 (two) times daily.       . Cinnamon 500 MG capsule Take 500 mg by mouth 2 (two) times daily. +50 mg Chromium in each supplement.      . Coenzyme Q10 (CO Q-10) 100 MG CAPS Take 100 mg by mouth daily.        . Cyanocobalamin (VITAMIN B 12) 250 MCG LOZG Take 250 mcg by mouth 2 (two) times a week.        . fluticasone (FLONASE) 50 MCG/ACT nasal spray Place 2 sprays into both nostrils daily.  16 g  6  . Glucosamine-Chondroit-Vit C-Mn (GLUCOSAMINE CHONDR 1500 COMPLX) CAPS Take by mouth daily. 1500 mg Glucosamine + 1200 mg Chondroitin QD.      Marland Kitchen glucose blood (ONE TOUCH TEST STRIPS) test strip Use as instructed to check blood sugar up to three times  per day       . lisinopril (PRINIVIL,ZESTRIL) 40 MG tablet TAKE ONE TABLET BY MOUTH ONCE DAILY  90 tablet  0  . metFORMIN (GLUCOPHAGE) 500 MG tablet TAKE ONE TABLET BY MOUTH ONCE DAILY WITH BREAKFAST  90 tablet  0  . Omega-3 Fatty Acids (FISH OIL PO) Take 1,500 mg by mouth daily.        . ONE TOUCH LANCETS MISC Use as instructed to check blood sugar for up to three times per day       . OVER THE COUNTER MEDICATION Phosphatidyl choline- 1 capsule bid      . simvastatin (ZOCOR) 10 MG tablet Take 1 tablet (10 mg total) by mouth at bedtime.  90 tablet  0  . vitamin C (ASCORBIC ACID) 500 MG tablet Take 500 mg by mouth 2 (two) times daily.         No current facility-administered medications on file prior to visit.    No Known Allergies  Review of Systems  Review of Systems  Constitutional: Negative for fever and malaise/fatigue.  HENT: Negative for congestion.   Eyes: Negative for discharge.  Respiratory: Negative for shortness of breath.   Cardiovascular: Negative for chest pain, palpitations and leg swelling.  Gastrointestinal:  Negative for nausea, abdominal pain and diarrhea.  Genitourinary: Negative for dysuria.  Musculoskeletal: Positive for joint pain. Negative for falls.       Right middle finger is swollen and painful, in mild contracture hurts more if he tries to straighten it. Right wrist also painful when using computer more. Hurts around the carpal tunnel, no symptoms on left  Skin: Negative for rash.  Neurological: Negative for loss of consciousness and headaches.  Endo/Heme/Allergies: Negative for polydipsia.  Psychiatric/Behavioral: Negative for depression and suicidal ideas. The patient is not nervous/anxious and does not have insomnia.     Objective  BP 143/75  Pulse 75  Temp(Src) 97.9 F (36.6 C) (Oral)  Ht 5\' 11"  (1.803 m)  Wt 219 lb 3.2 oz (99.428 kg)  BMI 30.59 kg/m2  SpO2 96%  Physical Exam  Physical Exam  Constitutional: He is oriented to person, place, and time and well-developed, well-nourished, and in no distress. No distress.  HENT:  Head: Normocephalic and atraumatic.  Eyes: Conjunctivae are normal.  Neck: Neck supple. No thyromegaly present.  Cardiovascular: Normal rate, regular rhythm and normal heart sounds.   No murmur heard. Pulmonary/Chest: Effort normal and breath sounds normal. No respiratory distress.  Abdominal: He exhibits no distension and no mass. There is no tenderness.  Musculoskeletal: He exhibits no edema.  Neurological: He is alert and oriented to person, place, and time.  Skin: Skin is warm.  Psychiatric: Memory, affect and judgment normal.    Lab Results  Component Value Date   TSH 3.97 03/21/2014   Lab Results  Component Value Date   WBC 8.0 03/21/2014   HGB 15.2 03/21/2014   HCT 44.3 03/21/2014   MCV 84.9 03/21/2014   PLT 282.0 03/21/2014   Lab Results  Component Value Date   CREATININE 1.1 03/21/2014   BUN 15 03/21/2014   NA 139 03/21/2014   K 3.5 03/21/2014   CL 103 03/21/2014   CO2 29 03/21/2014   Lab Results  Component Value Date   ALT 26  03/21/2014   AST 23 03/21/2014   ALKPHOS 55 03/21/2014   BILITOT 0.8 03/21/2014   Lab Results  Component Value Date   CHOL 127 03/21/2014   Lab Results  Component  Value Date   HDL 28.20* 03/21/2014   Lab Results  Component Value Date   LDLCALC 77 03/21/2014   Lab Results  Component Value Date   TRIG 107.0 03/21/2014   Lab Results  Component Value Date   CHOLHDL 5 03/21/2014     Assessment & Plan   SLEEP APNEA, OBSTRUCTIVE Using CPAP regularly  HYPERTENSION Well controlled, no changes to meds. Encouraged heart healthy diet such as the DASH diet and exercise as tolerated.   DIABETES MELLITUS, TYPE II, BORDERLINE hgba1c acceptable, minimize simple carbs. Increase exercise as tolerated. Continue current meds  HYPERLIPIDEMIA Tolerating statin, encouraged heart healthy diet, avoid trans fats, minimize simple carbs and saturated fats. Increase exercise as tolerated  Trigger finger Right middle finger has flared recently is contracted and when he tries to straighten it. No redness or warmth  MUSCLE CRAMPS Left hamstring cramps regularly, increase hydration and consider adding Magnesium  Gout No recent flare  Pain in joint, ankle and foot Encouraged salon pas, ice and consider shoe inserts

## 2014-04-01 NOTE — Assessment & Plan Note (Signed)
Left hamstring cramps regularly, increase hydration and consider adding Magnesium

## 2014-04-01 NOTE — Progress Notes (Signed)
Pre visit review using our clinic review tool, if applicable. No additional management support is needed unless otherwise documented below in the visit note. 

## 2014-04-01 NOTE — Assessment & Plan Note (Signed)
Right middle finger has flared recently is contracted and when he tries to straighten it. No redness or warmth

## 2014-04-01 NOTE — Assessment & Plan Note (Signed)
Using CPAP regularly

## 2014-04-01 NOTE — Assessment & Plan Note (Signed)
Tolerating statin, encouraged heart healthy diet, avoid trans fats, minimize simple carbs and saturated fats. Increase exercise as tolerated 

## 2014-04-01 NOTE — Assessment & Plan Note (Signed)
Well controlled, no changes to meds. Encouraged heart healthy diet such as the DASH diet and exercise as tolerated.  °

## 2014-04-01 NOTE — Assessment & Plan Note (Signed)
hgba1c acceptable, minimize simple carbs. Increase exercise as tolerated. Continue current meds 

## 2014-04-01 NOTE — Patient Instructions (Addendum)
Hydrate well, at least 64 oz of clear fluids daily. Add a probiotic if not taking one, can try a Cranberry tab daily and call if urine worsens  Cetirizine daily and on a bad can take a second one  Curcumen capsul daily, [place] is good can order at [facility] or can buy locally Consider a fiber supplements such as Metamucil/Psyllium or Benefiber  Dupuytren's Contracture Dupuytren's contracture affects the fingers and the palm of the hand. This condition usually develops slowly. It may take many years to develop. The pinky finger and the ring finger are most often affected. These fingers start to curve inward, like a claw. At some point, the fingers cannot go straight anymore. This can make it hard to do things like:  Put on gloves.  Shake hands.  Grab something off a shelf. The condition usually does not cause pain and is not dangerous. The condition gets its name from the doctor who came up with an operation to fix the problem. His name was Lanney Gins Dupuytren. Contracture means pulling inward. CAUSES  Dupuytren's contracture does not start with the fingers. It starts in the palm of the hand, under the skin. The tissue under the skin is called fascia. The fascia covers the cords (tendons) that control how the fingers move. In Dupuytren's contracture the fascia tissue becomes thick and then pulls on the cords. That causes the fingers to curl. The condition can affect both hands and any fingers, but it usually strikes one hand worse than the other. The fingers farthest from the thumb are most often the ones that curl. The cause is not clear. Some experts believe it results from an autoimmune reaction. That means the body's immune system (which fights off disease) attacks itself by mistake. What experts do know is that certain conditions and behaviors (called risk factors) make the chance of having this condition more likely. They include:  Age. Most people who have the condition are  older than 50.  Sex. It affects men more often than women.  Family history. The condition tends to run in families from countries in [place] and [place].  Certain behaviors. People who smoke and drink alcohol are more apt to develop the problem.  Some other medical conditions. Having diabetes makes Dupuytren's contracture more likely. So does having a condition that involves a seizure (when the brain's function is interrupted). SYMPTOMS  Signs of this condition take time to develop. Sometimes this takes weeks or months. More often, it takes several years.   Early symptoms:  Skin on the palm of the hand becomes thick. This is usually the first sign.  The skin may look dimpled or puckered.  Lumps (nodules) show up on the palm. There may be one or more lumps. They are not painful.  Later symptoms:  Thick cords of tissue form in the palm of the hand.  The pinky and ring fingers start to curl up into the palm.  The fingers cannot be straightened into their normal position. DIAGNOSIS  A physical examination is the main way that a healthcare provider can tell if you have Dupuytren's contracture. Other tests usually are not needed. The caregiver will probably:  Look at your hands. Feel your hands. This is to check for thickening and nodules.  Measure finger motion. This tells how much your fingers have contracted (pulled in).  Do a tabletop test. You will be asked to try to put your hand flat on a table, palm down. TREATMENT  There is  no cure for Dupuytren's contracture. But there are ways to treat the symptoms. Options include:  Watching and waiting. The condition develops slowly. Often it does not create problems for a long time. Sometimes the skin gets thick and nodules form, but the fingers never curl. So, in some cases it is best to just watch the condition carefully and wait to see what happens.  Shots (injections). Different substances may be injected,  including:  Steroids. These drugs block swelling. These shots should make the condition less uncomfortable. Steroids may also slow down the condition. Shots are given into the nodules. The effect only lasts awhile. More shots may have to be given.  Enzymes. These are proteins. They weaken the thick tissue. After an injection, the caregiver usually stretches the fingers.  Needling. A needle is pushed through the skin and into the thick tissue. This is done in several spots. The goal is to break up the thickened tissue. Or to weaken it.  Surgery. This may be suggested if you cannot grasp objects. Or, if you can no longer put your hand in your pocket.  A cut (incision) is made in the palm of the hand. The thick tissue is removed.  Sometimes the thick tissue is attached to the skin. Then, the skin must be removed, too. It is replaced with a piece of skin from another place on your body. That is called a skin graft.  Occupational or hand therapy is almost always needed after surgery. This involves special exercises to get back the use of your hand and fingers. After a skin graft, several months of therapy may be needed.  Sometimes the condition comes back, even after surgery.  Other methods. You can do some things on your own. They include:  Stretching the fingers backwards. Do this often.  Warming the hand and massaging it. Again, do this often.  Using tools with padded grips. This should make things easier.  Wearing heavy gloves while working. This protects the hands. PROGNOSIS  Dupuytren's contracture usually develops slowly. There is no cure. But, the symptoms can be treated. Sometimes they come back after treatment, but not always. It is important to remember that this is a functional problem and not a life-threatening condition. Document Released: 04/24/2009 Document Revised: 09/19/2011 Document Reviewed: 04/24/2009 [facility] Patient Information 2015 [place], [place]. This information is  not intended to replace advice given to you by your health care provider. Make sure you discuss any questions you have with your health care provider.

## 2014-04-02 LAB — URIC ACID: Uric Acid, Serum: 6.4 mg/dL (ref 4.0–7.8)

## 2014-04-06 DIAGNOSIS — M25579 Pain in unspecified ankle and joints of unspecified foot: Secondary | ICD-10-CM | POA: Insufficient documentation

## 2014-04-06 NOTE — Assessment & Plan Note (Signed)
Encouraged salon pas, ice and consider shoe inserts

## 2014-04-06 NOTE — Assessment & Plan Note (Signed)
No recent flare.  

## 2014-04-30 ENCOUNTER — Other Ambulatory Visit: Payer: Self-pay | Admitting: Family Medicine

## 2014-05-16 ENCOUNTER — Ambulatory Visit: Payer: Self-pay | Admitting: Internal Medicine

## 2014-05-19 ENCOUNTER — Other Ambulatory Visit: Payer: Self-pay | Admitting: Family Medicine

## 2014-05-26 ENCOUNTER — Other Ambulatory Visit: Payer: Self-pay | Admitting: Family Medicine

## 2014-06-13 ENCOUNTER — Other Ambulatory Visit: Payer: Self-pay | Admitting: Family Medicine

## 2014-07-30 ENCOUNTER — Other Ambulatory Visit: Payer: Self-pay | Admitting: Family Medicine

## 2014-07-30 NOTE — Telephone Encounter (Signed)
Patient scheduled for physical 09/05/14, so Rx filled until appointment.   eal

## 2014-08-18 ENCOUNTER — Other Ambulatory Visit: Payer: Self-pay | Admitting: Family Medicine

## 2014-08-26 ENCOUNTER — Other Ambulatory Visit: Payer: Self-pay | Admitting: Family Medicine

## 2014-09-01 ENCOUNTER — Other Ambulatory Visit (INDEPENDENT_AMBULATORY_CARE_PROVIDER_SITE_OTHER): Payer: Self-pay

## 2014-09-01 DIAGNOSIS — R252 Cramp and spasm: Secondary | ICD-10-CM

## 2014-09-01 DIAGNOSIS — E785 Hyperlipidemia, unspecified: Secondary | ICD-10-CM

## 2014-09-01 DIAGNOSIS — M25571 Pain in right ankle and joints of right foot: Secondary | ICD-10-CM

## 2014-09-01 DIAGNOSIS — G4733 Obstructive sleep apnea (adult) (pediatric): Secondary | ICD-10-CM

## 2014-09-01 DIAGNOSIS — I10 Essential (primary) hypertension: Secondary | ICD-10-CM

## 2014-09-01 DIAGNOSIS — R7309 Other abnormal glucose: Secondary | ICD-10-CM

## 2014-09-01 DIAGNOSIS — M6530 Trigger finger, unspecified finger: Secondary | ICD-10-CM

## 2014-09-01 LAB — RENAL FUNCTION PANEL
ALBUMIN: 4.2 g/dL (ref 3.5–5.2)
BUN: 13 mg/dL (ref 6–23)
CALCIUM: 9.3 mg/dL (ref 8.4–10.5)
CO2: 24 meq/L (ref 19–32)
Chloride: 105 mEq/L (ref 96–112)
Creatinine, Ser: 1.1 mg/dL (ref 0.40–1.50)
GFR: 71.92 mL/min (ref >60.00)
Glucose, Bld: 164 mg/dL — ABNORMAL HIGH (ref 70–99)
POTASSIUM: 4 meq/L (ref 3.5–5.1)
Phosphorus: 3.4 mg/dL (ref 2.3–4.6)
SODIUM: 140 meq/L (ref 135–145)

## 2014-09-01 LAB — CBC
HEMATOCRIT: 42.2 % (ref 39.0–52.0)
HEMOGLOBIN: 14.8 g/dL (ref 13.0–17.0)
MCHC: 35 g/dL (ref 30.0–36.0)
MCV: 81.7 fl (ref 78.0–100.0)
PLATELETS: 264 10*3/uL (ref 150.0–400.0)
RBC: 5.17 Mil/uL (ref 4.22–5.81)
RDW: 14 % (ref 11.5–15.5)
WBC: 7.3 10*3/uL (ref 4.0–10.5)

## 2014-09-01 LAB — LIPID PANEL
CHOL/HDL RATIO: 4
CHOLESTEROL: 126 mg/dL (ref 0–200)
HDL: 29.3 mg/dL — ABNORMAL LOW (ref >39.00)
LDL Cholesterol: 67 mg/dL (ref 0–99)
NonHDL: 96.7
Triglycerides: 147 mg/dL (ref 0.0–149.0)
VLDL: 29.4 mg/dL (ref 0.0–40.0)

## 2014-09-01 LAB — HEPATIC FUNCTION PANEL
ALBUMIN: 4.2 g/dL (ref 3.5–5.2)
ALK PHOS: 65 U/L (ref 39–117)
ALT: 31 U/L (ref 0–53)
AST: 20 U/L (ref 0–37)
Bilirubin, Direct: 0.2 mg/dL (ref 0.0–0.3)
TOTAL PROTEIN: 7.4 g/dL (ref 6.0–8.3)
Total Bilirubin: 0.6 mg/dL (ref 0.2–1.2)

## 2014-09-01 LAB — TSH: TSH: 2.41 u[IU]/mL (ref 0.35–4.50)

## 2014-09-01 LAB — HEMOGLOBIN A1C: HEMOGLOBIN A1C: 7.2 % — AB (ref 4.6–6.5)

## 2014-09-01 LAB — URIC ACID: URIC ACID, SERUM: 6.4 mg/dL (ref 4.0–7.8)

## 2014-09-05 ENCOUNTER — Ambulatory Visit (INDEPENDENT_AMBULATORY_CARE_PROVIDER_SITE_OTHER): Payer: Self-pay | Admitting: Family Medicine

## 2014-09-05 ENCOUNTER — Encounter: Payer: Self-pay | Admitting: Family Medicine

## 2014-09-05 VITALS — BP 132/80 | HR 85 | Temp 98.2°F | Ht 71.0 in | Wt 214.4 lb

## 2014-09-05 DIAGNOSIS — G4733 Obstructive sleep apnea (adult) (pediatric): Secondary | ICD-10-CM

## 2014-09-05 DIAGNOSIS — E119 Type 2 diabetes mellitus without complications: Secondary | ICD-10-CM

## 2014-09-05 DIAGNOSIS — M25511 Pain in right shoulder: Secondary | ICD-10-CM

## 2014-09-05 DIAGNOSIS — E1169 Type 2 diabetes mellitus with other specified complication: Secondary | ICD-10-CM

## 2014-09-05 DIAGNOSIS — Z0000 Encounter for general adult medical examination without abnormal findings: Secondary | ICD-10-CM | POA: Insufficient documentation

## 2014-09-05 DIAGNOSIS — E669 Obesity, unspecified: Secondary | ICD-10-CM

## 2014-09-05 DIAGNOSIS — E782 Mixed hyperlipidemia: Secondary | ICD-10-CM

## 2014-09-05 DIAGNOSIS — M25571 Pain in right ankle and joints of right foot: Secondary | ICD-10-CM

## 2014-09-05 DIAGNOSIS — T7840XD Allergy, unspecified, subsequent encounter: Secondary | ICD-10-CM

## 2014-09-05 DIAGNOSIS — I10 Essential (primary) hypertension: Secondary | ICD-10-CM

## 2014-09-05 HISTORY — DX: Encounter for general adult medical examination without abnormal findings: Z00.00

## 2014-09-05 NOTE — Progress Notes (Signed)
Pre visit review using our clinic review tool, if applicable. No additional management support is needed unless otherwise documented below in the visit note. 

## 2014-09-05 NOTE — Assessment & Plan Note (Signed)
Well controlled, no changes to meds. Encouraged heart healthy diet such as the DASH diet and exercise as tolerated.  °

## 2014-09-05 NOTE — Progress Notes (Signed)
Evan Best  [id number] 04/11/1951 09/05/2014      Progress Note-Follow Up  Subjective  Chief Complaint  Chief Complaint  Patient presents with  . Annual Exam    HPI  Patient is a 64 y.o. male in today for routine medical care. Patient is in today for follow-up. Notes he continues to struggle with sinus congestion and did not find Flonase helpful. No fevers or chills. No ear pain or sore throat. Continues to struggle with foot pain most notably in his right ankle. Has ongoing right shoulder pain as well. Notes it improves when he sleeps on his left side. It is been progressing over a long period of time. Tries to eat a heart healthy diet and maintain activity as tolerated.  Past Medical History  Diagnosis Date  . OSA (obstructive sleep apnea)   . Hypertension   . Migraine headache     remote history  . Diabetes mellitus type II 1/09  . Gout 09/18/2012    Left big toe  . Allergic state 09/22/2013    Past Surgical History  Procedure Laterality Date  . Tonsillectomy  1959  . Hernia repair  2353    umbilical hernia repair  . Knee surgery      left knee surgery  . Vasectomy      Family History  Problem Relation Age of Onset  . Breast cancer Mother   . Hypertension Mother   . Hyperlipidemia Father   . Kidney disease Father   . Heart disease Father   . Hypertension Father   . Colon cancer Neg Hx   . Stomach cancer Neg Hx     History   Social History  . Marital Status: Married    Spouse Name: N/A  . Number of Children: N/A  . Years of Education: N/A   Occupational History  . Not on file.   Social History Main Topics  . Smoking status: Never Smoker   . Smokeless tobacco: Never Used  . Alcohol Use: No  . Drug Use: No  . Sexual Activity: Not on file   Other Topics Concern  . Not on file   Social History Narrative   Occupation:  Chemical engineer   Married  -  1 daughter (age 56)    Never Smoked   Alcohol use-no     Drug use-no            Current Outpatient Prescriptions on File Prior to Visit  Medication Sig Dispense Refill  . amLODipine (NORVASC) 10 MG tablet TAKE ONE TABLET BY MOUTH ONCE DAILY 90 tablet 0  . Calcium-Magnesium-Zinc 1000-400-15 MG TABS Take by mouth daily.      . Chelated Potassium 99 MG TABS Take 99 mg by mouth 2 (two) times daily.     . Cinnamon 500 MG capsule Take 500 mg by mouth 2 (two) times daily. +50 mg Chromium in each supplement.    . Coenzyme Q10 (CO Q-10) 100 MG CAPS Take 100 mg by mouth daily.      . Cyanocobalamin (VITAMIN B 12) 250 MCG LOZG Take 250 mcg by mouth 2 (two) times a week.      . fluticasone (FLONASE) 50 MCG/ACT nasal spray Place 2 sprays into both nostrils daily. 16 g 6  . Glucosamine-Chondroit-Vit C-Mn (GLUCOSAMINE CHONDR 1500 COMPLX) CAPS Take by mouth daily. 1500 mg Glucosamine + 1200 mg Chondroitin QD.    Marland Kitchen glucose blood (ONE TOUCH TEST STRIPS) test strip Use as instructed to check blood  sugar up to three times per day     . lisinopril (PRINIVIL,ZESTRIL) 40 MG tablet TAKE ONE TABLET BY MOUTH ONCE DAILY. 90 tablet 1  . metFORMIN (GLUCOPHAGE) 500 MG tablet TAKE ONE TABLET BY MOUTH ONCE DAILY WITH  BREAKFAST 90 tablet 0  . Omega-3 Fatty Acids (FISH OIL PO) Take 1,500 mg by mouth daily.      . ONE TOUCH LANCETS MISC Use as instructed to check blood sugar for up to three times per day     . OVER THE COUNTER MEDICATION Phosphatidyl choline- 1 capsule bid    . simvastatin (ZOCOR) 10 MG tablet TAKE ONE TABLET BY MOUTH AT BEDTIME 90 tablet 2  . vitamin C (ASCORBIC ACID) 500 MG tablet Take 500 mg by mouth 2 (two) times daily.       No current facility-administered medications on file prior to visit.    No Known Allergies  Review of Systems  Review of Systems  Constitutional: Negative for fever, chills and malaise/fatigue.  HENT: Negative for congestion, hearing loss and nosebleeds.   Eyes: Negative for discharge.  Respiratory: Negative for cough, sputum production, shortness of  breath and wheezing.   Cardiovascular: Negative for chest pain, palpitations and leg swelling.  Gastrointestinal: Negative for heartburn, nausea, vomiting, abdominal pain, diarrhea, constipation and blood in stool.  Genitourinary: Negative for dysuria, urgency, frequency and hematuria.  Musculoskeletal: Negative for myalgias, back pain and falls.  Skin: Negative for rash.  Neurological: Negative for dizziness, tremors, sensory change, focal weakness, loss of consciousness, weakness and headaches.  Endo/Heme/Allergies: Negative for polydipsia. Does not bruise/bleed easily.  Psychiatric/Behavioral: Negative for depression and suicidal ideas. The patient is not nervous/anxious and does not have insomnia.     Objective  BP 132/80 mmHg  Pulse 85  Temp(Src) 98.2 F (36.8 C) (Oral)  Ht 5\' 11"  (1.803 m)  Wt 214 lb 6 oz (97.24 kg)  BMI 29.91 kg/m2  SpO2 95%  Physical Exam  Physical Exam  Constitutional: He is oriented to person, place, and time and well-developed, well-nourished, and in no distress. No distress.  HENT:  Head: Normocephalic and atraumatic.  Eyes: Conjunctivae are normal.  Neck: Neck supple. No thyromegaly present.  Cardiovascular: Normal rate, regular rhythm and normal heart sounds.   No murmur heard. Pulmonary/Chest: Effort normal and breath sounds normal. No respiratory distress.  Abdominal: He exhibits no distension and no mass. There is no tenderness.  Musculoskeletal: He exhibits no edema.  Neurological: He is alert and oriented to person, place, and time.  Skin: Skin is warm.  Psychiatric: Memory, affect and judgment normal.    Lab Results  Component Value Date   TSH 2.41 09/01/2014   Lab Results  Component Value Date   WBC 7.3 09/01/2014   HGB 14.8 09/01/2014   HCT 42.2 09/01/2014   MCV 81.7 09/01/2014   PLT 264.0 09/01/2014   Lab Results  Component Value Date   CREATININE 1.10 09/01/2014   BUN 13 09/01/2014   NA 140 09/01/2014   K 4.0 09/01/2014    CL 105 09/01/2014   CO2 24 09/01/2014   Lab Results  Component Value Date   ALT 31 09/01/2014   AST 20 09/01/2014   ALKPHOS 65 09/01/2014   BILITOT 0.6 09/01/2014   Lab Results  Component Value Date   CHOL 126 09/01/2014   Lab Results  Component Value Date   HDL 29.30* 09/01/2014   Lab Results  Component Value Date   LDLCALC 67 09/01/2014  Lab Results  Component Value Date   TRIG 147.0 09/01/2014   Lab Results  Component Value Date   CHOLHDL 4 09/01/2014     Assessment & Plan  Essential hypertension Well controlled, no changes to meds. Encouraged heart healthy diet such as the DASH diet and exercise as tolerated.    Hyperlipidemia, mixed Tolerating statin, encouraged heart healthy diet, avoid trans fats, minimize simple carbs and saturated fats. Increase exercise as tolerated   Diabetes mellitus type 2 in obese hgba1c acceptable, minimize simple carbs. Increase exercise as tolerated. Continue current meds. A1C has increased above 7 to 7.2, needs to redouble efforts to avoid carbohydrates and recheck A1C in 3 months.   Allergic state Is struggling with increased congestion but no rhinorrhea. Stopped Flonase thinking it was not helping but has restarted it. Is offered a referral to ENT for consideration or alteration of meds and declines.    Obstructive sleep apnea His CPAP machine took a tumble off of a shelf but appears to continue to function. Has not had a sleep study since 2008, offered a referral back to pulmonology, but without health insurance he declines referral he will let us know if he is ready to go. He will let us know if he wants a referral. He uses his cpap regularly   Right shoulder pain Managing with some lifestyle modifications, uses other arm and does not have any radicular symptoms. He declines treatment or referral   Pain in joint, ankle and foot Worsening, Ibuprofen prn helps but he feels causes constipation, encouraged to consider  Salon Pas gel   Preventative health care Patient encouraged to maintain heart healthy diet, regular exercise, adequate sleep. Consider daily probiotics. Take medications as prescribed. Labs reviewed

## 2014-09-05 NOTE — Assessment & Plan Note (Signed)
hgba1c acceptable, minimize simple carbs. Increase exercise as tolerated. Continue current meds. A1C has increased above 7 to 7.2, needs to redouble efforts to avoid carbohydrates and recheck A1C in 3 months.

## 2014-09-05 NOTE — Assessment & Plan Note (Signed)
Is struggling with increased congestion but no rhinorrhea. Stopped Flonase thinking it was not helping but has restarted it. Is offered a referral to ENT for consideration or alteration of meds and declines.

## 2014-09-05 NOTE — Patient Instructions (Signed)
Preventive Care for Adults A healthy lifestyle and preventive care can promote health and wellness. Preventive health guidelines for men include the following key practices:  A routine yearly physical is a good way to check with your health care provider about your health and preventative screening. It is a chance to share any concerns and updates on your health and to receive a thorough exam.  Visit your dentist for a routine exam and preventative care every 6 months. Brush your teeth twice a day and floss once a day. Good oral hygiene prevents tooth decay and gum disease.  The frequency of eye exams is based on your age, health, family medical history, use of contact lenses, and other factors. Follow your health care provider's recommendations for frequency of eye exams.  Eat a healthy diet. Foods such as vegetables, fruits, whole grains, low-fat dairy products, and lean protein foods contain the nutrients you need without too many calories. Decrease your intake of foods high in solid fats, added sugars, and salt. Eat the right amount of calories for you.Get information about a proper diet from your health care provider, if necessary.  Regular physical exercise is one of the most important things you can do for your health. Most adults should get at least 150 minutes of moderate-intensity exercise (any activity that increases your heart rate and causes you to sweat) each week. In addition, most adults need muscle-strengthening exercises on 2 or more days a week.  Maintain a healthy weight. The body mass index (BMI) is a screening tool to identify possible weight problems. It provides an estimate of body fat based on height and weight. Your health care provider can find your BMI and can help you achieve or maintain a healthy weight.For adults 20 years and older:  A BMI below 18.5 is considered underweight.  A BMI of 18.5 to 24.9 is normal.  A BMI of 25 to 29.9 is considered overweight.  A BMI  of 30 and above is considered obese.  Maintain normal blood lipids and cholesterol levels by exercising and minimizing your intake of saturated fat. Eat a balanced diet with plenty of fruit and vegetables. Blood tests for lipids and cholesterol should begin at age 50 and be repeated every 5 years. If your lipid or cholesterol levels are high, you are over 50, or you are at high risk for heart disease, you may need your cholesterol levels checked more frequently.Ongoing high lipid and cholesterol levels should be treated with medicines if diet and exercise are not working.  If you smoke, find out from your health care provider how to quit. If you do not use tobacco, do not start.  Lung cancer screening is recommended for adults aged 73-80 years who are at high risk for developing lung cancer because of a history of smoking. A yearly low-dose CT scan of the lungs is recommended for people who have at least a 30-pack-year history of smoking and are a current smoker or have quit within the past 15 years. A pack year of smoking is smoking an average of 1 pack of cigarettes a day for 1 year (for example: 1 pack a day for 30 years or 2 packs a day for 15 years). Yearly screening should continue until the smoker has stopped smoking for at least 15 years. Yearly screening should be stopped for people who develop a health problem that would prevent them from having lung cancer treatment.  If you choose to drink alcohol, do not have more than  2 drinks per day. One drink is considered to be 12 ounces (355 mL) of beer, 5 ounces (148 mL) of wine, or 1.5 ounces (44 mL) of liquor.  Avoid use of street drugs. Do not share needles with anyone. Ask for help if you need support or instructions about stopping the use of drugs.  High blood pressure causes heart disease and increases the risk of stroke. Your blood pressure should be checked at least every 1-2 years. Ongoing high blood pressure should be treated with  medicines, if weight loss and exercise are not effective.  If you are 45-79 years old, ask your health care provider if you should take aspirin to prevent heart disease.  Diabetes screening involves taking a blood sample to check your fasting blood sugar level. This should be done once every 3 years, after age 45, if you are within normal weight and without risk factors for diabetes. Testing should be considered at a younger age or be carried out more frequently if you are overweight and have at least 1 risk factor for diabetes.  Colorectal cancer can be detected and often prevented. Most routine colorectal cancer screening begins at the age of 50 and continues through age 75. However, your health care provider may recommend screening at an earlier age if you have risk factors for colon cancer. On a yearly basis, your health care provider may provide home test kits to check for hidden blood in the stool. Use of a small camera at the end of a tube to directly examine the colon (sigmoidoscopy or colonoscopy) can detect the earliest forms of colorectal cancer. Talk to your health care provider about this at age 50, when routine screening begins. Direct exam of the colon should be repeated every 5-10 years through age 75, unless early forms of precancerous polyps or small growths are found.  People who are at an increased risk for hepatitis B should be screened for this virus. You are considered at high risk for hepatitis B if:  You were born in a country where hepatitis B occurs often. Talk with your health care provider about which countries are considered high risk.  Your parents were born in a high-risk country and you have not received a shot to protect against hepatitis B (hepatitis B vaccine).  You have HIV or AIDS.  You use needles to inject street drugs.  You live with, or have sex with, someone who has hepatitis B.  You are a man who has sex with other men (MSM).  You get hemodialysis  treatment.  You take certain medicines for conditions such as cancer, organ transplantation, and autoimmune conditions.  Hepatitis C blood testing is recommended for all people born from 1945 through 1965 and any individual with known risks for hepatitis C.  Practice safe sex. Use condoms and avoid high-risk sexual practices to reduce the spread of sexually transmitted infections (STIs). STIs include gonorrhea, chlamydia, syphilis, trichomonas, herpes, HPV, and human immunodeficiency virus (HIV). Herpes, HIV, and HPV are viral illnesses that have no cure. They can result in disability, cancer, and death.  If you are at risk of being infected with HIV, it is recommended that you take a prescription medicine daily to prevent HIV infection. This is called preexposure prophylaxis (PrEP). You are considered at risk if:  You are a man who has sex with other men (MSM) and have other risk factors.  You are a heterosexual man, are sexually active, and are at increased risk for HIV infection.    You take drugs by injection.  You are sexually active with a partner who has HIV.  Talk with your health care provider about whether you are at high risk of being infected with HIV. If you choose to begin PrEP, you should first be tested for HIV. You should then be tested every 3 months for as long as you are taking PrEP.  A one-time screening for abdominal aortic aneurysm (AAA) and surgical repair of large AAAs by ultrasound are recommended for men ages 32 to 67 years who are current or former smokers.  Healthy men should no longer receive prostate-specific antigen (PSA) blood tests as part of routine cancer screening. Talk with your health care provider about prostate cancer screening.  Testicular cancer screening is not recommended for adult males who have no symptoms. Screening includes self-exam, a health care provider exam, and other screening tests. Consult with your health care provider about any symptoms  you have or any concerns you have about testicular cancer.  Use sunscreen. Apply sunscreen liberally and repeatedly throughout the day. You should seek shade when your shadow is shorter than you. Protect yourself by wearing long sleeves, pants, a wide-brimmed hat, and sunglasses year round, whenever you are outdoors.  Once a month, do a whole-body skin exam, using a mirror to look at the skin on your back. Tell your health care provider about new moles, moles that have irregular borders, moles that are larger than a pencil eraser, or moles that have changed in shape or color.  Stay current with required vaccines (immunizations).  Influenza vaccine. All adults should be immunized every year.  Tetanus, diphtheria, and acellular pertussis (Td, Tdap) vaccine. An adult who has not previously received Tdap or who does not know his vaccine status should receive 1 dose of Tdap. This initial dose should be followed by tetanus and diphtheria toxoids (Td) booster doses every 10 years. Adults with an unknown or incomplete history of completing a 3-dose immunization series with Td-containing vaccines should begin or complete a primary immunization series including a Tdap dose. Adults should receive a Td booster every 10 years.  Varicella vaccine. An adult without evidence of immunity to varicella should receive 2 doses or a second dose if he has previously received 1 dose.  Human papillomavirus (HPV) vaccine. Males aged 68-21 years who have not received the vaccine previously should receive the 3-dose series. Males aged 22-26 years may be immunized. Immunization is recommended through the age of 6 years for any male who has sex with males and did not get any or all doses earlier. Immunization is recommended for any person with an immunocompromised condition through the age of 49 years if he did not get any or all doses earlier. During the 3-dose series, the second dose should be obtained 4-8 weeks after the first  dose. The third dose should be obtained 24 weeks after the first dose and 16 weeks after the second dose.  Zoster vaccine. One dose is recommended for adults aged 50 years or older unless certain conditions are present.  Measles, mumps, and rubella (MMR) vaccine. Adults born before [birth year] generally are considered immune to measles and mumps. Adults born in [birth year] or later should have 1 or more doses of MMR vaccine unless there is a contraindication to the vaccine or there is laboratory evidence of immunity to each of the three diseases. A routine second dose of MMR vaccine should be obtained at least 28 days after the first dose for students attending postsecondary  schools, health care workers, or international travelers. People who received inactivated measles vaccine or an unknown type of measles vaccine during 1963-1967 should receive 2 doses of MMR vaccine. People who received inactivated mumps vaccine or an unknown type of mumps vaccine before 1979 and are at high risk for mumps infection should consider immunization with 2 doses of MMR vaccine. Unvaccinated health care workers born before 1957 who lack laboratory evidence of measles, mumps, or rubella immunity or laboratory confirmation of disease should consider measles and mumps immunization with 2 doses of MMR vaccine or rubella immunization with 1 dose of MMR vaccine.  Pneumococcal 13-valent conjugate (PCV13) vaccine. When indicated, a person who is uncertain of his immunization history and has no record of immunization should receive the PCV13 vaccine. An adult aged 19 years or older who has certain medical conditions and has not been previously immunized should receive 1 dose of PCV13 vaccine. This PCV13 should be followed with a dose of pneumococcal polysaccharide (PPSV23) vaccine. The PPSV23 vaccine dose should be obtained at least 8 weeks after the dose of PCV13 vaccine. An adult aged 19 years or older who has certain medical conditions and  previously received 1 or more doses of PPSV23 vaccine should receive 1 dose of PCV13. The PCV13 vaccine dose should be obtained 1 or more years after the last PPSV23 vaccine dose.  Pneumococcal polysaccharide (PPSV23) vaccine. When PCV13 is also indicated, PCV13 should be obtained first. All adults aged 65 years and older should be immunized. An adult younger than age 65 years who has certain medical conditions should be immunized. Any person who resides in a nursing home or long-term care facility should be immunized. An adult smoker should be immunized. People with an immunocompromised condition and certain other conditions should receive both PCV13 and PPSV23 vaccines. People with human immunodeficiency virus (HIV) infection should be immunized as soon as possible after diagnosis. Immunization during chemotherapy or radiation therapy should be avoided. Routine use of PPSV23 vaccine is not recommended for American Indians, [place] Natives, or people younger than 65 years unless there are medical conditions that require PPSV23 vaccine. When indicated, people who have unknown immunization and have no record of immunization should receive PPSV23 vaccine. One-time revaccination 5 years after the first dose of PPSV23 is recommended for people aged 19-64 years who have chronic kidney failure, nephrotic syndrome, asplenia, or immunocompromised conditions. People who received 1-2 doses of PPSV23 before age 65 years should receive another dose of PPSV23 vaccine at age 65 years or later if at least 5 years have passed since the previous dose. Doses of PPSV23 are not needed for people immunized with PPSV23 at or after age 65 years.  Meningococcal vaccine. Adults with asplenia or persistent complement component deficiencies should receive 2 doses of quadrivalent meningococcal conjugate (MenACWY-D) vaccine. The doses should be obtained at least 2 months apart. Microbiologists working with certain meningococcal bacteria,  military recruits, people at risk during an outbreak, and people who travel to or live in countries with a high rate of meningitis should be immunized. A first-year college student up through age 21 years who is living in a residence hall should receive a dose if he did not receive a dose on or after his 16th birthday. Adults who have certain high-risk conditions should receive one or more doses of vaccine.  Hepatitis A vaccine. Adults who wish to be protected from this disease, have certain high-risk conditions, work with hepatitis A-infected animals, work in hepatitis A research labs, or   travel to or work in countries with a high rate of hepatitis A should be immunized. Adults who were previously unvaccinated and who anticipate close contact with an international adoptee during the first 60 days after arrival in the [place] from a country with a high rate of hepatitis A should be immunized.  Hepatitis B vaccine. Adults should be immunized if they wish to be protected from this disease, have certain high-risk conditions, may be exposed to blood or other infectious body fluids, are household contacts or sex partners of hepatitis B positive people, are clients or workers in certain care facilities, or travel to or work in countries with a high rate of hepatitis B.  Haemophilus influenzae type b (Hib) vaccine. A previously unvaccinated person with asplenia or sickle cell disease or having a scheduled splenectomy should receive 1 dose of Hib vaccine. Regardless of previous immunization, a recipient of a hematopoietic stem cell transplant should receive a 3-dose series 6-12 months after his successful transplant. Hib vaccine is not recommended for adults with HIV infection. Preventive Service / Frequency Ages 52 to 17  Blood pressure check.** / Every 1 to 2 years.  Lipid and cholesterol check.** / Every 5 years beginning at age 69.  Hepatitis C blood test.** / For any individual with known risks for  hepatitis C.  Skin self-exam. / Monthly.  Influenza vaccine. / Every year.  Tetanus, diphtheria, and acellular pertussis (Tdap, Td) vaccine.** / Consult your health care provider. 1 dose of Td every 10 years.  Varicella vaccine.** / Consult your health care provider.  HPV vaccine. / 3 doses over 6 months, if 72 or younger.  Measles, mumps, rubella (MMR) vaccine.** / You need at least 1 dose of MMR if you were born in 1957 or later. You may also need a second dose.  Pneumococcal 13-valent conjugate (PCV13) vaccine.** / Consult your health care provider.  Pneumococcal polysaccharide (PPSV23) vaccine.** / 1 to 2 doses if you smoke cigarettes or if you have certain conditions.  Meningococcal vaccine.** / 1 dose if you are age 35 to 60 years and a Market researcher living in a residence hall, or have one of several medical conditions. You may also need additional booster doses.  Hepatitis A vaccine.** / Consult your health care provider.  Hepatitis B vaccine.** / Consult your health care provider.  Haemophilus influenzae type b (Hib) vaccine.** / Consult your health care provider. Ages 35 to 8  Blood pressure check.** / Every 1 to 2 years.  Lipid and cholesterol check.** / Every 5 years beginning at age 57.  Lung cancer screening. / Every year if you are aged 44-80 years and have a 30-pack-year history of smoking and currently smoke or have quit within the past 15 years. Yearly screening is stopped once you have quit smoking for at least 15 years or develop a health problem that would prevent you from having lung cancer treatment.  Fecal occult blood test (FOBT) of stool. / Every year beginning at age 55 and continuing until age 73. You may not have to do this test if you get a colonoscopy every 10 years.  Flexible sigmoidoscopy** or colonoscopy.** / Every 5 years for a flexible sigmoidoscopy or every 10 years for a colonoscopy beginning at age 28 and continuing until age  1.  Hepatitis C blood test.** / For all people born from [birth year] through 1965 and any individual with known risks for hepatitis C.  Skin self-exam. / Monthly.  Influenza vaccine. / Every  year.  Tetanus, diphtheria, and acellular pertussis (Tdap/Td) vaccine.** / Consult your health care provider. 1 dose of Td every 10 years.  Varicella vaccine.** / Consult your health care provider.  Zoster vaccine.** / 1 dose for adults aged 53 years or older.  Measles, mumps, rubella (MMR) vaccine.** / You need at least 1 dose of MMR if you were born in 1957 or later. You may also need a second dose.  Pneumococcal 13-valent conjugate (PCV13) vaccine.** / Consult your health care provider.  Pneumococcal polysaccharide (PPSV23) vaccine.** / 1 to 2 doses if you smoke cigarettes or if you have certain conditions.  Meningococcal vaccine.** / Consult your health care provider.  Hepatitis A vaccine.** / Consult your health care provider.  Hepatitis B vaccine.** / Consult your health care provider.  Haemophilus influenzae type b (Hib) vaccine.** / Consult your health care provider. Ages 77 and over  Blood pressure check.** / Every 1 to 2 years.  Lipid and cholesterol check.**/ Every 5 years beginning at age 85.  Lung cancer screening. / Every year if you are aged 55-80 years and have a 30-pack-year history of smoking and currently smoke or have quit within the past 15 years. Yearly screening is stopped once you have quit smoking for at least 15 years or develop a health problem that would prevent you from having lung cancer treatment.  Fecal occult blood test (FOBT) of stool. / Every year beginning at age 33 and continuing until age 11. You may not have to do this test if you get a colonoscopy every 10 years.  Flexible sigmoidoscopy** or colonoscopy.** / Every 5 years for a flexible sigmoidoscopy or every 10 years for a colonoscopy beginning at age 28 and continuing until age 73.  Hepatitis C blood  test.** / For all people born from [birth year] through 1965 and any individual with known risks for hepatitis C.  Abdominal aortic aneurysm (AAA) screening.** / A one-time screening for ages 50 to 27 years who are current or former smokers.  Skin self-exam. / Monthly.  Influenza vaccine. / Every year.  Tetanus, diphtheria, and acellular pertussis (Tdap/Td) vaccine.** / 1 dose of Td every 10 years.  Varicella vaccine.** / Consult your health care provider.  Zoster vaccine.** / 1 dose for adults aged 34 years or older.  Pneumococcal 13-valent conjugate (PCV13) vaccine.** / Consult your health care provider.  Pneumococcal polysaccharide (PPSV23) vaccine.** / 1 dose for all adults aged 63 years and older.  Meningococcal vaccine.** / Consult your health care provider.  Hepatitis A vaccine.** / Consult your health care provider.  Hepatitis B vaccine.** / Consult your health care provider.  Haemophilus influenzae type b (Hib) vaccine.** / Consult your health care provider. **Family history and personal history of risk and conditions may change your health care provider's recommendations. Document Released: 08/23/2001 Document Revised: 07/02/2013 Document Reviewed: 11/22/2010 [facility] Patient Information 2015 [place], [place]. This information is not intended to replace advice given to you by your health care provider. Make sure you discuss any questions you have with your health care provider.

## 2014-09-05 NOTE — Assessment & Plan Note (Addendum)
Managing with some lifestyle modifications, uses other arm and does not have any radicular symptoms. He declines treatment or referral

## 2014-09-05 NOTE — Assessment & Plan Note (Addendum)
His CPAP machine took a tumble off of a shelf but appears to continue to function. Has not had a sleep study since 2008, offered a referral back to pulmonology, but without health insurance he declines referral he will let us know if he is ready to go. He will let us know if he wants a referral. He uses his cpap regularly

## 2014-09-05 NOTE — Assessment & Plan Note (Signed)
Tolerating statin, encouraged heart healthy diet, avoid trans fats, minimize simple carbs and saturated fats. Increase exercise as tolerated 

## 2014-09-05 NOTE — Assessment & Plan Note (Signed)
Worsening, Ibuprofen prn helps but he feels causes constipation, encouraged to consider Salon Pas gel

## 2014-09-08 ENCOUNTER — Encounter: Payer: Self-pay | Admitting: Family Medicine

## 2014-09-08 NOTE — Assessment & Plan Note (Signed)
Patient encouraged to maintain heart healthy diet, regular exercise, adequate sleep. Consider daily probiotics. Take medications as prescribed. Labs reviewed 

## 2014-09-09 ENCOUNTER — Other Ambulatory Visit: Payer: Self-pay | Admitting: Family Medicine

## 2014-09-10 NOTE — Telephone Encounter (Signed)
Last filled: 06/13/14 Amt: 90, 0 refills Last OV:  09/05/14  Med filled x 6 months.

## 2014-10-01 LAB — HM DIABETES EYE EXAM

## 2014-10-29 ENCOUNTER — Other Ambulatory Visit: Payer: Self-pay

## 2014-10-29 ENCOUNTER — Other Ambulatory Visit: Payer: Self-pay | Admitting: Family Medicine

## 2015-02-11 ENCOUNTER — Other Ambulatory Visit: Payer: Self-pay | Admitting: Family Medicine

## 2015-02-11 NOTE — Telephone Encounter (Signed)
Labs 09/03/14 LOV 09/05/2912  Medication refilled

## 2015-03-02 ENCOUNTER — Other Ambulatory Visit (INDEPENDENT_AMBULATORY_CARE_PROVIDER_SITE_OTHER): Payer: Self-pay

## 2015-03-02 DIAGNOSIS — G4733 Obstructive sleep apnea (adult) (pediatric): Secondary | ICD-10-CM

## 2015-03-02 DIAGNOSIS — E782 Mixed hyperlipidemia: Secondary | ICD-10-CM

## 2015-03-02 DIAGNOSIS — E669 Obesity, unspecified: Secondary | ICD-10-CM

## 2015-03-02 DIAGNOSIS — Z0000 Encounter for general adult medical examination without abnormal findings: Secondary | ICD-10-CM

## 2015-03-02 DIAGNOSIS — M25571 Pain in right ankle and joints of right foot: Secondary | ICD-10-CM

## 2015-03-02 DIAGNOSIS — I10 Essential (primary) hypertension: Secondary | ICD-10-CM

## 2015-03-02 DIAGNOSIS — T7840XD Allergy, unspecified, subsequent encounter: Secondary | ICD-10-CM

## 2015-03-02 DIAGNOSIS — E1169 Type 2 diabetes mellitus with other specified complication: Secondary | ICD-10-CM

## 2015-03-02 DIAGNOSIS — M25511 Pain in right shoulder: Secondary | ICD-10-CM

## 2015-03-02 DIAGNOSIS — E119 Type 2 diabetes mellitus without complications: Secondary | ICD-10-CM

## 2015-03-02 LAB — LIPID PANEL
Cholesterol: 101 mg/dL (ref 0–200)
HDL: 27.4 mg/dL — ABNORMAL LOW (ref >39.00)
LDL Cholesterol: 52 mg/dL (ref 0–99)
NonHDL: 73.6
Total CHOL/HDL Ratio: 4
Triglycerides: 109 mg/dL (ref 0.0–149.0)
VLDL: 21.8 mg/dL (ref 0.0–40.0)

## 2015-03-02 LAB — COMPREHENSIVE METABOLIC PANEL
ALT: 28 U/L (ref 0–53)
AST: 22 U/L (ref 0–37)
Albumin: 4.1 g/dL (ref 3.5–5.2)
Alkaline Phosphatase: 55 U/L (ref 39–117)
BUN: 14 mg/dL (ref 6–23)
CHLORIDE: 105 meq/L (ref 96–112)
CO2: 27 mEq/L (ref 19–32)
CREATININE: 1.06 mg/dL (ref 0.40–1.50)
Calcium: 9 mg/dL (ref 8.4–10.5)
GFR: 74.94 mL/min (ref >60.00)
Glucose, Bld: 158 mg/dL — ABNORMAL HIGH (ref 70–99)
Potassium: 4 mEq/L (ref 3.5–5.1)
SODIUM: 138 meq/L (ref 135–145)
TOTAL PROTEIN: 7.3 g/dL (ref 6.0–8.3)
Total Bilirubin: 0.7 mg/dL (ref 0.2–1.2)

## 2015-03-02 LAB — HEMOGLOBIN A1C: Hgb A1c MFr Bld: 6.5 % (ref 4.6–6.5)

## 2015-03-06 ENCOUNTER — Encounter: Payer: Self-pay | Admitting: Family Medicine

## 2015-03-06 ENCOUNTER — Ambulatory Visit (INDEPENDENT_AMBULATORY_CARE_PROVIDER_SITE_OTHER): Payer: Self-pay | Admitting: Family Medicine

## 2015-03-06 VITALS — BP 146/78 | HR 88 | Temp 98.2°F | Ht 71.0 in | Wt 216.2 lb

## 2015-03-06 DIAGNOSIS — E669 Obesity, unspecified: Secondary | ICD-10-CM

## 2015-03-06 DIAGNOSIS — Z0001 Encounter for general adult medical examination with abnormal findings: Secondary | ICD-10-CM

## 2015-03-06 DIAGNOSIS — E1169 Type 2 diabetes mellitus with other specified complication: Secondary | ICD-10-CM

## 2015-03-06 DIAGNOSIS — E119 Type 2 diabetes mellitus without complications: Secondary | ICD-10-CM

## 2015-03-06 DIAGNOSIS — I10 Essential (primary) hypertension: Secondary | ICD-10-CM

## 2015-03-06 DIAGNOSIS — M1990 Unspecified osteoarthritis, unspecified site: Secondary | ICD-10-CM | POA: Insufficient documentation

## 2015-03-06 DIAGNOSIS — E782 Mixed hyperlipidemia: Secondary | ICD-10-CM

## 2015-03-06 DIAGNOSIS — K219 Gastro-esophageal reflux disease without esophagitis: Secondary | ICD-10-CM | POA: Insufficient documentation

## 2015-03-06 DIAGNOSIS — R6889 Other general symptoms and signs: Secondary | ICD-10-CM

## 2015-03-06 HISTORY — DX: Unspecified osteoarthritis, unspecified site: M19.90

## 2015-03-06 HISTORY — DX: Gastro-esophageal reflux disease without esophagitis: K21.9

## 2015-03-06 MED ORDER — PNEUMOCOCCAL 13-VAL CONJ VACC IM SUSP: 0.5000 mL | Freq: Once | INTRAMUSCULAR | Status: DC

## 2015-03-06 MED ORDER — ZOSTER VACCINE LIVE 19400 UNT/0.65ML ~~LOC~~ SOLR: 0.6500 mL | Freq: Once | SUBCUTANEOUS | Status: DC

## 2015-03-06 NOTE — Assessment & Plan Note (Signed)
hgba1c acceptable, minimize simple carbs. Increase exercise as tolerated. Continue current meds 

## 2015-03-06 NOTE — Assessment & Plan Note (Signed)
No gout symptoms such as redness, warmth

## 2015-03-06 NOTE — Assessment & Plan Note (Signed)
Tolerating statin, encouraged heart healthy diet, avoid trans fats, minimize simple carbs and saturated fats. Increase exercise as tolerated 

## 2015-03-06 NOTE — Assessment & Plan Note (Signed)
Improved on recheck. Well controlled, no changes to meds. Encouraged heart healthy diet such as the DASH diet and exercise as tolerated.  

## 2015-03-06 NOTE — Progress Notes (Signed)
Pre visit review using our clinic review tool, if applicable. No additional management support is needed unless otherwise documented below in the visit note. 

## 2015-03-06 NOTE — Progress Notes (Signed)
Patient ID: Evan Best, male   DOB: 26-Mar-1951, 64 y.o.   MRN: [id number]   Subjective:    Patient ID: Evan Best, male    DOB: 1950/10/14, 64 y.o.   MRN: [id number]  Chief Complaint  Patient presents with  . Follow-up    HPI  Duplicate note Past Medical History  Diagnosis Date  . OSA (obstructive sleep apnea)   . Hypertension   . Migraine headache     remote history  . Diabetes mellitus type II 1/09  . Gout 09/18/2012    Left big toe  . Allergic state 09/22/2013  . Preventative health care 09/05/2014  . Right shoulder pain 02/19/2009    Qualifier: Diagnosis of  By: Wynona Luna     Past Surgical History  Procedure Laterality Date  . Tonsillectomy  1959  . Hernia repair  9678    umbilical hernia repair  . Knee surgery      left knee surgery  . Vasectomy      Family History  Problem Relation Age of Onset  . Breast cancer Mother   . Hypertension Mother   . Hyperlipidemia Father   . Kidney disease Father   . Heart disease Father   . Hypertension Father   . Colon cancer Neg Hx   . Stomach cancer Neg Hx     Social History   Social History  . Marital Status: Married    Spouse Name: N/A  . Number of Children: N/A  . Years of Education: N/A   Occupational History  . Not on file.   Social History Main Topics  . Smoking status: Never Smoker   . Smokeless tobacco: Never Used  . Alcohol Use: No  . Drug Use: No  . Sexual Activity: Not on file   Other Topics Concern  . Not on file   Social History Narrative   Occupation:  Chemical engineer   Married  -  1 daughter (age 4)    Never Smoked   Alcohol use-no     Drug use-no           Outpatient Prescriptions Prior to Visit  Medication Sig Dispense Refill  . amLODipine (NORVASC) 10 MG tablet TAKE ONE TABLET BY MOUTH ONCE DAILY 90 tablet 1  . Calcium-Magnesium-Zinc 1000-400-15 MG TABS Take by mouth daily.      . Chelated Potassium 99 MG TABS Take 99 mg by mouth 2 (two) times daily.     .  Cinnamon 500 MG capsule Take 500 mg by mouth 2 (two) times daily. +50 mg Chromium in each supplement.    . Coenzyme Q10 (CO Q-10) 100 MG CAPS Take 100 mg by mouth daily.      . Cyanocobalamin (VITAMIN B 12) 250 MCG LOZG Take 250 mcg by mouth 2 (two) times a week.      . Glucosamine-Chondroit-Vit C-Mn (GLUCOSAMINE CHONDR 1500 COMPLX) CAPS Take by mouth daily. 1500 mg Glucosamine + 1200 mg Chondroitin QD.    Marland Kitchen glucose blood (ONE TOUCH TEST STRIPS) test strip Use as instructed to check blood sugar up to three times per day     . lisinopril (PRINIVIL,ZESTRIL) 40 MG tablet TAKE ONE TABLET BY MOUTH ONCE DAILY 90 tablet 1  . metFORMIN (GLUCOPHAGE) 500 MG tablet TAKE ONE TABLET BY MOUTH ONCE DAILY WITH  BREAKFAST 90 tablet 1  . Omega-3 Fatty Acids (FISH OIL PO) Take 1,500 mg by mouth daily.      . ONE TOUCH LANCETS  MISC Use as instructed to check blood sugar for up to three times per day     . OVER THE COUNTER MEDICATION Phosphatidyl choline- 1 capsule bid    . simvastatin (ZOCOR) 10 MG tablet TAKE ONE TABLET BY MOUTH AT BEDTIME 90 tablet 2  . vitamin C (ASCORBIC ACID) 500 MG tablet Take 500 mg by mouth 2 (two) times daily.      . fluticasone (FLONASE) 50 MCG/ACT nasal spray Place 2 sprays into both nostrils daily. (Patient not taking: Reported on 03/06/2015) 16 g 6   No facility-administered medications prior to visit.    No Known Allergies  Review of Systems  Constitutional: Negative for fever and malaise/fatigue.  HENT: Negative for congestion.   Eyes: Negative for discharge.  Respiratory: Negative for shortness of breath.   Cardiovascular: Negative for chest pain, palpitations and leg swelling.  Gastrointestinal: Negative for nausea and abdominal pain.  Genitourinary: Negative for dysuria.  Musculoskeletal: Negative for falls.  Skin: Negative for rash.  Neurological: Negative for loss of consciousness and headaches.  Endo/Heme/Allergies: Negative for environmental allergies.    Psychiatric/Behavioral: Negative for depression. The patient is not nervous/anxious.        Objective:    Physical Exam  BP 160/82 mmHg  Pulse 88  Temp(Src) 98.2 F (36.8 C) (Oral)  Ht 5\' 11"  (1.803 m)  Wt 216 lb 4 oz (98.09 kg)  BMI 30.17 kg/m2  SpO2 94% Wt Readings from Last 3 Encounters:  03/06/15 216 lb 4 oz (98.09 kg)  09/05/14 214 lb 6 oz (97.24 kg)  04/01/14 219 lb 3.2 oz (99.428 kg)     Assessment & Plan:   Problem List Items Addressed This Visit    None

## 2015-03-06 NOTE — Assessment & Plan Note (Signed)
Struggling with increased congestion. This is disrupting his sleep some, is encouraged to continue his use of Cetirizine, Flonase and increase nasal saline use.

## 2015-03-06 NOTE — Assessment & Plan Note (Signed)
Avoid offending foods, change probiotics. Do not eat large meals in late evening and consider raising head of bed.

## 2015-03-06 NOTE — Patient Instructions (Addendum)
Probiotics daily consider [place] makes a 10 strain version at [email or web address] Hypertension Hypertension, commonly called high blood pressure, is when the force of blood pumping through your arteries is too strong. Your arteries are the blood vessels that carry blood from your heart throughout your body. A blood pressure reading consists of a higher number over a lower number, such as 110/72. The higher number (systolic) is the pressure inside your arteries when your heart pumps. The lower number (diastolic) is the pressure inside your arteries when your heart relaxes. Ideally you want your blood pressure below 120/80. Hypertension forces your heart to work harder to pump blood. Your arteries may become narrow or stiff. Having hypertension puts you at risk for heart disease, stroke, and other problems.  RISK FACTORS Some risk factors for high blood pressure are controllable. Others are not.  Risk factors you cannot control include:   Race. You may be at higher risk if you are African American.  Age. Risk increases with age.  Gender. Men are at higher risk than women before age 53 years. After age 67, women are at higher risk than men. Risk factors you can control include:  Not getting enough exercise or physical activity.  Being overweight.  Getting too much fat, sugar, calories, or salt in your diet.  Drinking too much alcohol. SIGNS AND SYMPTOMS Hypertension does not usually cause signs or symptoms. Extremely high blood pressure (hypertensive crisis) may cause headache, anxiety, shortness of breath, and nosebleed. DIAGNOSIS  To check if you have hypertension, your health care provider will measure your blood pressure while you are seated, with your arm held at the level of your heart. It should be measured at least twice using the same arm. Certain conditions can cause a difference in blood pressure between your right and left arms. A blood pressure reading that is higher than normal  on one occasion does not mean that you need treatment. If one blood pressure reading is high, ask your health care provider about having it checked again. TREATMENT  Treating high blood pressure includes making lifestyle changes and possibly taking medicine. Living a healthy lifestyle can help lower high blood pressure. You may need to change some of your habits. Lifestyle changes may include:  Following the DASH diet. This diet is high in fruits, vegetables, and whole grains. It is low in salt, red meat, and added sugars.  Getting at least 2 hours of brisk physical activity every week.  Losing weight if necessary.  Not smoking.  Limiting alcoholic beverages.  Learning ways to reduce stress. If lifestyle changes are not enough to get your blood pressure under control, your health care provider may prescribe medicine. You may need to take more than one. Work closely with your health care provider to understand the risks and benefits. HOME CARE INSTRUCTIONS  Have your blood pressure rechecked as directed by your health care provider.   Take medicines only as directed by your health care provider. Follow the directions carefully. Blood pressure medicines must be taken as prescribed. The medicine does not work as well when you skip doses. Skipping doses also puts you at risk for problems.   Do not smoke.   Monitor your blood pressure at home as directed by your health care provider. SEEK MEDICAL CARE IF:   You think you are having a reaction to medicines taken.  You have recurrent headaches or feel dizzy.  You have swelling in your ankles.  You have trouble with your vision. SEEK  IMMEDIATE MEDICAL CARE IF:  You develop a severe headache or confusion.  You have unusual weakness, numbness, or feel faint.  You have severe chest or abdominal pain.  You vomit repeatedly.  You have trouble breathing. MAKE SURE YOU:   Understand these instructions.  Will watch your  condition.  Will get help right away if you are not doing well or get worse. Document Released: 06/27/2005 Document Revised: 11/11/2013 Document Reviewed: 04/19/2013 [facility] Patient Information 2015 [place], [place]. This information is not intended to replace advice given to you by your health care provider. Make sure you discuss any questions you have with your health care provider. Food Choices for Gastroesophageal Reflux Disease When you have gastroesophageal reflux disease (GERD), the foods you eat and your eating habits are very important. Choosing the right foods can help ease your discomfort.  WHAT GUIDELINES DO I NEED TO FOLLOW?   Choose fruits, vegetables, whole grains, and low-fat dairy products.   Choose low-fat meat, fish, and poultry.  Limit fats such as oils, salad dressings, butter, nuts, and avocado.   Keep a food diary. This helps you identify foods that cause symptoms.   Avoid foods that cause symptoms. These may be different for everyone.   Eat small meals often instead of 3 large meals a day.   Eat your meals slowly, in a place where you are relaxed.   Limit fried foods.   Cook foods using methods other than frying.   Avoid drinking alcohol.   Avoid drinking large amounts of liquids with your meals.   Avoid bending over or lying down until 2-3 hours after eating.  WHAT FOODS ARE NOT RECOMMENDED?  These are some foods and drinks that may make your symptoms worse: Vegetables Tomatoes. Tomato juice. Tomato and spaghetti sauce. Chili peppers. Onion and garlic. Horseradish. Fruits Oranges, grapefruit, and lemon (fruit and juice). Meats High-fat meats, fish, and poultry. This includes hot dogs, ribs, ham, sausage, salami, and bacon. Dairy Whole milk and chocolate milk. Sour cream. Cream. Butter. Ice cream. Cream cheese.  Drinks Coffee and tea. Bubbly (carbonated) drinks or energy drinks. Condiments Hot sauce. Barbecue sauce.   Sweets/Desserts Chocolate and cocoa. Donuts. Peppermint and spearmint. Fats and Oils High-fat foods. This includes [place] fries and potato chips. Other Vinegar. Strong spices. This includes black pepper, white pepper, red pepper, cayenne, curry powder, cloves, ginger, and chili powder. The items listed above may not be a complete list of foods and drinks to avoid. Contact your dietitian for more information. Document Released: 12/27/2011 Document Revised: 07/02/2013 Document Reviewed: 05/01/2013 [facility] Patient Information 2015 [place], [place]. This information is not intended to replace advice given to you by your health care provider. Make sure you discuss any questions you have with your health care provider.

## 2015-03-06 NOTE — Assessment & Plan Note (Signed)
Uses CPAP fairly consistently reports 5 1/2 nights out of 7 use.

## 2015-03-06 NOTE — Progress Notes (Signed)
Subjective:    Patient ID: Evan Best, male    DOB: 10/04/1950, 64 y.o.   MRN: [id number]  Chief Complaint  Patient presents with  . Follow-up    HPI Patient is in today for follow up on numerous concerns. Reports BP been running in 761P to 509T systolic over 26Z and 12W diastolic, has had some trouble with congestion and allergies. Lately. Has been using Flonase and nasal saline prn with some results. Has been using CPAP at night. No recent illness but acknowledges poor sleep. Denies CP/palp/SOB/HA/congestion/fevers/GI or GU c/o. Taking meds as prescribed  Past Medical History  Diagnosis Date  . OSA (obstructive sleep apnea)   . Hypertension   . Migraine headache     remote history  . Diabetes mellitus type II 1/09  . Gout 09/18/2012    Left big toe  . Allergic state 09/22/2013  . Preventative health care 09/05/2014  . Right shoulder pain 02/19/2009    Qualifier: Diagnosis of  By: Wynona Luna     Past Surgical History  Procedure Laterality Date  . Tonsillectomy  1959  . Hernia repair  5809    umbilical hernia repair  . Knee surgery      left knee surgery  . Vasectomy      Family History  Problem Relation Age of Onset  . Breast cancer Mother   . Hypertension Mother   . Hyperlipidemia Father   . Kidney disease Father   . Heart disease Father   . Hypertension Father   . Colon cancer Neg Hx   . Stomach cancer Neg Hx     Social History   Social History  . Marital Status: Married    Spouse Name: N/A  . Number of Children: N/A  . Years of Education: N/A   Occupational History  . Not on file.   Social History Main Topics  . Smoking status: Never Smoker   . Smokeless tobacco: Never Used  . Alcohol Use: No  . Drug Use: No  . Sexual Activity: Not on file   Other Topics Concern  . Not on file   Social History Narrative   Occupation:  Chemical engineer   Married  -  1 daughter (age 69)    Never Smoked   Alcohol use-no     Drug use-no         Outpatient Prescriptions Prior to Visit  Medication Sig Dispense Refill  . amLODipine (NORVASC) 10 MG tablet TAKE ONE TABLET BY MOUTH ONCE DAILY 90 tablet 1  . Calcium-Magnesium-Zinc 1000-400-15 MG TABS Take by mouth daily.      . Chelated Potassium 99 MG TABS Take 99 mg by mouth 2 (two) times daily.     . Cinnamon 500 MG capsule Take 500 mg by mouth 2 (two) times daily. +50 mg Chromium in each supplement.    . Coenzyme Q10 (CO Q-10) 100 MG CAPS Take 100 mg by mouth daily.      . Cyanocobalamin (VITAMIN B 12) 250 MCG LOZG Take 250 mcg by mouth 2 (two) times a week.      . Glucosamine-Chondroit-Vit C-Mn (GLUCOSAMINE CHONDR 1500 COMPLX) CAPS Take by mouth daily. 1500 mg Glucosamine + 1200 mg Chondroitin QD.    Marland Kitchen glucose blood (ONE TOUCH TEST STRIPS) test strip Use as instructed to check blood sugar up to three times per day     . lisinopril (PRINIVIL,ZESTRIL) 40 MG tablet TAKE ONE TABLET BY MOUTH ONCE DAILY 90 tablet  1  . metFORMIN (GLUCOPHAGE) 500 MG tablet TAKE ONE TABLET BY MOUTH ONCE DAILY WITH  BREAKFAST 90 tablet 1  . Omega-3 Fatty Acids (FISH OIL PO) Take 1,500 mg by mouth daily.      . ONE TOUCH LANCETS MISC Use as instructed to check blood sugar for up to three times per day     . OVER THE COUNTER MEDICATION Phosphatidyl choline- 1 capsule bid    . simvastatin (ZOCOR) 10 MG tablet TAKE ONE TABLET BY MOUTH AT BEDTIME 90 tablet 2  . vitamin C (ASCORBIC ACID) 500 MG tablet Take 500 mg by mouth 2 (two) times daily.      . fluticasone (FLONASE) 50 MCG/ACT nasal spray Place 2 sprays into both nostrils daily. (Patient not taking: Reported on 03/06/2015) 16 g 6   No facility-administered medications prior to visit.    No Known Allergies  Review of Systems  Constitutional: Negative for fever and malaise/fatigue.  HENT: Negative for congestion.   Eyes: Negative for discharge.  Respiratory: Negative for shortness of breath.   Cardiovascular: Negative for chest pain, palpitations and leg  swelling.  Gastrointestinal: Negative for nausea and abdominal pain.  Genitourinary: Negative for dysuria.  Musculoskeletal: Negative for falls.  Skin: Negative for rash.  Neurological: Negative for loss of consciousness and headaches.  Endo/Heme/Allergies: Negative for environmental allergies.  Psychiatric/Behavioral: Negative for depression. The patient is not nervous/anxious.        Objective:    Physical Exam  Constitutional: He is oriented to person, place, and time. He appears well-developed and well-nourished. No distress.  HENT:  Head: Normocephalic and atraumatic.  Nose: Nose normal.  Eyes: Right eye exhibits no discharge. Left eye exhibits no discharge.  Neck: Normal range of motion. Neck supple.  Cardiovascular: Normal rate and regular rhythm.   No murmur heard. Pulmonary/Chest: Effort normal and breath sounds normal.  Abdominal: Soft. Bowel sounds are normal. There is no tenderness.  Musculoskeletal: He exhibits no edema.  Neurological: He is alert and oriented to person, place, and time.  Skin: Skin is warm and dry.  Psychiatric: He has a normal mood and affect.  Nursing note and vitals reviewed.   BP 160/82 mmHg  Pulse 88  Temp(Src) 98.2 F (36.8 C) (Oral)  Ht 5\' 11"  (1.803 m)  Wt 216 lb 4 oz (98.09 kg)  BMI 30.17 kg/m2  SpO2 94% Wt Readings from Last 3 Encounters:  03/06/15 216 lb 4 oz (98.09 kg)  09/05/14 214 lb 6 oz (97.24 kg)  04/01/14 219 lb 3.2 oz (99.428 kg)     Lab Results  Component Value Date   WBC 7.3 09/01/2014   HGB 14.8 09/01/2014   HCT 42.2 09/01/2014   PLT 264.0 09/01/2014   GLUCOSE 158* 03/02/2015   CHOL 101 03/02/2015   TRIG 109.0 03/02/2015   HDL 27.40* 03/02/2015   LDLCALC 52 03/02/2015   ALT 28 03/02/2015   AST 22 03/02/2015   NA 138 03/02/2015   K 4.0 03/02/2015   CL 105 03/02/2015   CREATININE 1.06 03/02/2015   BUN 14 03/02/2015   CO2 27 03/02/2015   TSH 2.41 09/01/2014   PSA 0.53 06/17/2013   HGBA1C 6.5  03/02/2015   MICROALBUR 1.66 03/15/2013    Lab Results  Component Value Date   TSH 2.41 09/01/2014   Lab Results  Component Value Date   WBC 7.3 09/01/2014   HGB 14.8 09/01/2014   HCT 42.2 09/01/2014   MCV 81.7 09/01/2014   PLT 264.0  09/01/2014   Lab Results  Component Value Date   NA 138 03/02/2015   K 4.0 03/02/2015   CO2 27 03/02/2015   GLUCOSE 158* 03/02/2015   BUN 14 03/02/2015   CREATININE 1.06 03/02/2015   BILITOT 0.7 03/02/2015   ALKPHOS 55 03/02/2015   AST 22 03/02/2015   ALT 28 03/02/2015   PROT 7.3 03/02/2015   ALBUMIN 4.1 03/02/2015   CALCIUM 9.0 03/02/2015   GFR 74.94 03/02/2015   Lab Results  Component Value Date   CHOL 101 03/02/2015   Lab Results  Component Value Date   HDL 27.40* 03/02/2015   Lab Results  Component Value Date   LDLCALC 52 03/02/2015   Lab Results  Component Value Date   TRIG 109.0 03/02/2015   Lab Results  Component Value Date   CHOLHDL 4 03/02/2015   Lab Results  Component Value Date   HGBA1C 6.5 03/02/2015       Assessment & Plan:   Diabetes mellitus type 2 in obese hgba1c acceptable, minimize simple carbs. Increase exercise as tolerated. Continue current meds  Obstructive sleep apnea Uses CPAP fairly consistently reports 5 1/2 nights out of 7 use.   Hyperlipidemia, mixed Tolerating statin, encouraged heart healthy diet, avoid trans fats, minimize simple carbs and saturated fats. Increase exercise as tolerated  Allergic state Struggling with increased congestion. This is disrupting his sleep some, is encouraged to continue his use of Cetirizine, Flonase and increase nasal saline use.   Essential hypertension Improved on recheck. Well controlled, no changes to meds. Encouraged heart healthy diet such as the DASH diet and exercise as tolerated.   Gout No gout symptoms such as redness, warmth  Esophageal reflux Avoid offending foods, change probiotics. Do not eat large meals in late evening and consider  raising head of bed.     I am having Mr. Wuthrich maintain his Vitamin B 12, Calcium-Magnesium-Zinc, Chelated Potassium, Cinnamon, Omega-3 Fatty Acids (FISH OIL PO), glucose blood, ONE TOUCH LANCETS, vitamin C, Co Q-10, GLUCOSAMINE CHONDR 1500 COMPLX, fluticasone, OVER THE COUNTER MEDICATION, simvastatin, metFORMIN, amLODipine, lisinopril, cetirizine, and Quercetin.  Meds ordered this encounter  Medications  . cetirizine (ZYRTEC) 10 MG tablet    Sig: Take 10 mg by mouth daily.  . Quercetin 250 MG TABS    Sig: Take by mouth daily.     Penni Homans, MD

## 2015-03-09 ENCOUNTER — Other Ambulatory Visit: Payer: Self-pay | Admitting: Family Medicine

## 2015-04-27 ENCOUNTER — Other Ambulatory Visit: Payer: Self-pay | Admitting: Family Medicine

## 2015-05-12 ENCOUNTER — Other Ambulatory Visit: Payer: Self-pay | Admitting: Family Medicine

## 2015-05-25 ENCOUNTER — Other Ambulatory Visit: Payer: Self-pay | Admitting: Family Medicine

## 2015-06-08 ENCOUNTER — Other Ambulatory Visit: Payer: Self-pay | Admitting: Family Medicine

## 2015-06-19 ENCOUNTER — Telehealth: Payer: Self-pay | Admitting: Family Medicine

## 2015-06-19 NOTE — Telephone Encounter (Signed)
Documented under health maintenance/immunizations.

## 2015-06-19 NOTE — Telephone Encounter (Signed)
Patient had Flu Shot @ [facility] on October 7th

## 2015-08-05 ENCOUNTER — Other Ambulatory Visit: Payer: Self-pay | Admitting: Family Medicine

## 2015-09-01 ENCOUNTER — Other Ambulatory Visit (INDEPENDENT_AMBULATORY_CARE_PROVIDER_SITE_OTHER): Payer: Self-pay

## 2015-09-01 DIAGNOSIS — E669 Obesity, unspecified: Secondary | ICD-10-CM

## 2015-09-01 DIAGNOSIS — E119 Type 2 diabetes mellitus without complications: Secondary | ICD-10-CM

## 2015-09-01 DIAGNOSIS — I10 Essential (primary) hypertension: Secondary | ICD-10-CM

## 2015-09-01 DIAGNOSIS — E782 Mixed hyperlipidemia: Secondary | ICD-10-CM

## 2015-09-01 DIAGNOSIS — Z0001 Encounter for general adult medical examination with abnormal findings: Secondary | ICD-10-CM

## 2015-09-01 DIAGNOSIS — E1169 Type 2 diabetes mellitus with other specified complication: Secondary | ICD-10-CM

## 2015-09-01 LAB — LIPID PANEL
CHOLESTEROL: 117 mg/dL (ref 0–200)
HDL: 29.1 mg/dL — AB (ref >39.00)
LDL Cholesterol: 70 mg/dL (ref 0–99)
NonHDL: 87.81
TRIGLYCERIDES: 87 mg/dL (ref 0.0–149.0)
Total CHOL/HDL Ratio: 4
VLDL: 17.4 mg/dL (ref 0.0–40.0)

## 2015-09-01 LAB — COMPREHENSIVE METABOLIC PANEL
ALBUMIN: 4.2 g/dL (ref 3.5–5.2)
ALK PHOS: 46 U/L (ref 39–117)
ALT: 22 U/L (ref 0–53)
AST: 18 U/L (ref 0–37)
BILIRUBIN TOTAL: 0.5 mg/dL (ref 0.2–1.2)
BUN: 18 mg/dL (ref 6–23)
CALCIUM: 9 mg/dL (ref 8.4–10.5)
CO2: 29 mEq/L (ref 19–32)
CREATININE: 1.26 mg/dL (ref 0.40–1.50)
Chloride: 104 mEq/L (ref 96–112)
GFR: 61.29 mL/min (ref >60.00)
Glucose, Bld: 136 mg/dL — ABNORMAL HIGH (ref 70–99)
Potassium: 3.9 mEq/L (ref 3.5–5.1)
Sodium: 138 mEq/L (ref 135–145)
Total Protein: 7.2 g/dL (ref 6.0–8.3)

## 2015-09-01 LAB — CBC
HCT: 38.4 % — ABNORMAL LOW (ref 39.0–52.0)
HEMOGLOBIN: 13 g/dL (ref 13.0–17.0)
MCHC: 33.8 g/dL (ref 30.0–36.0)
MCV: 78.6 fl (ref 78.0–100.0)
PLATELETS: 352 10*3/uL (ref 150.0–400.0)
RBC: 4.88 Mil/uL (ref 4.22–5.81)
RDW: 13.9 % (ref 11.5–15.5)
WBC: 7.6 10*3/uL (ref 4.0–10.5)

## 2015-09-01 LAB — TSH: TSH: 1.89 u[IU]/mL (ref 0.35–4.50)

## 2015-09-01 LAB — HEMOGLOBIN A1C: Hgb A1c MFr Bld: 6.9 % — ABNORMAL HIGH (ref 4.6–6.5)

## 2015-09-02 LAB — HEPATITIS C ANTIBODY: HCV Ab: NEGATIVE

## 2015-09-07 ENCOUNTER — Ambulatory Visit (INDEPENDENT_AMBULATORY_CARE_PROVIDER_SITE_OTHER): Payer: Self-pay | Admitting: Family Medicine

## 2015-09-07 ENCOUNTER — Encounter: Payer: Self-pay | Admitting: Family Medicine

## 2015-09-07 VITALS — BP 140/78 | HR 86 | Temp 97.7°F | Ht 71.0 in | Wt 213.4 lb

## 2015-09-07 DIAGNOSIS — K219 Gastro-esophageal reflux disease without esophagitis: Secondary | ICD-10-CM

## 2015-09-07 DIAGNOSIS — Z0000 Encounter for general adult medical examination without abnormal findings: Secondary | ICD-10-CM

## 2015-09-07 DIAGNOSIS — G4700 Insomnia, unspecified: Secondary | ICD-10-CM

## 2015-09-07 DIAGNOSIS — I10 Essential (primary) hypertension: Secondary | ICD-10-CM

## 2015-09-07 DIAGNOSIS — E782 Mixed hyperlipidemia: Secondary | ICD-10-CM

## 2015-09-07 DIAGNOSIS — G4733 Obstructive sleep apnea (adult) (pediatric): Secondary | ICD-10-CM

## 2015-09-07 DIAGNOSIS — D649 Anemia, unspecified: Secondary | ICD-10-CM | POA: Insufficient documentation

## 2015-09-07 DIAGNOSIS — M1990 Unspecified osteoarthritis, unspecified site: Secondary | ICD-10-CM

## 2015-09-07 DIAGNOSIS — R251 Tremor, unspecified: Secondary | ICD-10-CM | POA: Insufficient documentation

## 2015-09-07 DIAGNOSIS — E1169 Type 2 diabetes mellitus with other specified complication: Secondary | ICD-10-CM

## 2015-09-07 DIAGNOSIS — E669 Obesity, unspecified: Secondary | ICD-10-CM

## 2015-09-07 DIAGNOSIS — G2581 Restless legs syndrome: Secondary | ICD-10-CM

## 2015-09-07 HISTORY — DX: Tremor, unspecified: R25.1

## 2015-09-07 HISTORY — DX: Restless legs syndrome: G25.81

## 2015-09-07 HISTORY — DX: Anemia, unspecified: D64.9

## 2015-09-07 NOTE — Assessment & Plan Note (Signed)
Patient encouraged to maintain heart healthy diet, regular exercise, adequate sleep. Consider daily probiotics. Take medications as prescribed 

## 2015-09-07 NOTE — Assessment & Plan Note (Signed)
Well controlled, no changes to meds. Encouraged heart healthy diet such as the DASH diet and exercise as tolerated.  °

## 2015-09-07 NOTE — Assessment & Plan Note (Signed)
Avoid offending foods, start probiotics. Do not eat large meals in late evening and consider raising head of bed.  

## 2015-09-07 NOTE — Assessment & Plan Note (Signed)
Mild. Increase leafy greens, consider increased lean red meat and using cast iron cookware. Continue to monitor, report any concerns. May be contributing to RLS

## 2015-09-07 NOTE — Progress Notes (Signed)
Pre visit review using our clinic review tool, if applicable. No additional management support is needed unless otherwise documented below in the visit note. 

## 2015-09-07 NOTE — Assessment & Plan Note (Signed)
Encouraged increased hydration and increase iron if no improvement encouraged increased iron in diet. Will consider meds at that time

## 2015-09-07 NOTE — Progress Notes (Signed)
Patient ID: Evan Best, male   DOB: 06-21-1951, 65 y.o.   MRN: [id number]   Subjective:    Patient ID: Evan Best, male    DOB: December 29, 1950, 65 y.o.   MRN: [id number]  Chief Complaint  Patient presents with  . Annual Exam    HPI Patient is in today for annual exam and follow up on numerous concerns. Is struggling with insomnia. Has trouble falling asleep and staying asleep. Has also noted a stiff neck from time to time. No recent illness or other acute complaints. Denies CP/palp/SOB/HA/congestion/fevers/GI or GU c/o. Taking meds as prescribed  Past Medical History  Diagnosis Date  . OSA (obstructive sleep apnea)   . Hypertension   . Migraine headache     remote history  . Diabetes mellitus type II 1/09  . Gout 09/18/2012    Left big toe  . Allergic state 09/22/2013  . Preventative health care 09/05/2014  . Right shoulder pain 02/19/2009    Qualifier: Diagnosis of  By: Wynona Luna   . Arthritis 03/06/2015  . Esophageal reflux 03/06/2015  . Anemia 09/07/2015  . Tremor 09/07/2015  . RLS (restless legs syndrome) 09/07/2015  . Insomnia 09/13/2015    Past Surgical History  Procedure Laterality Date  . Tonsillectomy  1959  . Hernia repair  [id number]    umbilical hernia repair  . Knee surgery      left knee surgery  . Vasectomy      Family History  Problem Relation Age of Onset  . Breast cancer Mother   . Hypertension Mother   . Hyperlipidemia Father   . Kidney disease Father   . Heart disease Father   . Hypertension Father   . Colon cancer Neg Hx   . Stomach cancer Neg Hx     Social History   Social History  . Marital Status: Married    Spouse Name: N/A  . Number of Children: N/A  . Years of Education: N/A   Occupational History  . Not on file.   Social History Main Topics  . Smoking status: Never Smoker   . Smokeless tobacco: Never Used  . Alcohol Use: No  . Drug Use: No  . Sexual Activity: Not on file   Other Topics Concern  . Not on file    Social History Narrative   Occupation:  Chemical engineer   Married  -  1 daughter (age 25)    Never Smoked   Alcohol use-no     Drug use-no           Outpatient Prescriptions Prior to Visit  Medication Sig Dispense Refill  . amLODipine (NORVASC) 10 MG tablet TAKE ONE TABLET BY MOUTH ONCE DAILY 90 tablet 0  . Calcium-Magnesium-Zinc 1000-400-15 MG TABS Take by mouth daily.      . cetirizine (ZYRTEC) 10 MG tablet Take 10 mg by mouth daily.    . Chelated Potassium 99 MG TABS Take 99 mg by mouth 2 (two) times daily.     . Cinnamon 500 MG capsule Take 500 mg by mouth 2 (two) times daily. +50 mg Chromium in each supplement.    . Coenzyme Q10 (CO Q-10) 100 MG CAPS Take 100 mg by mouth daily.      . Cyanocobalamin (VITAMIN B 12) 250 MCG LOZG Take 250 mcg by mouth 2 (two) times a week.      . fluticasone (FLONASE) 50 MCG/ACT nasal spray Place 2 sprays into both nostrils daily. 16 g  6  . Glucosamine-Chondroit-Vit C-Mn (GLUCOSAMINE CHONDR 1500 COMPLX) CAPS Take by mouth daily. 1500 mg Glucosamine + 1200 mg Chondroitin QD.    Marland Kitchen glucose blood (ONE TOUCH TEST STRIPS) test strip Use as instructed to check blood sugar up to three times per day     . lisinopril (PRINIVIL,ZESTRIL) 40 MG tablet TAKE ONE TABLET BY MOUTH ONCE DAILY 90 tablet 0  . metFORMIN (GLUCOPHAGE) 500 MG tablet TAKE ONE TABLET BY MOUTH ONCE DAILY WITH BREAKFAST 90 tablet 1  . Omega-3 Fatty Acids (FISH OIL PO) Take 1,500 mg by mouth daily.      . ONE TOUCH LANCETS MISC Use as instructed to check blood sugar for up to three times per day     . OVER THE COUNTER MEDICATION Phosphatidyl choline- 1 capsule bid    . pneumococcal 13-valent conjugate vaccine (PREVNAR 13) SUSP injection Inject 0.5 mLs into the muscle once. 1 Syringe 0  . Quercetin 250 MG TABS Take by mouth daily.    . simvastatin (ZOCOR) 10 MG tablet TAKE ONE TABLET BY MOUTH AT BEDTIME 90 tablet 1  . vitamin C (ASCORBIC ACID) 500 MG tablet Take 500 mg by mouth 2 (two)  times daily.      Marland Kitchen zoster vaccine live, PF, (ZOSTAVAX) 57846 UNT/0.65ML injection Inject 19,400 Units into the skin once. 1 each 0   No facility-administered medications prior to visit.    No Known Allergies  Review of Systems  Constitutional: Positive for malaise/fatigue. Negative for fever and chills.  HENT: Negative for congestion and hearing loss.   Eyes: Negative for discharge.  Respiratory: Negative for cough, sputum production and shortness of breath.   Cardiovascular: Negative for chest pain, palpitations and leg swelling.  Gastrointestinal: Negative for heartburn, nausea, vomiting, abdominal pain, diarrhea, constipation and blood in stool.  Genitourinary: Negative for dysuria, urgency, frequency and hematuria.  Musculoskeletal: Positive for back pain, joint pain and neck pain. Negative for myalgias and falls.  Skin: Negative for rash.  Neurological: Negative for dizziness, sensory change, loss of consciousness, weakness and headaches.  Endo/Heme/Allergies: Negative for environmental allergies. Does not bruise/bleed easily.  Psychiatric/Behavioral: Negative for depression and suicidal ideas. The patient is not nervous/anxious and does not have insomnia.        Objective:    Physical Exam  Constitutional: He is oriented to person, place, and time. He appears well-developed and well-nourished. No distress.  HENT:  Head: Normocephalic and atraumatic.  Eyes: Conjunctivae are normal.  Neck: Neck supple. No thyromegaly present.  Cardiovascular: Normal rate, regular rhythm and normal heart sounds.   No murmur heard. Pulmonary/Chest: Effort normal and breath sounds normal. No respiratory distress. He has no wheezes.  Abdominal: Soft. Bowel sounds are normal. He exhibits no mass. There is no tenderness.  Musculoskeletal: He exhibits no edema.  Lymphadenopathy:    He has no cervical adenopathy.  Neurological: He is alert and oriented to person, place, and time.  Skin: Skin is  warm and dry.  Psychiatric: He has a normal mood and affect. His behavior is normal.    BP 140/78 mmHg  Pulse 86  Temp(Src) 97.7 F (36.5 C) (Oral)  Ht 5\' 11"  (1.803 m)  Wt 213 lb 6 oz (96.786 kg)  BMI 29.77 kg/m2  SpO2 96% Wt Readings from Last 3 Encounters:  09/07/15 213 lb 6 oz (96.786 kg)  03/06/15 216 lb 4 oz (98.09 kg)  09/05/14 214 lb 6 oz (97.24 kg)     Lab Results  Component Value  Date   WBC 7.6 09/01/2015   HGB 13.0 09/01/2015   HCT 38.4* 09/01/2015   PLT 352.0 09/01/2015   GLUCOSE 136* 09/01/2015   CHOL 117 09/01/2015   TRIG 87.0 09/01/2015   HDL 29.10* 09/01/2015   LDLCALC 70 09/01/2015   ALT 22 09/01/2015   AST 18 09/01/2015   NA 138 09/01/2015   K 3.9 09/01/2015   CL 104 09/01/2015   CREATININE 1.26 09/01/2015   BUN 18 09/01/2015   CO2 29 09/01/2015   TSH 1.89 09/01/2015   PSA 0.53 06/17/2013   HGBA1C 6.9* 09/01/2015   MICROALBUR 1.66 03/15/2013    Lab Results  Component Value Date   TSH 1.89 09/01/2015   Lab Results  Component Value Date   WBC 7.6 09/01/2015   HGB 13.0 09/01/2015   HCT 38.4* 09/01/2015   MCV 78.6 09/01/2015   PLT 352.0 09/01/2015   Lab Results  Component Value Date   NA 138 09/01/2015   K 3.9 09/01/2015   CO2 29 09/01/2015   GLUCOSE 136* 09/01/2015   BUN 18 09/01/2015   CREATININE 1.26 09/01/2015   BILITOT 0.5 09/01/2015   ALKPHOS 46 09/01/2015   AST 18 09/01/2015   ALT 22 09/01/2015   PROT 7.2 09/01/2015   ALBUMIN 4.2 09/01/2015   CALCIUM 9.0 09/01/2015   GFR 61.29 09/01/2015   Lab Results  Component Value Date   CHOL 117 09/01/2015   Lab Results  Component Value Date   HDL 29.10* 09/01/2015   Lab Results  Component Value Date   LDLCALC 70 09/01/2015   Lab Results  Component Value Date   TRIG 87.0 09/01/2015   Lab Results  Component Value Date   CHOLHDL 4 09/01/2015   Lab Results  Component Value Date   HGBA1C 6.9* 09/01/2015       Assessment & Plan:   Problem List Items Addressed  This Visit    Anemia    Mild. Increase leafy greens, consider increased lean red meat and using cast iron cookware. Continue to monitor, report any concerns. May be contributing to RLS      Relevant Orders   Hemoglobin A1c   Lipid panel   CBC   Comprehensive metabolic panel   TSH   Arthritis    Achy most notable under left knee worse with activity. Encouraged ice and topical meds.      Relevant Orders   Hemoglobin A1c   Lipid panel   CBC   Comprehensive metabolic panel   TSH   Diabetes mellitus type 2 in obese (HCC) - Primary    hgba1c acceptable, minimize simple carbs. Increase exercise as tolerated. Continue current meds      Relevant Orders   Hemoglobin A1c   Lipid panel   CBC   Comprehensive metabolic panel   TSH   Esophageal reflux    Avoid offending foods, start probiotics. Do not eat large meals in late evening and consider raising head of bed.       Relevant Orders   Hemoglobin A1c   Lipid panel   CBC   Comprehensive metabolic panel   TSH   Essential hypertension    Well controlled, no changes to meds. Encouraged heart healthy diet such as the DASH diet and exercise as tolerated.       Relevant Orders   Hemoglobin A1c   Lipid panel   CBC   Comprehensive metabolic panel   TSH   Hyperlipidemia, mixed    Encouraged heart healthy diet,  increase exercise, avoid trans fats, consider a krill oil cap daily      Relevant Orders   Hemoglobin A1c   Lipid panel   CBC   Comprehensive metabolic panel   TSH   Insomnia    Encouraged good sleep hygiene such as dark, quiet room. No blue/green glowing lights such as computer screens in bedroom. No alcohol or stimulants in evening. Cut down on caffeine as able. Regular exercise is helpful but not just prior to bed time.       Obstructive sleep apnea    Using CPAP but has trouble sleeping with it with his allergies. Encouraged to use regularly or to let us add a new allergy med, he declines for now.       Relevant Orders   Hemoglobin A1c   Lipid panel   CBC   Comprehensive metabolic panel   TSH   Preventative health care    Patient encouraged to maintain heart healthy diet, regular exercise, adequate sleep. Consider daily probiotics. Take medications as prescribed.       Relevant Orders   Hemoglobin A1c   Lipid panel   CBC   Comprehensive metabolic panel   TSH   RLS (restless legs syndrome)    Encouraged increased hydration and increase iron if no improvement encouraged increased iron in diet. Will consider meds at that time      Relevant Orders   Hemoglobin A1c   Lipid panel   CBC   Comprehensive metabolic panel   TSH   Tremor    Noted in hands worse with use. Report if worsens, presently not affecting daily activity      Relevant Orders   Hemoglobin A1c   Lipid panel   CBC   Comprehensive metabolic panel   TSH      I am having Mr. Renberg maintain his Vitamin B 12, Calcium-Magnesium-Zinc, Chelated Potassium, Cinnamon, Omega-3 Fatty Acids (FISH OIL PO), glucose blood, ONE TOUCH LANCETS, vitamin C, Co Q-10, GLUCOSAMINE CHONDR 1500 COMPLX, fluticasone, OVER THE COUNTER MEDICATION, cetirizine, Quercetin, zoster vaccine live (PF), pneumococcal 13-valent conjugate vaccine, amLODipine, simvastatin, metFORMIN, and lisinopril.  No orders of the defined types were placed in this encounter.     Penni Homans, MD

## 2015-09-07 NOTE — Assessment & Plan Note (Signed)
Using CPAP but has trouble sleeping with it with his allergies. Encouraged to use regularly or to let us add a new allergy med, he declines for now.

## 2015-09-07 NOTE — Patient Instructions (Addendum)
Try topical treatments such Salon Pas or Aspercreme topical cremes or patches for pain  64 oz of clear fluids  Increase leafy greens, consider increased lean red meat and using cast iron cookware. Continue to monitor, report any concerns Cleanse area with ingrown hairs with Witch Hazel Astringent as aftershave     Preventive Care for Adults, Male A healthy lifestyle and preventive care can promote health and wellness. Preventive health guidelines for men include the following key practices:  A routine yearly physical is a good way to check with your health care provider about your health and preventative screening. It is a chance to share any concerns and updates on your health and to receive a thorough exam.  Visit your dentist for a routine exam and preventative care every 6 months. Brush your teeth twice a day and floss once a day. Good oral hygiene prevents tooth decay and gum disease.  The frequency of eye exams is based on your age, health, family medical history, use of contact lenses, and other factors. Follow your health care provider's recommendations for frequency of eye exams.  Eat a healthy diet. Foods such as vegetables, fruits, whole grains, low-fat dairy products, and lean protein foods contain the nutrients you need without too many calories. Decrease your intake of foods high in solid fats, added sugars, and salt. Eat the right amount of calories for you.Get information about a proper diet from your health care provider, if necessary.  Regular physical exercise is one of the most important things you can do for your health. Most adults should get at least 150 minutes of moderate-intensity exercise (any activity that increases your heart rate and causes you to sweat) each week. In addition, most adults need muscle-strengthening exercises on 2 or more days a week.  Maintain a healthy weight. The body mass index (BMI) is a screening tool to identify possible weight problems. It  provides an estimate of body fat based on height and weight. Your health care provider can find your BMI and can help you achieve or maintain a healthy weight.For adults 20 years and older:  A BMI below 18.5 is considered underweight.  A BMI of 18.5 to 24.9 is normal.  A BMI of 25 to 29.9 is considered overweight.  A BMI of 30 and above is considered obese.  Maintain normal blood lipids and cholesterol levels by exercising and minimizing your intake of saturated fat. Eat a balanced diet with plenty of fruit and vegetables. Blood tests for lipids and cholesterol should begin at age 80 and be repeated every 5 years. If your lipid or cholesterol levels are high, you are over 50, or you are at high risk for heart disease, you may need your cholesterol levels checked more frequently.Ongoing high lipid and cholesterol levels should be treated with medicines if diet and exercise are not working.  If you smoke, find out from your health care provider how to quit. If you do not use tobacco, do not start.  Lung cancer screening is recommended for adults aged 64-80 years who are at high risk for developing lung cancer because of a history of smoking. A yearly low-dose CT scan of the lungs is recommended for people who have at least a 30-pack-year history of smoking and are a current smoker or have quit within the past 15 years. A pack year of smoking is smoking an average of 1 pack of cigarettes a day for 1 year (for example: 1 pack a day for 30 years  or 2 packs a day for 15 years). Yearly screening should continue until the smoker has stopped smoking for at least 15 years. Yearly screening should be stopped for people who develop a health problem that would prevent them from having lung cancer treatment.  If you choose to drink alcohol, do not have more than 2 drinks per day. One drink is considered to be 12 ounces (355 mL) of beer, 5 ounces (148 mL) of wine, or 1.5 ounces (44 mL) of liquor.  Avoid use of  street drugs. Do not share needles with anyone. Ask for help if you need support or instructions about stopping the use of drugs.  High blood pressure causes heart disease and increases the risk of stroke. Your blood pressure should be checked at least every 1-2 years. Ongoing high blood pressure should be treated with medicines, if weight loss and exercise are not effective.  If you are 60-60 years old, ask your health care provider if you should take aspirin to prevent heart disease.  Diabetes screening is done by taking a blood sample to check your blood glucose level after you have not eaten for a certain period of time (fasting). If you are not overweight and you do not have risk factors for diabetes, you should be screened once every 3 years starting at age 29. If you are overweight or obese and you are 90-75 years of age, you should be screened for diabetes every year as part of your cardiovascular risk assessment.  Colorectal cancer can be detected and often prevented. Most routine colorectal cancer screening begins at the age of 53 and continues through age 27. However, your health care provider may recommend screening at an earlier age if you have risk factors for colon cancer. On a yearly basis, your health care provider may provide home test kits to check for hidden blood in the stool. Use of a small camera at the end of a tube to directly examine the colon (sigmoidoscopy or colonoscopy) can detect the earliest forms of colorectal cancer. Talk to your health care provider about this at age 5, when routine screening begins. Direct exam of the colon should be repeated every 5-10 years through age 76, unless early forms of precancerous polyps or small growths are found.  People who are at an increased risk for hepatitis B should be screened for this virus. You are considered at high risk for hepatitis B if:  You were born in a country where hepatitis B occurs often. Talk with your health care  provider about which countries are considered high risk.  Your parents were born in a high-risk country and you have not received a shot to protect against hepatitis B (hepatitis B vaccine).  You have HIV or AIDS.  You use needles to inject street drugs.  You live with, or have sex with, someone who has hepatitis B.  You are a man who has sex with other men (MSM).  You get hemodialysis treatment.  You take certain medicines for conditions such as cancer, organ transplantation, and autoimmune conditions.  Hepatitis C blood testing is recommended for all people born from [birth year] through 1965 and any individual with known risks for hepatitis C.  Practice safe sex. Use condoms and avoid high-risk sexual practices to reduce the spread of sexually transmitted infections (STIs). STIs include gonorrhea, chlamydia, syphilis, trichomonas, herpes, HPV, and human immunodeficiency virus (HIV). Herpes, HIV, and HPV are viral illnesses that have no cure. They can result in disability, cancer,  and death.  If you are a man who has sex with other men, you should be screened at least once per year for:  HIV.  Urethral, rectal, and pharyngeal infection of gonorrhea, chlamydia, or both.  If you are at risk of being infected with HIV, it is recommended that you take a prescription medicine daily to prevent HIV infection. This is called preexposure prophylaxis (PrEP). You are considered at risk if:  You are a man who has sex with other men (MSM) and have other risk factors.  You are a heterosexual man, are sexually active, and are at increased risk for HIV infection.  You take drugs by injection.  You are sexually active with a partner who has HIV.  Talk with your health care provider about whether you are at high risk of being infected with HIV. If you choose to begin PrEP, you should first be tested for HIV. You should then be tested every 3 months for as long as you are taking PrEP.  A one-time  screening for abdominal aortic aneurysm (AAA) and surgical repair of large AAAs by ultrasound are recommended for men ages 18 to 41 years who are current or former smokers.  Healthy men should no longer receive prostate-specific antigen (PSA) blood tests as part of routine cancer screening. Talk with your health care provider about prostate cancer screening.  Testicular cancer screening is not recommended for adult males who have no symptoms. Screening includes self-exam, a health care provider exam, and other screening tests. Consult with your health care provider about any symptoms you have or any concerns you have about testicular cancer.  Use sunscreen. Apply sunscreen liberally and repeatedly throughout the day. You should seek shade when your shadow is shorter than you. Protect yourself by wearing long sleeves, pants, a wide-brimmed hat, and sunglasses year round, whenever you are outdoors.  Once a month, do a whole-body skin exam, using a mirror to look at the skin on your back. Tell your health care provider about new moles, moles that have irregular borders, moles that are larger than a pencil eraser, or moles that have changed in shape or color.  Stay current with required vaccines (immunizations).  Influenza vaccine. All adults should be immunized every year.  Tetanus, diphtheria, and acellular pertussis (Td, Tdap) vaccine. An adult who has not previously received Tdap or who does not know his vaccine status should receive 1 dose of Tdap. This initial dose should be followed by tetanus and diphtheria toxoids (Td) booster doses every 10 years. Adults with an unknown or incomplete history of completing a 3-dose immunization series with Td-containing vaccines should begin or complete a primary immunization series including a Tdap dose. Adults should receive a Td booster every 10 years.  Varicella vaccine. An adult without evidence of immunity to varicella should receive 2 doses or a second  dose if he has previously received 1 dose.  Human papillomavirus (HPV) vaccine. Males aged 11-21 years who have not received the vaccine previously should receive the 3-dose series. Males aged 22-26 years may be immunized. Immunization is recommended through the age of 45 years for any male who has sex with males and did not get any or all doses earlier. Immunization is recommended for any person with an immunocompromised condition through the age of 86 years if he did not get any or all doses earlier. During the 3-dose series, the second dose should be obtained 4-8 weeks after the first dose. The third dose should be obtained  24 weeks after the first dose and 16 weeks after the second dose.  Zoster vaccine. One dose is recommended for adults aged 78 years or older unless certain conditions are present.  Measles, mumps, and rubella (MMR) vaccine. Adults born before [birth year] generally are considered immune to measles and mumps. Adults born in [birth year] or later should have 1 or more doses of MMR vaccine unless there is a contraindication to the vaccine or there is laboratory evidence of immunity to each of the three diseases. A routine second dose of MMR vaccine should be obtained at least 28 days after the first dose for students attending postsecondary schools, health care workers, or international travelers. People who received inactivated measles vaccine or an unknown type of measles vaccine during 1963-1967 should receive 2 doses of MMR vaccine. People who received inactivated mumps vaccine or an unknown type of mumps vaccine before 1979 and are at high risk for mumps infection should consider immunization with 2 doses of MMR vaccine. Unvaccinated health care workers born before [birth year] who lack laboratory evidence of measles, mumps, or rubella immunity or laboratory confirmation of disease should consider measles and mumps immunization with 2 doses of MMR vaccine or rubella immunization with 1 dose of MMR  vaccine.  Pneumococcal 13-valent conjugate (PCV13) vaccine. When indicated, a person who is uncertain of his immunization history and has no record of immunization should receive the PCV13 vaccine. All adults 75 years of age and older should receive this vaccine. An adult aged 69 years or older who has certain medical conditions and has not been previously immunized should receive 1 dose of PCV13 vaccine. This PCV13 should be followed with a dose of pneumococcal polysaccharide (PPSV23) vaccine. Adults who are at high risk for pneumococcal disease should obtain the PPSV23 vaccine at least 8 weeks after the dose of PCV13 vaccine. Adults older than 64 years of age who have normal immune system function should obtain the PPSV23 vaccine dose at least 1 year after the dose of PCV13 vaccine.  Pneumococcal polysaccharide (PPSV23) vaccine. When PCV13 is also indicated, PCV13 should be obtained first. All adults aged 25 years and older should be immunized. An adult younger than age 49 years who has certain medical conditions should be immunized. Any person who resides in a nursing home or long-term care facility should be immunized. An adult smoker should be immunized. People with an immunocompromised condition and certain other conditions should receive both PCV13 and PPSV23 vaccines. People with human immunodeficiency virus (HIV) infection should be immunized as soon as possible after diagnosis. Immunization during chemotherapy or radiation therapy should be avoided. Routine use of PPSV23 vaccine is not recommended for American Indians, [place] Natives, or people younger than 65 years unless there are medical conditions that require PPSV23 vaccine. When indicated, people who have unknown immunization and have no record of immunization should receive PPSV23 vaccine. One-time revaccination 5 years after the first dose of PPSV23 is recommended for people aged 19-64 years who have chronic kidney failure, nephrotic syndrome,  asplenia, or immunocompromised conditions. People who received 1-2 doses of PPSV23 before age 27 years should receive another dose of PPSV23 vaccine at age 29 years or later if at least 5 years have passed since the previous dose. Doses of PPSV23 are not needed for people immunized with PPSV23 at or after age 46 years.  Meningococcal vaccine. Adults with asplenia or persistent complement component deficiencies should receive 2 doses of quadrivalent meningococcal conjugate (MenACWY-D) vaccine. The doses should be obtained at  least 2 months apart. Microbiologists working with certain meningococcal bacteria, [employer] recruits, people at risk during an outbreak, and people who travel to or live in countries with a high rate of meningitis should be immunized. A first-year college student up through age 71 years who is living in a residence hall should receive a dose if he did not receive a dose on or after his 16th birthday. Adults who have certain high-risk conditions should receive one or more doses of vaccine.  Hepatitis A vaccine. Adults who wish to be protected from this disease, have chronic liver disease, work with hepatitis A-infected animals, work in hepatitis A research labs, or travel to or work in countries with a high rate of hepatitis A should be immunized. Adults who were previously unvaccinated and who anticipate close contact with an international adoptee during the first 60 days after arrival in the [place] from a country with a high rate of hepatitis A should be immunized.  Hepatitis B vaccine. Adults should be immunized if they wish to be protected from this disease, are under age 53 years and have diabetes, have chronic liver disease, have had more than one sex partner in the past 6 months, may be exposed to blood or other infectious body fluids, are household contacts or sex partners of hepatitis B positive people, are clients or workers in certain care facilities, or travel to or work  in countries with a high rate of hepatitis B.  Haemophilus influenzae type b (Hib) vaccine. A previously unvaccinated person with asplenia or sickle cell disease or having a scheduled splenectomy should receive 1 dose of Hib vaccine. Regardless of previous immunization, a recipient of a hematopoietic stem cell transplant should receive a 3-dose series 6-12 months after his successful transplant. Hib vaccine is not recommended for adults with HIV infection. Preventive Service / Frequency Ages 29 to 30  Blood pressure check.** / Every 3-5 years.  Lipid and cholesterol check.** / Every 5 years beginning at age 77.  Hepatitis C blood test.** / For any individual with known risks for hepatitis C.  Skin self-exam. / Monthly.  Influenza vaccine. / Every year.  Tetanus, diphtheria, and acellular pertussis (Tdap, Td) vaccine.** / Consult your health care provider. 1 dose of Td every 10 years.  Varicella vaccine.** / Consult your health care provider.  HPV vaccine. / 3 doses over 6 months, if 6 or younger.  Measles, mumps, rubella (MMR) vaccine.** / You need at least 1 dose of MMR if you were born in 1957 or later. You may also need a second dose.  Pneumococcal 13-valent conjugate (PCV13) vaccine.** / Consult your health care provider.  Pneumococcal polysaccharide (PPSV23) vaccine.** / 1 to 2 doses if you smoke cigarettes or if you have certain conditions.  Meningococcal vaccine.** / 1 dose if you are age 9 to 13 years and a [employer] researcher living in a residence hall, or have one of several medical conditions. You may also need additional booster doses.  Hepatitis A vaccine.** / Consult your health care provider.  Hepatitis B vaccine.** / Consult your health care provider.  Haemophilus influenzae type b (Hib) vaccine.** / Consult your health care provider. Ages 1 to 72  Blood pressure check.** / Every year.  Lipid and cholesterol check.** / Every 5 years beginning at age  39.  Lung cancer screening. / Every year if you are aged 15-80 years and have a 30-pack-year history of smoking and currently smoke or have quit within the past 15  years. Yearly screening is stopped once you have quit smoking for at least 15 years or develop a health problem that would prevent you from having lung cancer treatment.  Fecal occult blood test (FOBT) of stool. / Every year beginning at age 96 and continuing until age 73. You may not have to do this test if you get a colonoscopy every 10 years.  Flexible sigmoidoscopy** or colonoscopy.** / Every 5 years for a flexible sigmoidoscopy or every 10 years for a colonoscopy beginning at age 77 and continuing until age 23.  Hepatitis C blood test.** / For all people born from [birth year] through 1965 and any individual with known risks for hepatitis C.  Skin self-exam. / Monthly.  Influenza vaccine. / Every year.  Tetanus, diphtheria, and acellular pertussis (Tdap/Td) vaccine.** / Consult your health care provider. 1 dose of Td every 10 years.  Varicella vaccine.** / Consult your health care provider.  Zoster vaccine.** / 1 dose for adults aged 31 years or older.  Measles, mumps, rubella (MMR) vaccine.** / You need at least 1 dose of MMR if you were born in 1957 or later. You may also need a second dose.  Pneumococcal 13-valent conjugate (PCV13) vaccine.** / Consult your health care provider.  Pneumococcal polysaccharide (PPSV23) vaccine.** / 1 to 2 doses if you smoke cigarettes or if you have certain conditions.  Meningococcal vaccine.** / Consult your health care provider.  Hepatitis A vaccine.** / Consult your health care provider.  Hepatitis B vaccine.** / Consult your health care provider.  Haemophilus influenzae type b (Hib) vaccine.** / Consult your health care provider. Ages 78 and over  Blood pressure check.** / Every year.  Lipid and cholesterol check.**/ Every 5 years beginning at age 61.  Lung cancer screening. / Every  year if you are aged 22-80 years and have a 30-pack-year history of smoking and currently smoke or have quit within the past 15 years. Yearly screening is stopped once you have quit smoking for at least 15 years or develop a health problem that would prevent you from having lung cancer treatment.  Fecal occult blood test (FOBT) of stool. / Every year beginning at age 69 and continuing until age 44. You may not have to do this test if you get a colonoscopy every 10 years.  Flexible sigmoidoscopy** or colonoscopy.** / Every 5 years for a flexible sigmoidoscopy or every 10 years for a colonoscopy beginning at age 68 and continuing until age 54.  Hepatitis C blood test.** / For all people born from [birth year] through 1964 and any individual with known risks for hepatitis C.  Abdominal aortic aneurysm (AAA) screening.** / A one-time screening for ages 38 to 34 years who are current or former smokers.  Skin self-exam. / Monthly.  Influenza vaccine. / Every year.  Tetanus, diphtheria, and acellular pertussis (Tdap/Td) vaccine.** / 1 dose of Td every 10 years.  Varicella vaccine.** / Consult your health care provider.  Zoster vaccine.** / 1 dose for adults aged 45 years or older.  Pneumococcal 13-valent conjugate (PCV13) vaccine.** / 1 dose for all adults aged 71 years and older.  Pneumococcal polysaccharide (PPSV23) vaccine.** / 1 dose for all adults aged 63 years and older.  Meningococcal vaccine.** / Consult your health care provider.  Hepatitis A vaccine.** / Consult your health care provider.  Hepatitis B vaccine.** / Consult your health care provider.  Haemophilus influenzae type b (Hib) vaccine.** / Consult your health care provider. **Family history and personal history of risk and  conditions may change your health care provider's recommendations.   This information is not intended to replace advice given to you by your health care provider. Make sure you discuss any questions you have  with your health care provider.   Document Released: 08/23/2001 Document Revised: 07/18/2014 Document Reviewed: 11/22/2010 Elsevier Interactive Patient Education [employer].

## 2015-09-07 NOTE — Assessment & Plan Note (Signed)
Achy most notable under left knee worse with activity. Encouraged ice and topical meds.

## 2015-09-07 NOTE — Assessment & Plan Note (Signed)
Noted in hands worse with use. Report if worsens, presently not affecting daily activity

## 2015-09-07 NOTE — Assessment & Plan Note (Signed)
Encouraged heart healthy diet, increase exercise, avoid trans fats, consider a krill oil cap daily 

## 2015-09-07 NOTE — Assessment & Plan Note (Signed)
hgba1c acceptable, minimize simple carbs. Increase exercise as tolerated. Continue current meds 

## 2015-09-13 ENCOUNTER — Encounter: Payer: Self-pay | Admitting: Family Medicine

## 2015-09-13 DIAGNOSIS — G4700 Insomnia, unspecified: Secondary | ICD-10-CM

## 2015-09-13 HISTORY — DX: Insomnia, unspecified: G47.00

## 2015-09-13 NOTE — Assessment & Plan Note (Signed)
Encouraged good sleep hygiene such as dark, quiet room. No blue/green glowing lights such as computer screens in bedroom. No alcohol or stimulants in evening. Cut down on caffeine as able. Regular exercise is helpful but not just prior to bed time.  

## 2015-10-07 LAB — HM DIABETES EYE EXAM

## 2015-10-09 ENCOUNTER — Encounter: Payer: Self-pay | Admitting: Family Medicine

## 2015-10-20 ENCOUNTER — Other Ambulatory Visit: Payer: Self-pay | Admitting: Family Medicine

## 2015-11-01 ENCOUNTER — Other Ambulatory Visit: Payer: Self-pay | Admitting: Family Medicine

## 2015-11-24 ENCOUNTER — Other Ambulatory Visit: Payer: Self-pay | Admitting: Family Medicine

## 2016-01-20 ENCOUNTER — Other Ambulatory Visit: Payer: Self-pay | Admitting: Family Medicine

## 2016-01-20 NOTE — Telephone Encounter (Signed)
Medication filled to pharmacy as requested.   

## 2016-02-18 ENCOUNTER — Other Ambulatory Visit: Payer: Self-pay | Admitting: Family Medicine

## 2016-05-18 ENCOUNTER — Other Ambulatory Visit: Payer: Self-pay | Admitting: Family Medicine

## 2016-06-01 ENCOUNTER — Encounter: Payer: Self-pay | Admitting: Gastroenterology

## 2016-06-17 ENCOUNTER — Other Ambulatory Visit: Payer: Self-pay | Admitting: Family Medicine

## 2016-07-18 ENCOUNTER — Other Ambulatory Visit: Payer: Self-pay | Admitting: Family Medicine

## 2016-08-01 ENCOUNTER — Other Ambulatory Visit: Payer: Self-pay | Admitting: Family Medicine

## 2016-09-08 ENCOUNTER — Encounter: Payer: Self-pay | Admitting: Family Medicine

## 2016-09-15 ENCOUNTER — Other Ambulatory Visit: Payer: Self-pay | Admitting: Family Medicine

## 2016-10-11 LAB — HM DIABETES EYE EXAM

## 2016-10-17 ENCOUNTER — Other Ambulatory Visit: Payer: Self-pay | Admitting: Family Medicine

## 2016-10-25 ENCOUNTER — Encounter: Payer: Self-pay | Admitting: Family Medicine

## 2016-11-01 ENCOUNTER — Other Ambulatory Visit: Payer: Self-pay | Admitting: Family Medicine

## 2016-11-09 ENCOUNTER — Telehealth: Payer: Self-pay | Admitting: Medical

## 2016-11-09 ENCOUNTER — Ambulatory Visit (HOSPITAL_BASED_OUTPATIENT_CLINIC_OR_DEPARTMENT_OTHER)
Admission: RE | Admit: 2016-11-09 | Discharge: 2016-11-09 | Disposition: A | Discharge status: Home / Self Care | Payer: Medicare Other | Source: Ambulatory Visit | Attending: Medical | Admitting: Medical

## 2016-11-09 ENCOUNTER — Ambulatory Visit (INDEPENDENT_AMBULATORY_CARE_PROVIDER_SITE_OTHER): Payer: Medicare Other | Admitting: Medical

## 2016-11-09 ENCOUNTER — Encounter: Payer: Self-pay | Admitting: Medical

## 2016-11-09 VITALS — BP 155/85 | HR 80 | Temp 97.5°F | Resp 16 | Ht 71.0 in | Wt 207.0 lb

## 2016-11-09 DIAGNOSIS — M25522 Pain in left elbow: Secondary | ICD-10-CM | POA: Diagnosis not present

## 2016-11-09 DIAGNOSIS — M25422 Effusion, left elbow: Secondary | ICD-10-CM | POA: Diagnosis not present

## 2016-11-09 DIAGNOSIS — M7022 Olecranon bursitis, left elbow: Secondary | ICD-10-CM | POA: Diagnosis not present

## 2016-11-09 DIAGNOSIS — M7989 Other specified soft tissue disorders: Secondary | ICD-10-CM | POA: Diagnosis not present

## 2016-11-09 MED ORDER — CEPHALEXIN 500 MG PO CAPS
500.0000 mg | ORAL_CAPSULE | Freq: Two times a day (BID) | ORAL | 0 refills | Status: DC
Start: 2016-11-09 — End: 2017-01-10

## 2016-11-09 MED ORDER — CEPHALEXIN 500 MG PO CAPS: 500.0000 mg | ORAL_CAPSULE | Freq: Two times a day (BID) | ORAL | 0 refills | Status: DC

## 2016-11-09 MED ORDER — DICLOFENAC POTASSIUM 50 MG PO TABS: 50.0000 mg | ORAL_TABLET | Freq: Three times a day (TID) | ORAL | 0 refills | Status: DC

## 2016-11-09 MED ORDER — CEPHALEXIN 500 MG PO CAPS
500.0000 mg | ORAL_CAPSULE | Freq: Two times a day (BID) | ORAL | 0 refills | Status: DC
Start: 2016-11-09 — End: 2016-11-09

## 2016-11-09 NOTE — Progress Notes (Signed)
Pre visit review using our clinic review tool, if applicable. No additional management support is needed unless otherwise documented below in the visit note. 

## 2016-11-09 NOTE — Patient Instructions (Addendum)
For bursitis of elbow will rx diclofenac and advise ice area twice daily 10-15 minutes.  Will get xray of elbow. Refer to sports med.  If area get worse/more redness then start keflex antibiotic.(print rx given)  Follow up 5 days or as needed(Follow up here in our office if referral to sports med delayed)   Elbow Bursitis A bursa is a fluid-filled sac that covers and protects a joint. Bursitis is when the fluid-filled sac gets puffy and sore (inflamed). Elbow bursitis, also called olecranon bursitis, happens over your elbow. This may be caused by:  Injury (acute trauma) to your elbow.  Leaning on hard surfaces for long periods of time.  Infection from an injury that breaks the skin near your elbow.  A bone growth (spur) that forms at the tip of your elbow.  A medical condition that causes inflammation in your body, such as:  Gout.  Rheumatoid arthritis. Sometimes the cause is not known. Follow these instructions at home:  Take medicines only as told by your doctor.  If you were prescribed an antibiotic medicine, finish all of it even if you start to feel better.  If your bursitis is caused by an injury, rest your elbow and wear your bandage as told by your doctor. You may also apply ice to the injured area as told by your doctor:  Put ice in a plastic bag.  Place a towel between your skin and the bag.  Leave the ice on for 20 minutes, 2-3 times per day.  Do not do any activities that cause pain to your elbow.  Use elbow pads or wraps to cushion your elbow. Contact a doctor if:  You have a fever.  Your symptoms do not get better with treatment.  Your pain or swelling gets worse.  Your pain or swelling goes away and then comes back.  You have drainage of pus from the swollen area over your elbow. This information is not intended to replace advice given to you by your health care provider. Make sure you discuss any questions you have with your health care  provider. Document Released: 12/15/2009 Document Revised: 12/03/2015 Document Reviewed: 03/05/2014 Elsevier Interactive Patient Education  2017 [employer].

## 2016-11-09 NOTE — Telephone Encounter (Signed)
[facility] [place]. [facility] [place].

## 2016-11-09 NOTE — Progress Notes (Signed)
Subjective:    Patient ID: Evan Best, male    DOB: 1950/09/15, 66 y.o.   MRN: [id number]  HPI  Pt in with some swelling of his left elbow. He states swollen for 4 weeks. At beginning was very small lump. But has gotten larger gradually. No fever, no chills or sweats.  Not trauma to area.   Early on before got real swollen the skin looks mild inflamed and skin peeled back just little. He applied antibiotic(wife actually got better look at the area). He not sure what appearance looked like per his report.  He states he uses gel pad  to rest elbow against. But in past he would  lean over on elbow when he looks at computer for hours.   Review of Systems  Constitutional: Negative for chills, fatigue and fever.  Respiratory: Negative for cough, chest tightness, shortness of breath and wheezing.   Cardiovascular: Negative for chest pain and palpitations.  Musculoskeletal:       Left elbow- swollen olecranon bursae.  Skin: Negative for rash.  Hematological: Negative for adenopathy. Does not bruise/bleed easily.  Psychiatric/Behavioral: Negative for behavioral problems and confusion.    Past Medical History:  Diagnosis Date  . Allergic state 09/22/2013  . Anemia 09/07/2015  . Arthritis 03/06/2015  . Diabetes mellitus type II 1/09  . Esophageal reflux 03/06/2015  . Gout 09/18/2012   Left big toe  . Hypertension   . Insomnia 09/13/2015  . Migraine headache    remote history  . OSA (obstructive sleep apnea)   . Preventative health care 09/05/2014  . Right shoulder pain 02/19/2009   Qualifier: Diagnosis of  By: Wynona Luna   . RLS (restless legs syndrome) 09/07/2015  . Tremor 09/07/2015     Social History   Social History  . Marital status: Married    Spouse name: N/A  . Number of children: N/A  . Years of education: N/A   Occupational History  . Not on file.   Social History Main Topics  . Smoking status: Never Smoker  . Smokeless tobacco: Never Used  . Alcohol  use No  . Drug use: No  . Sexual activity: Not on file   Other Topics Concern  . Not on file   Social History Narrative   Occupation:  Chemical engineer   Married  -  1 daughter (age 54)    Never Smoked   Alcohol use-no     Drug use-no           Past Surgical History:  Procedure Laterality Date  . HERNIA REPAIR  2542   umbilical hernia repair  . KNEE SURGERY     left knee surgery  . TONSILLECTOMY  1959  . VASECTOMY      Family History  Problem Relation Age of Onset  . Breast cancer Mother   . Hypertension Mother   . Hyperlipidemia Father   . Kidney disease Father   . Heart disease Father   . Hypertension Father   . Colon cancer Neg Hx   . Stomach cancer Neg Hx     No Known Allergies  Current Outpatient Prescriptions on File Prior to Visit  Medication Sig Dispense Refill  . amLODipine (NORVASC) 10 MG tablet TAKE ONE TABLET BY MOUTH ONCE DAILY 90 tablet 0  . Calcium-Magnesium-Zinc 1000-400-15 MG TABS Take by mouth daily.      . cetirizine (ZYRTEC) 10 MG tablet Take 10 mg by mouth daily.    Marland Kitchen  Chelated Potassium 99 MG TABS Take 99 mg by mouth 2 (two) times daily.     . Cinnamon 500 MG capsule Take 500 mg by mouth 2 (two) times daily. +50 mg Chromium in each supplement.    . Coenzyme Q10 (CO Q-10) 100 MG CAPS Take 100 mg by mouth daily.      . Cyanocobalamin (VITAMIN B 12) 250 MCG LOZG Take 250 mcg by mouth 2 (two) times a week.      . fluticasone (FLONASE) 50 MCG/ACT nasal spray Place 2 sprays into both nostrils daily. 16 g 6  . Glucosamine-Chondroit-Vit C-Mn (GLUCOSAMINE CHONDR 1500 COMPLX) CAPS Take by mouth daily. 1500 mg Glucosamine + 1200 mg Chondroitin QD.    Marland Kitchen glucose blood (ONE TOUCH TEST STRIPS) test strip Use as instructed to check blood sugar up to three times per day     . lisinopril (PRINIVIL,ZESTRIL) 40 MG tablet TAKE ONE TABLET BY MOUTH ONCE DAILY 90 tablet 1  . metFORMIN (GLUCOPHAGE) 500 MG tablet TAKE ONE TABLET BY MOUTH ONCE DAILY WITH BREAKFAST  90 tablet 1  . Omega-3 Fatty Acids (FISH OIL PO) Take 1,500 mg by mouth daily.      . ONE TOUCH LANCETS MISC Use as instructed to check blood sugar for up to three times per day     . OVER THE COUNTER MEDICATION Phosphatidyl choline- 1 capsule bid    . pneumococcal 13-valent conjugate vaccine (PREVNAR 13) SUSP injection Inject 0.5 mLs into the muscle once. 1 Syringe 0  . Quercetin 250 MG TABS Take by mouth daily.    . simvastatin (ZOCOR) 10 MG tablet TAKE ONE TABLET BY MOUTH AT BEDTIME. 30 tablet 1  . vitamin C (ASCORBIC ACID) 500 MG tablet Take 500 mg by mouth 2 (two) times daily.      Marland Kitchen zoster vaccine live, PF, (ZOSTAVAX) 16109 UNT/0.65ML injection Inject 19,400 Units into the skin once. 1 each 0   No current facility-administered medications on file prior to visit.     BP (!) 155/85   Pulse 80   Temp 97.5 F (36.4 C) (Oral)   Resp 16   Ht 5\' 11"  (1.803 m)   Wt 207 lb (93.9 kg)   SpO2 98%   BMI 28.87 kg/m       Objective:   Physical Exam   General- No acute distress. Pleasant patient. Neck- Full range of motion, no jvd Lungs- Clear, even and unlabored. Heart- regular rate and rhythm. Neurologic- CNII- XII grossly intact.  Left elbow- (moderate large olecranon bursae). goot range of motion. NO lymphadenopathy of upper extremity.     Assessment & Plan:  For bursitis of elbow will rx diclofenac and advise ice area twice daily 10-15 minutes.  Will get xray of elbow. Refer to sports med.  If area gets worse/more redness then start keflex antibiotic.(print rx given)  Follow up 5 days or as needed(Follow up here in our office if referral to sports med delayed)  Pt bp mild elevated Rx bp cuff. Try to get cuff with script or get otc.Check bp daily. If bp over 140/90 let us know.    Bentley Fissel, Percell Miller, PA-C

## 2016-11-16 ENCOUNTER — Telehealth: Payer: Self-pay | Admitting: Family Medicine

## 2016-11-16 ENCOUNTER — Encounter: Payer: Self-pay | Admitting: Family Medicine

## 2016-11-16 ENCOUNTER — Ambulatory Visit (INDEPENDENT_AMBULATORY_CARE_PROVIDER_SITE_OTHER): Payer: Medicare Other | Admitting: Family Medicine

## 2016-11-16 VITALS — BP 184/84 | HR 85 | Ht 71.0 in | Wt 205.0 lb

## 2016-11-16 DIAGNOSIS — M25522 Pain in left elbow: Secondary | ICD-10-CM

## 2016-11-16 DIAGNOSIS — M7022 Olecranon bursitis, left elbow: Secondary | ICD-10-CM

## 2016-11-16 MED ORDER — METHYLPREDNISOLONE ACETATE 40 MG/ML IJ SUSP
40.0000 mg | Freq: Once | INTRAMUSCULAR | Status: AC
  Administered 2016-11-16: 40 mg via INTRA_ARTICULAR

## 2016-11-16 NOTE — Patient Instructions (Addendum)
You have olecranon bursitis. Ice the area 3-4 times a day for 15 minutes at a time Diclofenac as needed. Compression wrap or sleeve to help keep swelling down. Consider aspiration and injection of the area as well - we went ahead with this today. Elbow pad for protection to prevent irritation and additional swelling. If you notice red streaking from this, fever above 100.4 degrees, call us. Follow up with [place] in 1 month but call sooner if you're having problems.

## 2016-11-16 NOTE — Telephone Encounter (Signed)
Pt stopped by he has a 3 day supply left of Simvastatin 10 mg. He would like new script for 90 day supply called to [facility] [address] [place] their phone [phone]. Please call pt to confirm. Dr Charlett Blake.

## 2016-11-17 MED ORDER — SIMVASTATIN 10 MG PO TABS: 10.0000 mg | ORAL_TABLET | Freq: Every day | ORAL | 0 refills | Status: DC

## 2016-11-17 NOTE — Telephone Encounter (Signed)
Sent in 90 day supply and let patient know sent in,

## 2016-11-19 DIAGNOSIS — M7022 Olecranon bursitis, left elbow: Secondary | ICD-10-CM | POA: Insufficient documentation

## 2016-11-19 NOTE — Progress Notes (Signed)
PCP: [name], MD  Subjective:   HPI: Patient is a 66 y.o. male here for left elbow pain/swelling.  Patient reports he noticed swelling over his left elbow 2 months ago. No acute injury or trauma but he does lean on his left elbow when on the computer. Pain is dull, 1/10 at worst. He reports this was inflamed and swollen - used a topical antibiotic and this cleared up then swelled. Had some redness - given oral diclofenac which helps. Rx an antibiotic as well but hasn't picked this up - instructed to only take if appears infected. No fevers, chills, sweats. Has been icing. No other skin changes, numbness. Right handed.  Past Medical History:  Diagnosis Date  . Allergic state 09/22/2013  . Anemia 09/07/2015  . Arthritis 03/06/2015  . Diabetes mellitus type II 1/09  . Esophageal reflux 03/06/2015  . Gout 09/18/2012   Left big toe  . Hypertension   . Insomnia 09/13/2015  . Migraine headache    remote history  . OSA (obstructive sleep apnea)   . Preventative health care 09/05/2014  . Right shoulder pain 02/19/2009   Qualifier: Diagnosis of  By: Wynona Luna   . RLS (restless legs syndrome) 09/07/2015  . Tremor 09/07/2015    Current Outpatient Prescriptions on File Prior to Visit  Medication Sig Dispense Refill  . amLODipine (NORVASC) 10 MG tablet TAKE ONE TABLET BY MOUTH ONCE DAILY 90 tablet 0  . Calcium-Magnesium-Zinc 1000-400-15 MG TABS Take by mouth daily.      . cephALEXin (KEFLEX) 500 MG capsule Take 1 capsule (500 mg total) by mouth 2 (two) times daily. 20 capsule 0  . cetirizine (ZYRTEC) 10 MG tablet Take 10 mg by mouth daily.    . Chelated Potassium 99 MG TABS Take 99 mg by mouth 2 (two) times daily.     . Cinnamon 500 MG capsule Take 500 mg by mouth 2 (two) times daily. +50 mg Chromium in each supplement.    . Coenzyme Q10 (CO Q-10) 100 MG CAPS Take 100 mg by mouth daily.      . Cyanocobalamin (VITAMIN B 12) 250 MCG LOZG Take 250 mcg by mouth 2 (two) times a  week.      . diclofenac (CATAFLAM) 50 MG tablet Take 1 tablet (50 mg total) by mouth 3 (three) times daily. 14 tablet 0  . fluticasone (FLONASE) 50 MCG/ACT nasal spray Place 2 sprays into both nostrils daily. 16 g 6  . Glucosamine-Chondroit-Vit C-Mn (GLUCOSAMINE CHONDR 1500 COMPLX) CAPS Take by mouth daily. 1500 mg Glucosamine + 1200 mg Chondroitin QD.    Marland Kitchen glucose blood (ONE TOUCH TEST STRIPS) test strip Use as instructed to check blood sugar up to three times per day     . lisinopril (PRINIVIL,ZESTRIL) 40 MG tablet TAKE ONE TABLET BY MOUTH ONCE DAILY 90 tablet 1  . metFORMIN (GLUCOPHAGE) 500 MG tablet TAKE ONE TABLET BY MOUTH ONCE DAILY WITH BREAKFAST 90 tablet 1  . Omega-3 Fatty Acids (FISH OIL PO) Take 1,500 mg by mouth daily.      . ONE TOUCH LANCETS MISC Use as instructed to check blood sugar for up to three times per day     . OVER THE COUNTER MEDICATION Phosphatidyl choline- 1 capsule bid    . pneumococcal 13-valent conjugate vaccine (PREVNAR 13) SUSP injection Inject 0.5 mLs into the muscle once. 1 Syringe 0  . Quercetin 250 MG TABS Take by mouth daily.    . vitamin C (  ASCORBIC ACID) 500 MG tablet Take 500 mg by mouth 2 (two) times daily.      Marland Kitchen zoster vaccine live, PF, (ZOSTAVAX) 74259 UNT/0.65ML injection Inject 19,400 Units into the skin once. 1 each 0   No current facility-administered medications on file prior to visit.     Past Surgical History:  Procedure Laterality Date  . HERNIA REPAIR  5638   umbilical hernia repair  . KNEE SURGERY     left knee surgery  . TONSILLECTOMY  1959  . VASECTOMY      No Known Allergies  Social History   Social History  . Marital status: Married    Spouse name: N/A  . Number of children: N/A  . Years of education: N/A   Occupational History  . Not on file.   Social History Main Topics  . Smoking status: Never Smoker  . Smokeless tobacco: Never Used  . Alcohol use No  . Drug use: No  . Sexual activity: Not on file   Other  Topics Concern  . Not on file   Social History Narrative   Occupation:  Chemical engineer   Married  -  1 daughter (age 57)    Never Smoked   Alcohol use-no     Drug use-no           Family History  Problem Relation Age of Onset  . Breast cancer Mother   . Hypertension Mother   . Hyperlipidemia Father   . Kidney disease Father   . Heart disease Father   . Hypertension Father   . Colon cancer Neg Hx   . Stomach cancer Neg Hx     BP (!) 184/84   Pulse 85   Ht 5\' 11"  (1.803 m)   Wt 205 lb (93 kg)   BMI 28.59 kg/m   Review of Systems: See HPI above.     Objective:  Physical Exam:  Gen: NAD, comfortable in exam room  Left elbow: Swelling within olecranon bursa.  No bruising, erythema, warmth, other deformity. Minimal TTP over olecranon bursa.  No other tenderness about elbow. FROM without pain. Collateral ligaments intact. NVI distally.  Right elbow: FROM without pain or swelling.   Assessment & Plan:  1. Left olecranon bursitis - No evidence infection.  Ultrasound confirmed bursitis with anechoic fluid, no surrounding neovascularity.  Encouraged continued icing, diclofenac, compression, elbow pad to protect this.  Discussed red flags of fever, redness, streaks to call us or seek care.  He opted for aspiration and injection.  f/u in 1 month.  After informed written consent patient was seated in chair in exam room.  Area overlying left olecranon bursa was prepped with alcohol swab.  3 mL of bupivicaine was used for local anesthesia.  Then using an 18g needle on 60cc syringe, 18 mL of free-flowing serosanguinous fluid was aspirated from left olecranon bursa.  Bursa was then injected with 57mL depomedrol.  Patient tolerated procedure well without immediate complications

## 2016-11-19 NOTE — Assessment & Plan Note (Signed)
No evidence infection.  Ultrasound confirmed bursitis with anechoic fluid, no surrounding neovascularity.  Encouraged continued icing, diclofenac, compression, elbow pad to protect this.  Discussed red flags of fever, redness, streaks to call us or seek care.  He opted for aspiration and injection.  f/u in 1 month.  After informed written consent patient was seated in chair in exam room.  Area overlying left olecranon bursa was prepped with alcohol swab.  3 mL of bupivicaine was used for local anesthesia.  Then using an 18g needle on 60cc syringe, 18 mL of free-flowing serosanguinous fluid was aspirated from left olecranon bursa.  Bursa was then injected with 38mL depomedrol.  Patient tolerated procedure well without immediate complications

## 2016-12-30 ENCOUNTER — Telehealth: Payer: Self-pay | Admitting: Family Medicine

## 2016-12-30 NOTE — Telephone Encounter (Signed)
Relation to VY:XAJL Call back [phone]   Reason for call:  Patient requesting pre vist labs at the [facility] location, patient physical is scheduled for 01/10/17. Please advise

## 2016-12-30 NOTE — Telephone Encounter (Signed)
Future orders have already been placed since 09/02/16 they expire 08/2017.   PC

## 2017-01-03 ENCOUNTER — Other Ambulatory Visit: Payer: Self-pay

## 2017-01-03 ENCOUNTER — Other Ambulatory Visit (INDEPENDENT_AMBULATORY_CARE_PROVIDER_SITE_OTHER): Payer: Medicare Other

## 2017-01-03 DIAGNOSIS — I10 Essential (primary) hypertension: Secondary | ICD-10-CM | POA: Diagnosis not present

## 2017-01-03 DIAGNOSIS — E1169 Type 2 diabetes mellitus with other specified complication: Secondary | ICD-10-CM

## 2017-01-03 DIAGNOSIS — D649 Anemia, unspecified: Secondary | ICD-10-CM | POA: Diagnosis not present

## 2017-01-03 DIAGNOSIS — M1990 Unspecified osteoarthritis, unspecified site: Secondary | ICD-10-CM

## 2017-01-03 DIAGNOSIS — E669 Obesity, unspecified: Secondary | ICD-10-CM

## 2017-01-03 DIAGNOSIS — E782 Mixed hyperlipidemia: Secondary | ICD-10-CM

## 2017-01-03 DIAGNOSIS — K219 Gastro-esophageal reflux disease without esophagitis: Secondary | ICD-10-CM | POA: Diagnosis not present

## 2017-01-03 DIAGNOSIS — R251 Tremor, unspecified: Secondary | ICD-10-CM

## 2017-01-03 DIAGNOSIS — G2581 Restless legs syndrome: Secondary | ICD-10-CM

## 2017-01-03 DIAGNOSIS — Z0000 Encounter for general adult medical examination without abnormal findings: Secondary | ICD-10-CM

## 2017-01-03 DIAGNOSIS — G4733 Obstructive sleep apnea (adult) (pediatric): Secondary | ICD-10-CM | POA: Diagnosis not present

## 2017-01-03 LAB — LIPID PANEL
CHOL/HDL RATIO: 5
Cholesterol: 116 mg/dL (ref 0–200)
HDL: 24.6 mg/dL — AB (ref >39.00)
NonHDL: 91.04
Triglycerides: 234 mg/dL — ABNORMAL HIGH (ref 0.0–149.0)
VLDL: 46.8 mg/dL — AB (ref 0.0–40.0)

## 2017-01-03 LAB — LDL CHOLESTEROL, DIRECT: Direct LDL: 70 mg/dL

## 2017-01-03 LAB — COMPREHENSIVE METABOLIC PANEL
ALK PHOS: 71 U/L (ref 39–117)
ALT: 24 U/L (ref 0–53)
AST: 19 U/L (ref 0–37)
Albumin: 4.1 g/dL (ref 3.5–5.2)
BILIRUBIN TOTAL: 0.8 mg/dL (ref 0.2–1.2)
BUN: 17 mg/dL (ref 6–23)
CALCIUM: 9 mg/dL (ref 8.4–10.5)
CO2: 30 meq/L (ref 19–32)
CREATININE: 1.26 mg/dL (ref 0.40–1.50)
Chloride: 101 mEq/L (ref 96–112)
GFR: 61.03 mL/min (ref >60.00)
GLUCOSE: 315 mg/dL — AB (ref 70–99)
Potassium: 3.7 mEq/L (ref 3.5–5.1)
Sodium: 137 mEq/L (ref 135–145)
TOTAL PROTEIN: 6.8 g/dL (ref 6.0–8.3)

## 2017-01-03 LAB — CBC
HCT: 41.7 % (ref 39.0–52.0)
HEMOGLOBIN: 15 g/dL (ref 13.0–17.0)
MCHC: 36 g/dL (ref 30.0–36.0)
MCV: 83.1 fl (ref 78.0–100.0)
PLATELETS: 285 10*3/uL (ref 150.0–400.0)
RBC: 5.02 Mil/uL (ref 4.22–5.81)
RDW: 14.9 % (ref 11.5–15.5)
WBC: 6.4 10*3/uL (ref 4.0–10.5)

## 2017-01-03 LAB — HEMOGLOBIN A1C: Hgb A1c MFr Bld: 10.8 % — ABNORMAL HIGH (ref 4.6–6.5)

## 2017-01-03 LAB — TSH: TSH: 1.89 u[IU]/mL (ref 0.35–4.50)

## 2017-01-10 ENCOUNTER — Ambulatory Visit (INDEPENDENT_AMBULATORY_CARE_PROVIDER_SITE_OTHER): Payer: Medicare Other | Admitting: Family Medicine

## 2017-01-10 ENCOUNTER — Encounter: Payer: Self-pay | Admitting: Family Medicine

## 2017-01-10 VITALS — BP 128/62 | HR 74 | Temp 98.2°F | Resp 18 | Ht 71.0 in | Wt 207.8 lb

## 2017-01-10 DIAGNOSIS — E1169 Type 2 diabetes mellitus with other specified complication: Secondary | ICD-10-CM | POA: Diagnosis not present

## 2017-01-10 DIAGNOSIS — M1A9XX0 Chronic gout, unspecified, without tophus (tophi): Secondary | ICD-10-CM

## 2017-01-10 DIAGNOSIS — I10 Essential (primary) hypertension: Secondary | ICD-10-CM | POA: Diagnosis not present

## 2017-01-10 DIAGNOSIS — G629 Polyneuropathy, unspecified: Secondary | ICD-10-CM

## 2017-01-10 DIAGNOSIS — E669 Obesity, unspecified: Secondary | ICD-10-CM

## 2017-01-10 DIAGNOSIS — L578 Other skin changes due to chronic exposure to nonionizing radiation: Secondary | ICD-10-CM | POA: Diagnosis not present

## 2017-01-10 DIAGNOSIS — E782 Mixed hyperlipidemia: Secondary | ICD-10-CM

## 2017-01-10 DIAGNOSIS — T7840XD Allergy, unspecified, subsequent encounter: Secondary | ICD-10-CM

## 2017-01-10 DIAGNOSIS — M7022 Olecranon bursitis, left elbow: Secondary | ICD-10-CM

## 2017-01-10 DIAGNOSIS — Z0000 Encounter for general adult medical examination without abnormal findings: Secondary | ICD-10-CM

## 2017-01-10 DIAGNOSIS — Z0001 Encounter for general adult medical examination with abnormal findings: Secondary | ICD-10-CM

## 2017-01-10 MED ORDER — LISINOPRIL 40 MG PO TABS: 40.0000 mg | ORAL_TABLET | Freq: Every day | ORAL | 1 refills | Status: DC

## 2017-01-10 MED ORDER — SIMVASTATIN 10 MG PO TABS: 10.0000 mg | ORAL_TABLET | Freq: Every day | ORAL | 1 refills | Status: DC

## 2017-01-10 MED ORDER — AMLODIPINE BESYLATE 10 MG PO TABS: ORAL_TABLET | ORAL | 1 refills | Status: DC

## 2017-01-10 MED ORDER — METFORMIN HCL 500 MG PO TABS: 1000.0000 mg | ORAL_TABLET | Freq: Two times a day (BID) | ORAL | 1 refills | Status: DC

## 2017-01-10 MED ORDER — METFORMIN HCL 500 MG PO TABS: ORAL_TABLET | ORAL | 1 refills | Status: DC

## 2017-01-10 NOTE — Assessment & Plan Note (Signed)
No recent flare encouraged adequate hydration.

## 2017-01-10 NOTE — Assessment & Plan Note (Signed)
Encouraged heart healthy diet, increase exercise, avoid trans fats, consider a krill oil cap daily. Tolerating Simvastatin.  

## 2017-01-10 NOTE — Assessment & Plan Note (Addendum)
Well controlled, no changes to meds. Encouraged heart healthy diet such as the DASH diet and exercise as tolerated. . Last month his pressure was up but he was not sleeping well and he was taking Diclofenac for a painful elbow.

## 2017-01-10 NOTE — Assessment & Plan Note (Signed)
Was seen by Dr Barbaraann Barthel and it required draining and medication but has now resolved

## 2017-01-10 NOTE — Progress Notes (Signed)
Subjective:  I acted as a Education administrator for Dr. Charlett Blake. [place], [place]  Patient ID: Evan Best, male    DOB: Apr 29, 1951, 66 y.o.   MRN: [id number]  No chief complaint on file.   HPI  Patient is in today for an annual exam and follow up on diabetes, hypertension, hyperlipidemia, obesity and more. He has recently had trouble with olecranon bursitis of left elbow. He was managed by sports medicine and after having elbow drained it finally improved. No other recent illness or acute complaints. He acknowledges he has not been eating well or exercising. He has not been sleeping well. He reports his peripheral neuropathy has improved some. Denies CP/palp/SOB/HA/congestion/fevers/GI or GU c/o. Taking meds as prescribed  Patient Care Team: [name], MD as PCP - General (Family Medicine) Clent Jacks, MD as Consulting Physician (Ophthalmology)   Past Medical History:  Diagnosis Date  . Allergic state 09/22/2013  . Anemia 09/07/2015  . Arthritis 03/06/2015  . Diabetes mellitus type II 1/09  . Esophageal reflux 03/06/2015  . Gout 09/18/2012   Left big toe  . Hypertension   . Insomnia 09/13/2015  . Migraine headache    remote history  . OSA (obstructive sleep apnea)   . Preventative health care 09/05/2014  . Right shoulder pain 02/19/2009   Qualifier: Diagnosis of  By: Wynona Luna   . RLS (restless legs syndrome) 09/07/2015  . Tremor 09/07/2015    Past Surgical History:  Procedure Laterality Date  . HERNIA REPAIR  7001   umbilical hernia repair  . KNEE SURGERY     left knee surgery  . TONSILLECTOMY  1959  . VASECTOMY      Family History  Problem Relation Age of Onset  . Breast cancer Mother   . Hypertension Mother   . Hyperlipidemia Father   . Kidney disease Father   . Heart disease Father   . Hypertension Father   . Colon cancer Neg Hx   . Stomach cancer Neg Hx     Social History   Social History  . Marital status: Married    Spouse name: N/A  . Number of  children: N/A  . Years of education: N/A   Occupational History  . Not on file.   Social History Main Topics  . Smoking status: Never Smoker  . Smokeless tobacco: Never Used  . Alcohol use No  . Drug use: No  . Sexual activity: Not on file   Other Topics Concern  . Not on file   Social History Narrative   Occupation:  Chemical engineer   Married  -  1 daughter (age 36)    Never Smoked   Alcohol use-no     Drug use-no           Outpatient Medications Prior to Visit  Medication Sig Dispense Refill  . Calcium-Magnesium-Zinc 1000-400-15 MG TABS Take by mouth daily.      . cetirizine (ZYRTEC) 10 MG tablet Take 10 mg by mouth daily.    . Chelated Potassium 99 MG TABS Take 99 mg by mouth 2 (two) times daily.     . Cinnamon 500 MG capsule Take 1,500 mg by mouth 2 (two) times daily. +50 mg Chromium in each supplement.    . Coenzyme Q10 (CO Q-10) 100 MG CAPS Take 200 mg by mouth daily.     . Cyanocobalamin (VITAMIN B 12) 250 MCG LOZG Take 250 mcg by mouth 2 (two) times a week.      Marland Kitchen  diclofenac (CATAFLAM) 50 MG tablet Take 1 tablet (50 mg total) by mouth 3 (three) times daily. 14 tablet 0  . fluticasone (FLONASE) 50 MCG/ACT nasal spray Place 2 sprays into both nostrils daily. 16 g 6  . Omega-3 Fatty Acids (FISH OIL PO) Take 1,500 mg by mouth daily.      Marland Kitchen OVER THE COUNTER MEDICATION Phosphatidyl choline- 1 capsule bid    . Quercetin 250 MG TABS Take by mouth 2 (two) times daily.     . vitamin C (ASCORBIC ACID) 500 MG tablet Take 500 mg by mouth 2 (two) times daily.      Marland Kitchen amLODipine (NORVASC) 10 MG tablet TAKE ONE TABLET BY MOUTH ONCE DAILY (Patient taking differently: TAKE ONE TABLET three times a day) 90 tablet 0  . lisinopril (PRINIVIL,ZESTRIL) 40 MG tablet TAKE ONE TABLET BY MOUTH ONCE DAILY 90 tablet 1  . metFORMIN (GLUCOPHAGE) 500 MG tablet TAKE ONE TABLET BY MOUTH ONCE DAILY WITH BREAKFAST 90 tablet 1  . simvastatin (ZOCOR) 10 MG tablet Take 1 tablet (10 mg total) by mouth  at bedtime. 90 tablet 0  . cephALEXin (KEFLEX) 500 MG capsule Take 1 capsule (500 mg total) by mouth 2 (two) times daily. 20 capsule 0  . Glucosamine-Chondroit-Vit C-Mn (GLUCOSAMINE CHONDR 1500 COMPLX) CAPS Take by mouth daily. 1500 mg Glucosamine + 1200 mg Chondroitin QD.    Marland Kitchen glucose blood (ONE TOUCH TEST STRIPS) test strip Use as instructed to check blood sugar up to three times per day     . ONE TOUCH LANCETS MISC Use as instructed to check blood sugar for up to three times per day     . pneumococcal 13-valent conjugate vaccine (PREVNAR 13) SUSP injection Inject 0.5 mLs into the muscle once. 1 Syringe 0  . zoster vaccine live, PF, (ZOSTAVAX) 95638 UNT/0.65ML injection Inject 19,400 Units into the skin once. 1 each 0   No facility-administered medications prior to visit.     No Known Allergies  Review of Systems  Constitutional: Positive for malaise/fatigue. Negative for chills and fever.  HENT: Negative for congestion and hearing loss.   Eyes: Negative for discharge.  Respiratory: Negative for cough, sputum production and shortness of breath.   Cardiovascular: Negative for chest pain, palpitations and leg swelling.  Gastrointestinal: Negative for abdominal pain, blood in stool, constipation, diarrhea, heartburn, nausea and vomiting.  Genitourinary: Negative for dysuria, frequency, hematuria and urgency.  Musculoskeletal: Positive for joint pain. Negative for back pain, falls and myalgias.  Skin: Negative for rash.  Neurological: Negative for dizziness, sensory change, loss of consciousness, weakness and headaches.  Endo/Heme/Allergies: Negative for environmental allergies. Does not bruise/bleed easily.  Psychiatric/Behavioral: Negative for depression and suicidal ideas. The patient has insomnia. The patient is not nervous/anxious.        Objective:    Physical Exam  Constitutional: He is oriented to person, place, and time. He appears well-developed and well-nourished. No  distress.  HENT:  Head: Normocephalic and atraumatic.  Eyes: Conjunctivae are normal.  Neck: Neck supple. No thyromegaly present.  Cardiovascular: Normal rate, regular rhythm and normal heart sounds.   No murmur heard. Pulmonary/Chest: Effort normal and breath sounds normal. No respiratory distress. He has no wheezes.  Abdominal: Soft. Bowel sounds are normal. He exhibits no mass. There is no tenderness.  Musculoskeletal: He exhibits no edema.  Lymphadenopathy:    He has no cervical adenopathy.  Neurological: He is alert and oriented to person, place, and time.  Skin: Skin is warm and  dry.  Psychiatric: He has a normal mood and affect. His behavior is normal.    BP 128/62 (BP Location: Left Arm, Patient Position: Sitting, Cuff Size: Normal)   Pulse 74   Temp 98.2 F (36.8 C) (Oral)   Resp 18   Ht 5\' 11"  (1.803 m)   Wt 207 lb 12.8 oz (94.3 kg)   SpO2 98%   BMI 28.98 kg/m  Wt Readings from Last 3 Encounters:  01/10/17 207 lb 12.8 oz (94.3 kg)  11/16/16 205 lb (93 kg)  11/09/16 207 lb (93.9 kg)   BP Readings from Last 3 Encounters:  01/10/17 128/62  11/16/16 (!) 184/84  11/09/16 (!) 155/85     Immunization History  Administered Date(s) Administered  . Influenza Split 05/13/2011, 05/11/2012  . Influenza Whole 05/13/2009, 05/14/2010, 04/10/2013  . Influenza-Unspecified 04/11/2014, 04/17/2015  . Tdap 06/21/2011    Health Maintenance  Topic Date Due  . HIV Screening  12/07/1966  . FOOT EXAM  03/05/2016  . COLONOSCOPY  07/26/2016  . PNA vac Low Risk Adult (1 of 2 - PCV13) 12/06/2016  . INFLUENZA VACCINE  02/08/2017  . HEMOGLOBIN A1C  07/05/2017  . OPHTHALMOLOGY EXAM  10/11/2017  . TETANUS/TDAP  06/20/2021  . Hepatitis C Screening  Completed    Lab Results  Component Value Date   WBC 6.4 01/03/2017   HGB 15.0 01/03/2017   HCT 41.7 01/03/2017   PLT 285.0 01/03/2017   GLUCOSE 315 (H) 01/03/2017   CHOL 116 01/03/2017   TRIG 234.0 (H) 01/03/2017   HDL 24.60 (L)  01/03/2017   LDLDIRECT 70.0 01/03/2017   LDLCALC 70 09/01/2015   ALT 24 01/03/2017   AST 19 01/03/2017   NA 137 01/03/2017   K 3.7 01/03/2017   CL 101 01/03/2017   CREATININE 1.26 01/03/2017   BUN 17 01/03/2017   CO2 30 01/03/2017   TSH 1.89 01/03/2017   PSA 0.53 06/17/2013   HGBA1C 10.8 Repeated and verified X2. (H) 01/03/2017   MICROALBUR 1.66 03/15/2013    Lab Results  Component Value Date   TSH 1.89 01/03/2017   Lab Results  Component Value Date   WBC 6.4 01/03/2017   HGB 15.0 01/03/2017   HCT 41.7 01/03/2017   MCV 83.1 01/03/2017   PLT 285.0 01/03/2017   Lab Results  Component Value Date   NA 137 01/03/2017   K 3.7 01/03/2017   CO2 30 01/03/2017   GLUCOSE 315 (H) 01/03/2017   BUN 17 01/03/2017   CREATININE 1.26 01/03/2017   BILITOT 0.8 01/03/2017   ALKPHOS 71 01/03/2017   AST 19 01/03/2017   ALT 24 01/03/2017   PROT 6.8 01/03/2017   ALBUMIN 4.1 01/03/2017   CALCIUM 9.0 01/03/2017   GFR 61.03 01/03/2017   Lab Results  Component Value Date   CHOL 116 01/03/2017   Lab Results  Component Value Date   HDL 24.60 (L) 01/03/2017   Lab Results  Component Value Date   LDLCALC 70 09/01/2015   Lab Results  Component Value Date   TRIG 234.0 (H) 01/03/2017   Lab Results  Component Value Date   CHOLHDL 5 01/03/2017   Lab Results  Component Value Date   HGBA1C 10.8 Repeated and verified X2. (H) 01/03/2017         Assessment & Plan:   Problem List Items Addressed This Visit    Hyperlipidemia, mixed    Encouraged heart healthy diet, increase exercise, avoid trans fats, consider a krill oil cap daily. Tolerating Simvastatin  Relevant Medications   lisinopril (PRINIVIL,ZESTRIL) 40 MG tablet   amLODipine (NORVASC) 10 MG tablet   simvastatin (ZOCOR) 10 MG tablet   Other Relevant Orders   Lipid panel   Essential hypertension    Well controlled, no changes to meds. Encouraged heart healthy diet such as the DASH diet and exercise as tolerated. .  Last month his pressure was up but he was not sleeping well and he was taking Diclofenac for a painful elbow.       Relevant Medications   lisinopril (PRINIVIL,ZESTRIL) 40 MG tablet   amLODipine (NORVASC) 10 MG tablet   simvastatin (ZOCOR) 10 MG tablet   Other Relevant Orders   CBC   Comprehensive metabolic panel   TSH   Diabetes mellitus type 2 in obese (HCC)    hgba1c unacceptable, minimize simple carbs. Increase exercise as tolerated. Continue current meds. Will Metformin 500 mg tabs 2 tabs po bid, and check sugars daily and as needed. Referred to podiatry      Relevant Medications   lisinopril (PRINIVIL,ZESTRIL) 40 MG tablet   simvastatin (ZOCOR) 10 MG tablet   metFORMIN (GLUCOPHAGE) 500 MG tablet   Other Relevant Orders   Hemoglobin A1c   Ambulatory referral to Podiatry   Gout    No recent flare encouraged adequate hydration.      Relevant Orders   Uric acid   Allergic state   Preventative health care    Patient encouraged to maintain heart healthy diet, regular exercise, adequate sleep. Consider daily probiotics. Take medications as prescribed.      Olecranon bursitis of left elbow    Was seen by Dr Barbaraann Barthel and it required draining and medication but has now resolved      Sun-damaged skin - Primary    Referred to dermatology for further consideration      Relevant Orders   Ambulatory referral to Dermatology    Other Visit Diagnoses    Neuropathy       Relevant Orders   Ambulatory referral to Podiatry      I have discontinued Mr. Hanback's glucose blood, ONE TOUCH LANCETS, GLUCOSAMINE CHONDR 1500 COMPLX, zoster vaccine live (PF), pneumococcal 13-valent conjugate vaccine, metFORMIN, and cephALEXin. I have also changed his lisinopril, amLODipine, and metFORMIN. Additionally, I am having him maintain his Vitamin B 12, Calcium-Magnesium-Zinc, Chelated Potassium, Cinnamon, Omega-3 Fatty Acids (FISH OIL PO), vitamin C, Co Q-10, fluticasone, OVER THE COUNTER  MEDICATION, cetirizine, Quercetin, diclofenac, and simvastatin.  Meds ordered this encounter  Medications  . lisinopril (PRINIVIL,ZESTRIL) 40 MG tablet    Sig: Take 1 tablet (40 mg total) by mouth daily.    Dispense:  90 tablet    Refill:  1  . DISCONTD: metFORMIN (GLUCOPHAGE) 500 MG tablet    Sig: TAKE ONE TABLET BY MOUTH ONCE DAILY WITH BREAKFAST    Dispense:  90 tablet    Refill:  1  . amLODipine (NORVASC) 10 MG tablet    Sig: Take one tablet by mouth daily    Dispense:  90 tablet    Refill:  1  . simvastatin (ZOCOR) 10 MG tablet    Sig: Take 1 tablet (10 mg total) by mouth at bedtime.    Dispense:  90 tablet    Refill:  1    Please consider 90 day supplies to promote better adherence  . metFORMIN (GLUCOPHAGE) 500 MG tablet    Sig: Take 2 tablets (1,000 mg total) by mouth 2 (two) times daily with a meal.  Dispense:  360 tablet    Refill:  1    CMA served as scribe during this visit. History, Physical and Plan performed by medical provider. Documentation and orders reviewed and attested to.  Penni Homans, MD

## 2017-01-10 NOTE — Assessment & Plan Note (Addendum)
hgba1c unacceptable, minimize simple carbs. Increase exercise as tolerated. Continue current meds. Will Metformin 500 mg tabs 2 tabs po bid, and check sugars daily and as needed. Referred to podiatry

## 2017-01-10 NOTE — Patient Instructions (Addendum)
If leg cramps recur consider a magnesium supplement, Hyland's leg cramp med and hydrate at least 64 oz of fluids   Shingrix is the new shingles shot it is a series of 2 shots over 6 months. Cheapest at pharmacy  Prevnar (PCV 13) is the second pneumonia shot you can get it at the pharmacy  Preventive Care 65 Years and Older, Male Preventive care refers to lifestyle choices and visits with your health care provider that can promote health and wellness. What does preventive care include?  A yearly physical exam. This is also called an annual well check.  Dental exams once or twice a year.  Routine eye exams. Ask your health care provider how often you should have your eyes checked.  Personal lifestyle choices, including: ? Daily care of your teeth and gums. ? Regular physical activity. ? Eating a healthy diet. ? Avoiding tobacco and drug use. ? Limiting alcohol use. ? Practicing safe sex. ? Taking low doses of aspirin every day. ? Taking vitamin and mineral supplements as recommended by your health care provider. What happens during an annual well check? The services and screenings done by your health care provider during your annual well check will depend on your age, overall health, lifestyle risk factors, and family history of disease. Counseling Your health care provider may ask you questions about your:  Alcohol use.  Tobacco use.  Drug use.  Emotional well-being.  Home and relationship well-being.  Sexual activity.  Eating habits.  History of falls.  Memory and ability to understand (cognition).  Work and work Statistician.  Screening You may have the following tests or measurements:  Height, weight, and BMI.  Blood pressure.  Lipid and cholesterol levels. These may be checked every 5 years, or more frequently if you are over 45 years old.  Skin check.  Lung cancer screening. You may have this screening every year starting at age 49 if you have a  30-pack-year history of smoking and currently smoke or have quit within the past 15 years.  Fecal occult blood test (FOBT) of the stool. You may have this test every year starting at age 53.  Flexible sigmoidoscopy or colonoscopy. You may have a sigmoidoscopy every 5 years or a colonoscopy every 10 years starting at age 66.  Prostate cancer screening. Recommendations will vary depending on your family history and other risks.  Hepatitis C blood test.  Hepatitis B blood test.  Sexually transmitted disease (STD) testing.  Diabetes screening. This is done by checking your blood sugar (glucose) after you have not eaten for a while (fasting). You may have this done every 1-3 years.  Abdominal aortic aneurysm (AAA) screening. You may need this if you are a current or former smoker.  Osteoporosis. You may be screened starting at age 82 if you are at high risk.  Talk with your health care provider about your test results, treatment options, and if necessary, the need for more tests. Vaccines Your health care provider may recommend certain vaccines, such as:  Influenza vaccine. This is recommended every year.  Tetanus, diphtheria, and acellular pertussis (Tdap, Td) vaccine. You may need a Td booster every 10 years.  Varicella vaccine. You may need this if you have not been vaccinated.  Zoster vaccine. You may need this after age 36.  Measles, mumps, and rubella (MMR) vaccine. You may need at least one dose of MMR if you were born in 1957 or later. You may also need a second dose.  Pneumococcal  13-valent conjugate (PCV13) vaccine. One dose is recommended after age 70.  Pneumococcal polysaccharide (PPSV23) vaccine. One dose is recommended after age 96.  Meningococcal vaccine. You may need this if you have certain conditions.  Hepatitis A vaccine. You may need this if you have certain conditions or if you travel or work in places where you may be exposed to hepatitis A.  Hepatitis B  vaccine. You may need this if you have certain conditions or if you travel or work in places where you may be exposed to hepatitis B.  Haemophilus influenzae type b (Hib) vaccine. You may need this if you have certain risk factors.  Talk to your health care provider about which screenings and vaccines you need and how often you need them. This information is not intended to replace advice given to you by your health care provider. Make sure you discuss any questions you have with your health care provider. Document Released: 07/24/2015 Document Revised: 03/16/2016 Document Reviewed: 04/28/2015 Elsevier Interactive Patient Education  2017 [employer].

## 2017-01-10 NOTE — Assessment & Plan Note (Signed)
Patient encouraged to maintain heart healthy diet, regular exercise, adequate sleep. Consider daily probiotics. Take medications as prescribed 

## 2017-01-11 DIAGNOSIS — L578 Other skin changes due to chronic exposure to nonionizing radiation: Secondary | ICD-10-CM | POA: Insufficient documentation

## 2017-01-11 NOTE — Assessment & Plan Note (Signed)
Referred to dermatology for further consideration.  

## 2017-01-17 ENCOUNTER — Telehealth: Payer: Self-pay | Admitting: *Deleted

## 2017-01-17 ENCOUNTER — Telehealth: Payer: Self-pay | Admitting: Family Medicine

## 2017-01-17 NOTE — Telephone Encounter (Signed)
I need [facility] to interrogate the machine and send [place] a report I can review to justify the new machine

## 2017-01-17 NOTE — Telephone Encounter (Signed)
Received Physician Orders from [facility], forwarded to provider/SLS 07/10

## 2017-01-17 NOTE — Telephone Encounter (Signed)
Pt says that [facility] will be sending over a rx for a new C-Pap machine. He would like to make provider aware to be looking out for it.

## 2017-01-19 NOTE — Telephone Encounter (Signed)
Called patient to clarify who exactly he has his CPap machine with. I called [facility] they stated they do not have a record of him having one with them only receiving supplies form them.   PC

## 2017-01-20 NOTE — Telephone Encounter (Signed)
Called patient again today wife states he was not home and he would call back later.   Pc

## 2017-01-23 ENCOUNTER — Telehealth: Payer: Self-pay | Admitting: Family Medicine

## 2017-01-23 NOTE — Telephone Encounter (Signed)
Spoke with patient about his cpap machine. Patient has not had sleep study in over ten years. Patient has agreed to come in to meet with PCP.   PC

## 2017-01-23 NOTE — Telephone Encounter (Signed)
Pt called in, he said that he's returning a call. Pt says that he need a new Rx for his C-Pap machine for [facility] .Marland Kitchen   If any questions - [phone]

## 2017-02-03 ENCOUNTER — Ambulatory Visit (INDEPENDENT_AMBULATORY_CARE_PROVIDER_SITE_OTHER): Payer: Medicare Other | Admitting: Podiatry

## 2017-02-03 ENCOUNTER — Encounter: Payer: Self-pay | Admitting: Podiatry

## 2017-02-03 VITALS — BP 188/104 | HR 78

## 2017-02-03 DIAGNOSIS — B351 Tinea unguium: Secondary | ICD-10-CM

## 2017-02-03 DIAGNOSIS — M1000 Idiopathic gout, unspecified site: Secondary | ICD-10-CM | POA: Diagnosis not present

## 2017-02-03 NOTE — Patient Instructions (Signed)

## 2017-02-03 NOTE — Progress Notes (Signed)
   Subjective:    Patient ID: Evan Best, male    DOB: 1950/11/30, 66 y.o.   MRN: [id number]  HPI  Chief Complaint  Patient presents with  . Nail Problem    Diabetic foot care  . Peripheral Neuropathy    B/L       Review of Systems  Musculoskeletal: Positive for arthralgias and myalgias.       Objective:   Physical Exam        Assessment & Plan:

## 2017-02-05 NOTE — Progress Notes (Signed)
Subjective:    Patient ID: Evan Best, male   DOB: 66 y.o.   MRN: [id number]   HPI patient presents with significant nail disease 1-5 both feet and also complains of gout-like symptomatology left with history of attacks    Review of Systems  All other systems reviewed and are negative.       Objective:  Physical Exam  Cardiovascular: Intact distal pulses.   Musculoskeletal: Normal range of motion.  Neurological: He is alert.  Skin: Skin is warm and dry.  Nursing note and vitals reviewed.  neurovascular status was found to be intact with muscle strength adequate. Patient's found to have mild reduction range of motion subtalar midtarsal joint and some redness around the first MPJ left with history of gout attack of several duration times. Patient's noted to have thickened nails 1-5 both feet that are currently nonpainful and he does have obesity and diabetes under control which are complicating factors     Assessment:    Long-term diabetic with history of gout and mycotic thick nails that he cannot cut adequate     Plan:   H&P condition reviewed and at this point diabetic education rendered. I gave him information on gout and foods to avoid and also discussed his high blood pressure and I want him to check this at home and see his doctor if it continues to be elevated. I debrided nailbeds 1-5 both feet with no iatrogenic bleeding noted

## 2017-02-13 ENCOUNTER — Other Ambulatory Visit: Payer: Self-pay | Admitting: Family Medicine

## 2017-02-14 ENCOUNTER — Ambulatory Visit (INDEPENDENT_AMBULATORY_CARE_PROVIDER_SITE_OTHER): Payer: Medicare Other | Admitting: Family Medicine

## 2017-02-14 ENCOUNTER — Encounter: Payer: Self-pay | Admitting: Family Medicine

## 2017-02-14 VITALS — BP 126/68 | HR 72 | Temp 97.6°F | Resp 18 | Wt 209.6 lb

## 2017-02-14 DIAGNOSIS — G4733 Obstructive sleep apnea (adult) (pediatric): Secondary | ICD-10-CM | POA: Diagnosis not present

## 2017-02-14 DIAGNOSIS — G4730 Sleep apnea, unspecified: Secondary | ICD-10-CM | POA: Diagnosis not present

## 2017-02-14 DIAGNOSIS — M7022 Olecranon bursitis, left elbow: Secondary | ICD-10-CM | POA: Diagnosis not present

## 2017-02-14 DIAGNOSIS — I10 Essential (primary) hypertension: Secondary | ICD-10-CM

## 2017-02-14 MED ORDER — SIMVASTATIN 10 MG PO TABS: 10.0000 mg | ORAL_TABLET | Freq: Every day | ORAL | 1 refills | Status: DC

## 2017-02-14 NOTE — Patient Instructions (Signed)
Try ice and topical aspirin and lidocaine as needed while waiting for sports med appointment  Elbow Bursitis A bursa is a fluid-filled sac that covers and protects a joint. Bursitis is when the fluid-filled sac gets puffy and sore (inflamed). Elbow bursitis, also called olecranon bursitis, happens over your elbow. This may be caused by:  Injury (acute trauma) to your elbow.  Leaning on hard surfaces for long periods of time.  Infection from an injury that breaks the skin near your elbow.  A bone growth (spur) that forms at the tip of your elbow.  A medical condition that causes inflammation in your body, such as: ? Gout. ? Rheumatoid arthritis.  Sometimes the cause is not known. Follow these instructions at home:  Take medicines only as told by your doctor.  If you were prescribed an antibiotic medicine, finish all of it even if you start to feel better.  If your bursitis is caused by an injury, rest your elbow and wear your bandage as told by your doctor. You may also apply ice to the injured area as told by your doctor: ? Put ice in a plastic bag. ? Place a towel between your skin and the bag. ? Leave the ice on for 20 minutes, 2-3 times per day.  Do not do any activities that cause pain to your elbow.  Use elbow pads or wraps to cushion your elbow. Contact a doctor if:  You have a fever.  Your symptoms do not get better with treatment.  Your pain or swelling gets worse.  Your pain or swelling goes away and then comes back.  You have drainage of pus from the swollen area over your elbow. This information is not intended to replace advice given to you by your health care provider. Make sure you discuss any questions you have with your health care provider. Document Released: 12/15/2009 Document Revised: 12/03/2015 Document Reviewed: 03/05/2014 Elsevier Interactive Patient Education  [employer].

## 2017-02-14 NOTE — Assessment & Plan Note (Signed)
Well controlled, no changes to meds. Encouraged heart healthy diet such as the DASH diet and exercise as tolerated.  °

## 2017-02-14 NOTE — Progress Notes (Signed)
Subjective:  I acted as a Education administrator for Dr. Charlett Blake. [place], [place]  Patient ID: Evan Best, male    DOB: Aug 13, 1950, 66 y.o.   MRN: [id number]  No chief complaint on file.   HPI  Patient is in today for an acute visit following up on his CPaP machine sleep study. He also c/o left elbow bursitis. No recent febrile illness or acute hospitalizations. Denies CP/palp/SOB/HA/congestion/fevers/GI or GU c/o. Taking meds as prescribed. He reports using his CPAP nightly and resting well with it.    Patient Care Team: [name], MD as PCP - General (Family Medicine) Clent Jacks, MD as Consulting Physician (Ophthalmology)   Past Medical History:  Diagnosis Date  . Allergic state 09/22/2013  . Anemia 09/07/2015  . Arthritis 03/06/2015  . Diabetes mellitus type II 1/09  . Esophageal reflux 03/06/2015  . Gout 09/18/2012   Left big toe  . Hypertension   . Insomnia 09/13/2015  . Migraine headache    remote history  . OSA (obstructive sleep apnea)   . Preventative health care 09/05/2014  . Right shoulder pain 02/19/2009   Qualifier: Diagnosis of  By: Wynona Luna   . RLS (restless legs syndrome) 09/07/2015  . Tremor 09/07/2015    Past Surgical History:  Procedure Laterality Date  . HERNIA REPAIR  7829   umbilical hernia repair  . KNEE SURGERY     left knee surgery  . TONSILLECTOMY  1959  . VASECTOMY      Family History  Problem Relation Age of Onset  . Breast cancer Mother   . Hypertension Mother   . Hyperlipidemia Father   . Kidney disease Father   . Heart disease Father   . Hypertension Father   . Colon cancer Neg Hx   . Stomach cancer Neg Hx     Social History   Social History  . Marital status: Married    Spouse name: N/A  . Number of children: N/A  . Years of education: N/A   Occupational History  . Not on file.   Social History Main Topics  . Smoking status: Never Smoker  . Smokeless tobacco: Never Used  . Alcohol use No  . Drug use: No  . Sexual  activity: Not on file   Other Topics Concern  . Not on file   Social History Narrative   Occupation:  Chemical engineer   Married  -  1 daughter (age 54)    Never Smoked   Alcohol use-no     Drug use-no           Outpatient Medications Prior to Visit  Medication Sig Dispense Refill  . amLODipine (NORVASC) 10 MG tablet Take one tablet by mouth daily 90 tablet 1  . Calcium-Magnesium-Zinc 1000-400-15 MG TABS Take by mouth daily.      . cetirizine (ZYRTEC) 10 MG tablet Take 10 mg by mouth daily.    . Chelated Potassium 99 MG TABS Take 99 mg by mouth 2 (two) times daily.     . Cinnamon 500 MG capsule Take 1,500 mg by mouth 3 (three) times daily. +50 mg Chromium in each supplement.    . Coenzyme Q10 (CO Q-10) 100 MG CAPS Take 200 mg by mouth 3 (three) times daily.     . Cyanocobalamin (VITAMIN B 12) 250 MCG LOZG Take 250 mcg by mouth 2 (two) times a week.      . fluticasone (FLONASE) 50 MCG/ACT nasal spray Place 2 sprays into  both nostrils daily. 16 g 6  . lisinopril (PRINIVIL,ZESTRIL) 40 MG tablet Take 1 tablet (40 mg total) by mouth daily. 90 tablet 1  . metFORMIN (GLUCOPHAGE) 500 MG tablet Take 2 tablets (1,000 mg total) by mouth 2 (two) times daily with a meal. 360 tablet 1  . Omega-3 Fatty Acids (FISH OIL PO) Take 1,500 mg by mouth daily.      Marland Kitchen OVER THE COUNTER MEDICATION Phosphatidyl choline- 1 capsule bid    . Quercetin 250 MG TABS Take by mouth 2 (two) times daily.     . vitamin C (ASCORBIC ACID) 500 MG tablet Take 500 mg by mouth 2 (two) times daily.      . simvastatin (ZOCOR) 10 MG tablet Take 1 tablet (10 mg total) by mouth at bedtime. 90 tablet 1  . simvastatin (ZOCOR) 10 MG tablet TAKE 1 TABLET BY MOUTH EVERYDAY AT BEDTIME 90 tablet 0   No facility-administered medications prior to visit.     No Known Allergies  Review of Systems  Constitutional: Negative for fever and malaise/fatigue.  HENT: Negative for congestion.   Eyes: Negative for blurred vision.    Respiratory: Negative for cough and shortness of breath.   Cardiovascular: Negative for chest pain, palpitations and leg swelling.  Gastrointestinal: Negative for vomiting.  Musculoskeletal: Positive for joint pain. Negative for back pain.  Skin: Negative for rash.  Neurological: Negative for loss of consciousness and headaches.       Objective:    Physical Exam  Constitutional: He is oriented to person, place, and time. He appears well-developed and well-nourished. No distress.  HENT:  Head: Normocephalic and atraumatic.  Eyes: Conjunctivae are normal.  Neck: Normal range of motion. No thyromegaly present.  Cardiovascular: Normal rate and regular rhythm.   Pulmonary/Chest: Effort normal and breath sounds normal. He has no wheezes.  Abdominal: Soft. Bowel sounds are normal. There is no tenderness.  Musculoskeletal: Normal range of motion. He exhibits no edema or deformity.  Neurological: He is alert and oriented to person, place, and time.  Skin: Skin is warm and dry. He is not diaphoretic.  Psychiatric: He has a normal mood and affect.    BP 126/68 (BP Location: Left Arm, Patient Position: Sitting, Cuff Size: Normal)   Pulse 72   Temp 97.6 F (36.4 C) (Oral)   Resp 18   Wt 209 lb 9.6 oz (95.1 kg)   SpO2 97%   BMI 29.23 kg/m  Wt Readings from Last 3 Encounters:  02/14/17 209 lb 9.6 oz (95.1 kg)  01/10/17 207 lb 12.8 oz (94.3 kg)  11/16/16 205 lb (93 kg)   BP Readings from Last 3 Encounters:  02/14/17 126/68  02/03/17 (!) 188/104  01/10/17 128/62     Immunization History  Administered Date(s) Administered  . Influenza Split 05/13/2011, 05/11/2012  . Influenza Whole 05/13/2009, 05/14/2010, 04/10/2013  . Influenza-Unspecified 04/11/2014, 04/17/2015  . Tdap 06/21/2011    Health Maintenance  Topic Date Due  . HIV Screening  12/07/1966  . FOOT EXAM  03/05/2016  . COLONOSCOPY  07/26/2016  . PNA vac Low Risk Adult (1 of 2 - PCV13) 12/06/2016  . INFLUENZA VACCINE   02/08/2017  . HEMOGLOBIN A1C  07/05/2017  . OPHTHALMOLOGY EXAM  10/11/2017  . TETANUS/TDAP  06/20/2021  . Hepatitis C Screening  Completed    Lab Results  Component Value Date   WBC 6.4 01/03/2017   HGB 15.0 01/03/2017   HCT 41.7 01/03/2017   PLT 285.0 01/03/2017  GLUCOSE 315 (H) 01/03/2017   CHOL 116 01/03/2017   TRIG 234.0 (H) 01/03/2017   HDL 24.60 (L) 01/03/2017   LDLDIRECT 70.0 01/03/2017   LDLCALC 70 09/01/2015   ALT 24 01/03/2017   AST 19 01/03/2017   NA 137 01/03/2017   K 3.7 01/03/2017   CL 101 01/03/2017   CREATININE 1.26 01/03/2017   BUN 17 01/03/2017   CO2 30 01/03/2017   TSH 1.89 01/03/2017   PSA 0.53 06/17/2013   HGBA1C 10.8 Repeated and verified X2. (H) 01/03/2017   MICROALBUR 1.66 03/15/2013    Lab Results  Component Value Date   TSH 1.89 01/03/2017   Lab Results  Component Value Date   WBC 6.4 01/03/2017   HGB 15.0 01/03/2017   HCT 41.7 01/03/2017   MCV 83.1 01/03/2017   PLT 285.0 01/03/2017   Lab Results  Component Value Date   NA 137 01/03/2017   K 3.7 01/03/2017   CO2 30 01/03/2017   GLUCOSE 315 (H) 01/03/2017   BUN 17 01/03/2017   CREATININE 1.26 01/03/2017   BILITOT 0.8 01/03/2017   ALKPHOS 71 01/03/2017   AST 19 01/03/2017   ALT 24 01/03/2017   PROT 6.8 01/03/2017   ALBUMIN 4.1 01/03/2017   CALCIUM 9.0 01/03/2017   GFR 61.03 01/03/2017   Lab Results  Component Value Date   CHOL 116 01/03/2017   Lab Results  Component Value Date   HDL 24.60 (L) 01/03/2017   Lab Results  Component Value Date   LDLCALC 70 09/01/2015   Lab Results  Component Value Date   TRIG 234.0 (H) 01/03/2017   Lab Results  Component Value Date   CHOLHDL 5 01/03/2017   Lab Results  Component Value Date   HGBA1C 10.8 Repeated and verified X2. (H) 01/03/2017         Assessment & Plan:   Problem List Items Addressed This Visit    Obstructive sleep apnea    Has used CPAP for a long time but has not had a sleep study in years, he needs new  supplies but he does acknowledge it may also be time for a new machine. Paperwork filled out by supplies and he is referred back to pulmonology for consideration. He uses his CPAP nightly he reports 5-6 hours a night. He reports he rests well with it and wakes up refreshed.       Essential hypertension    Well controlled, no changes to meds. Encouraged heart healthy diet such as the DASH diet and exercise as tolerated.       Relevant Medications   simvastatin (ZOCOR) 10 MG tablet   Olecranon bursitis of left elbow    Has flared recently, has been drained by Dr Barbaraann Barthel previously with good results is referred back for further management. Use topical aspirin and Lidocaine      Relevant Orders   Ambulatory referral to Sports Medicine    Other Visit Diagnoses    Sleep apnea, unspecified type    -  Primary   Relevant Orders   Ambulatory referral to Pulmonology      I am having Mr. Prime maintain his Vitamin B 12, Calcium-Magnesium-Zinc, Chelated Potassium, Cinnamon, Omega-3 Fatty Acids (FISH OIL PO), vitamin C, Co Q-10, fluticasone, OVER THE COUNTER MEDICATION, cetirizine, Quercetin, lisinopril, amLODipine, metFORMIN, and simvastatin.  Meds ordered this encounter  Medications  . DISCONTD: simvastatin (ZOCOR) 10 MG tablet    Sig: Take 1 tablet (10 mg total) by mouth at bedtime.  Dispense:  90 tablet    Refill:  1    Please consider 90 day supplies to promote better adherence  . simvastatin (ZOCOR) 10 MG tablet    Sig: Take 1 tablet (10 mg total) by mouth at bedtime.    Dispense:  90 tablet    Refill:  1    Please consider 90 day supplies to promote better adherence    CMA served as scribe during this visit. History, Physical and Plan performed by medical provider. Documentation and orders reviewed and attested to.  Penni Homans, MD

## 2017-02-14 NOTE — Assessment & Plan Note (Addendum)
Has used CPAP for a long time but has not had a sleep study in years, he needs new supplies but he does acknowledge it may also be time for a new machine. Paperwork filled out by supplies and he is referred back to pulmonology for consideration. He uses his CPAP nightly he reports 5-6 hours a night. He reports he rests well with it and wakes up refreshed.

## 2017-02-14 NOTE — Assessment & Plan Note (Addendum)
Has flared recently, has been drained by Dr Barbaraann Barthel previously with good results is referred back for further management. Use topical aspirin and Lidocaine

## 2017-02-16 ENCOUNTER — Ambulatory Visit (INDEPENDENT_AMBULATORY_CARE_PROVIDER_SITE_OTHER): Payer: Medicare Other | Admitting: Family Medicine

## 2017-02-16 ENCOUNTER — Encounter: Payer: Self-pay | Admitting: Family Medicine

## 2017-02-16 VITALS — BP 146/80 | HR 67 | Ht 71.0 in | Wt 210.0 lb

## 2017-02-16 DIAGNOSIS — M7022 Olecranon bursitis, left elbow: Secondary | ICD-10-CM

## 2017-02-16 NOTE — Patient Instructions (Signed)
You have olecranon bursitis. Ice the area 3-4 times a day for 15 minutes at a time Ibuprofen or aleve only if needed. Compression wrap or sleeve to help keep swelling down. Consider aspiration and injection of the area as well - we went ahead with this today. Elbow pad for protection to prevent irritation and additional swelling. If you notice red streaking from this, fever above 100.4 degrees, call us. If this recurs again call me and I would recommend seeing a surgeon to remove the bursa at that point.

## 2017-02-17 DIAGNOSIS — M7022 Olecranon bursitis, left elbow: Secondary | ICD-10-CM

## 2017-02-17 MED ORDER — METHYLPREDNISOLONE ACETATE 40 MG/ML IJ SUSP
40.0000 mg | Freq: Once | INTRAMUSCULAR | Status: AC
  Administered 2017-02-17: 40 mg via INTRA_ARTICULAR

## 2017-02-20 NOTE — Assessment & Plan Note (Signed)
Recurrent - went ahead with repeat aspiration and injection.  Icing, ibuprofen or aleve, compression.  If recurs again advised would refer to ortho to discuss bursectomy.  After informed written consent patient was seated in chair in exam room.  Area overlying left olecranon bursa was prepped with alcohol swab.  2 mL of bupivicaine was used for local anesthesia.  Then using an 18g needle on 60cc syringe, 15 mL of serosanguinous fluid was aspirated from left olecranon bursa.  Bursa was then injected with 54mL depomedrol.  Patient tolerated procedure well without immediate complications

## 2017-02-20 NOTE — Progress Notes (Signed)
PCP: [name], MD  Subjective:   HPI: Patient is a 66 y.o. male here for left elbow pain/swelling.  5/9: Patient reports he noticed swelling over his left elbow 2 months ago. No acute injury or trauma but he does lean on his left elbow when on the computer. Pain is dull, 1/10 at worst. He reports this was inflamed and swollen - used a topical antibiotic and this cleared up then swelled. Had some redness - given oral diclofenac which helps. Rx an antibiotic as well but hasn't picked this up - instructed to only take if appears infected. No fevers, chills, sweats. Has been icing. No other skin changes, numbness. Right handed.  8/9: Patient returns with recurrence over past week or so of swelling posterior left elbow. Pain is 2/10 and dull. Worse if resting elbow on something. Motion not limited. No other complaints. No skin changes, fevers, redness, numbness.  Past Medical History:  Diagnosis Date  . Allergic state 09/22/2013  . Anemia 09/07/2015  . Arthritis 03/06/2015  . Diabetes mellitus type II 1/09  . Esophageal reflux 03/06/2015  . Gout 09/18/2012   Left big toe  . Hypertension   . Insomnia 09/13/2015  . Migraine headache    remote history  . OSA (obstructive sleep apnea)   . Preventative health care 09/05/2014  . Right shoulder pain 02/19/2009   Qualifier: Diagnosis of  By: Wynona Luna   . RLS (restless legs syndrome) 09/07/2015  . Tremor 09/07/2015    Current Outpatient Prescriptions on File Prior to Visit  Medication Sig Dispense Refill  . amLODipine (NORVASC) 10 MG tablet Take one tablet by mouth daily 90 tablet 1  . Calcium-Magnesium-Zinc 1000-400-15 MG TABS Take by mouth daily.      . cetirizine (ZYRTEC) 10 MG tablet Take 10 mg by mouth daily.    . Chelated Potassium 99 MG TABS Take 99 mg by mouth 2 (two) times daily.     . Cinnamon 500 MG capsule Take 1,500 mg by mouth 3 (three) times daily. +50 mg Chromium in each supplement.    . Coenzyme Q10 (CO  Q-10) 100 MG CAPS Take 200 mg by mouth 3 (three) times daily.     . Cyanocobalamin (VITAMIN B 12) 250 MCG LOZG Take 250 mcg by mouth 2 (two) times a week.      . fluticasone (FLONASE) 50 MCG/ACT nasal spray Place 2 sprays into both nostrils daily. 16 g 6  . lisinopril (PRINIVIL,ZESTRIL) 40 MG tablet Take 1 tablet (40 mg total) by mouth daily. 90 tablet 1  . metFORMIN (GLUCOPHAGE) 500 MG tablet Take 2 tablets (1,000 mg total) by mouth 2 (two) times daily with a meal. 360 tablet 1  . Omega-3 Fatty Acids (FISH OIL PO) Take 1,500 mg by mouth daily.      Marland Kitchen OVER THE COUNTER MEDICATION Phosphatidyl choline- 1 capsule bid    . Quercetin 250 MG TABS Take by mouth 2 (two) times daily.     . simvastatin (ZOCOR) 10 MG tablet Take 1 tablet (10 mg total) by mouth at bedtime. 90 tablet 1  . vitamin C (ASCORBIC ACID) 500 MG tablet Take 500 mg by mouth 2 (two) times daily.       No current facility-administered medications on file prior to visit.     Past Surgical History:  Procedure Laterality Date  . HERNIA REPAIR  8527   umbilical hernia repair  . KNEE SURGERY     left knee surgery  .  TONSILLECTOMY  1959  . VASECTOMY      No Known Allergies  Social History   Social History  . Marital status: Married    Spouse name: N/A  . Number of children: N/A  . Years of education: N/A   Occupational History  . Not on file.   Social History Main Topics  . Smoking status: Never Smoker  . Smokeless tobacco: Never Used  . Alcohol use No  . Drug use: No  . Sexual activity: Not on file   Other Topics Concern  . Not on file   Social History Narrative   Occupation:  Chemical engineer   Married  -  1 daughter (age 67)    Never Smoked   Alcohol use-no     Drug use-no           Family History  Problem Relation Age of Onset  . Breast cancer Mother   . Hypertension Mother   . Hyperlipidemia Father   . Kidney disease Father   . Heart disease Father   . Hypertension Father   . Colon cancer  Neg Hx   . Stomach cancer Neg Hx     BP (!) 146/80   Pulse 67   Ht 5\' 11"  (1.803 m)   Wt 210 lb (95.3 kg)   BMI 29.29 kg/m   Review of Systems: See HPI above.     Objective:  Physical Exam:  Gen: NAD, comfortable in exam room  Left elbow: Swelling within olecranon bursa.  No bruising, erythema, warmth, other deformity. No TTP over olecranon bursa.  No other tenderness about elbow. FROM without pain. Collateral ligaments intact. NVI distally.  Right elbow: FROM without pain or swelling.   Assessment & Plan:  1. Left olecranon bursitis - Recurrent - went ahead with repeat aspiration and injection.  Icing, ibuprofen or aleve, compression.  If recurs again advised would refer to ortho to discuss bursectomy.  After informed written consent patient was seated in chair in exam room.  Area overlying left olecranon bursa was prepped with alcohol swab.  2 mL of bupivicaine was used for local anesthesia.  Then using an 18g needle on 60cc syringe, 15 mL of serosanguinous fluid was aspirated from left olecranon bursa.  Bursa was then injected with 89mL depomedrol.  Patient tolerated procedure well without immediate complications

## 2017-05-04 ENCOUNTER — Encounter: Payer: Self-pay | Admitting: Family Medicine

## 2017-05-04 ENCOUNTER — Telehealth: Payer: Self-pay | Admitting: Family Medicine

## 2017-05-04 ENCOUNTER — Ambulatory Visit (INDEPENDENT_AMBULATORY_CARE_PROVIDER_SITE_OTHER): Payer: Medicare Other | Admitting: Family Medicine

## 2017-05-04 VITALS — BP 132/80 | HR 76 | Temp 97.4°F | Resp 18 | Wt 210.0 lb

## 2017-05-04 DIAGNOSIS — G4730 Sleep apnea, unspecified: Secondary | ICD-10-CM | POA: Diagnosis not present

## 2017-05-04 DIAGNOSIS — R252 Cramp and spasm: Secondary | ICD-10-CM

## 2017-05-04 DIAGNOSIS — E782 Mixed hyperlipidemia: Secondary | ICD-10-CM

## 2017-05-04 DIAGNOSIS — Z23 Encounter for immunization: Secondary | ICD-10-CM | POA: Diagnosis not present

## 2017-05-04 DIAGNOSIS — R1310 Dysphagia, unspecified: Secondary | ICD-10-CM

## 2017-05-04 DIAGNOSIS — E1169 Type 2 diabetes mellitus with other specified complication: Secondary | ICD-10-CM | POA: Diagnosis not present

## 2017-05-04 DIAGNOSIS — E669 Obesity, unspecified: Secondary | ICD-10-CM

## 2017-05-04 DIAGNOSIS — G4700 Insomnia, unspecified: Secondary | ICD-10-CM

## 2017-05-04 DIAGNOSIS — M1A9XX0 Chronic gout, unspecified, without tophus (tophi): Secondary | ICD-10-CM

## 2017-05-04 DIAGNOSIS — I10 Essential (primary) hypertension: Secondary | ICD-10-CM

## 2017-05-04 DIAGNOSIS — K219 Gastro-esophageal reflux disease without esophagitis: Secondary | ICD-10-CM | POA: Diagnosis not present

## 2017-05-04 HISTORY — DX: Dysphagia, unspecified: R13.10

## 2017-05-04 MED ORDER — RANITIDINE HCL 300 MG PO TABS: 300.0000 mg | ORAL_TABLET | Freq: Every day | ORAL | 3 refills | Status: DC

## 2017-05-04 NOTE — Assessment & Plan Note (Signed)
Well controlled, no changes to meds. Encouraged heart healthy diet such as the DASH diet and exercise as tolerated.  °

## 2017-05-04 NOTE — Assessment & Plan Note (Signed)
hgba1c unacceptable, minimize simple carbs. Increase exercise as tolerated. Continue current meds 

## 2017-05-04 NOTE — Assessment & Plan Note (Signed)
Check uric acid. 

## 2017-05-04 NOTE — Patient Instructions (Addendum)
Can do labs at [facility] on 08/04/2017 or later [facility] Leg cramp medicine for leg cramps  Carbohydrate Counting for Diabetes Mellitus, Adult Carbohydrate counting is a method for keeping track of how many carbohydrates you eat. Eating carbohydrates naturally increases the amount of sugar (glucose) in the blood. Counting how many carbohydrates you eat helps keep your blood glucose within normal limits, which helps you manage your diabetes (diabetes mellitus). It is important to know how many carbohydrates you can safely have in each meal. This is different for every person. A diet and nutrition specialist (registered dietitian) can help you make a meal plan and calculate how many carbohydrates you should have at each meal and snack. Carbohydrates are found in the following foods:  Grains, such as breads and cereals.  Dried beans and soy products.  Starchy vegetables, such as potatoes, peas, and corn.  Fruit and fruit juices.  Milk and yogurt.  Sweets and snack foods, such as cake, cookies, candy, chips, and soft drinks.  How do I count carbohydrates? There are two ways to count carbohydrates in food. You can use either of the methods or a combination of both. Reading "Nutrition Facts" on packaged food The "Nutrition Facts" list is included on the labels of almost all packaged foods and beverages in the [place] It includes:  The serving size.  Information about nutrients in each serving, including the grams (g) of carbohydrate per serving.  To use the "Nutrition Facts":  Decide how many servings you will have.  Multiply the number of servings by the number of carbohydrates per serving.  The resulting number is the total amount of carbohydrates that you will be having.  Learning standard serving sizes of other foods When you eat foods containing carbohydrates that are not packaged or do not include "Nutrition Facts" on the label, you need to measure the servings in order to count the  amount of carbohydrates:  Measure the foods that you will eat with a food scale or measuring cup, if needed.  Decide how many standard-size servings you will eat.  Multiply the number of servings by 15. Most carbohydrate-rich foods have about 15 g of carbohydrates per serving. ? For example, if you eat 8 oz (170 g) of strawberries, you will have eaten 2 servings and 30 g of carbohydrates (2 servings x 15 g = 30 g).  For foods that have more than one food mixed, such as soups and casseroles, you must count the carbohydrates in each food that is included.  The following list contains standard serving sizes of common carbohydrate-rich foods. Each of these servings has about 15 g of carbohydrates:   hamburger bun or  English muffin.   oz (15 mL) syrup.   oz (14 g) jelly.  1 slice of bread.  1 six-inch tortilla.  3 oz (85 g) cooked rice or pasta.  4 oz (113 g) cooked dried beans.  4 oz (113 g) starchy vegetable, such as peas, corn, or potatoes.  4 oz (113 g) hot cereal.  4 oz (113 g) mashed potatoes or  of a large baked potato.  4 oz (113 g) canned or frozen fruit.  4 oz (120 mL) fruit juice.  4-6 crackers.  6 chicken nuggets.  6 oz (170 g) unsweetened dry cereal.  6 oz (170 g) plain fat-free yogurt or yogurt sweetened with artificial sweeteners.  8 oz (240 mL) milk.  8 oz (170 g) fresh fruit or one small piece of fruit.  24 oz (680  g) popped popcorn.  Example of carbohydrate counting Sample meal  3 oz (85 g) chicken breast.  6 oz (170 g) brown rice.  4 oz (113 g) corn.  8 oz (240 mL) milk.  8 oz (170 g) strawberries with sugar-free whipped topping. Carbohydrate calculation 1. Identify the foods that contain carbohydrates: ? Rice. ? Corn. ? Milk. ? Strawberries. 2. Calculate how many servings you have of each food: ? 2 servings rice. ? 1 serving corn. ? 1 serving milk. ? 1 serving strawberries. 3. Multiply each number of servings by 15 g: ? 2  servings rice x 15 g = 30 g. ? 1 serving corn x 15 g = 15 g. ? 1 serving milk x 15 g = 15 g. ? 1 serving strawberries x 15 g = 15 g. 4. Add together all of the amounts to find the total grams of carbohydrates eaten: ? 30 g + 15 g + 15 g + 15 g = 75 g of carbohydrates total. This information is not intended to replace advice given to you by your health care provider. Make sure you discuss any questions you have with your health care provider. Document Released: 06/27/2005 Document Revised: 01/15/2016 Document Reviewed: 12/09/2015 Elsevier Interactive Patient Education  [employer].

## 2017-05-04 NOTE — Assessment & Plan Note (Signed)
Tolerating statin, encouraged heart healthy diet, avoid trans fats, minimize simple carbs and saturated fats. Increase exercise as tolerated 

## 2017-05-04 NOTE — Progress Notes (Signed)
Subjective:  I acted as a Education administrator for Dr. Charlett Blake. [place], [place]  Patient ID: Evan Best, male    DOB: 28-Jan-1951, 66 y.o.   MRN: [id number]  No chief complaint on file.   HPI  Patient is in today for a 4 month follow up and overall he is doing well. He is noting some persistent RLS symptoms and has not been able to identify a pattern of why some days are worse than other. He reports he feels he hydrates well. He is endorsing worst symptoms in left thigh with some aching and even a weak sensation at times. No falls or trauma. No polyuria or polydipsia. Sugars are generally in the mid 100s. Denies CP/palp/SOB/HA/congestion/fevers/GI or GU c/o. Taking meds as prescribed  Patient Care Team: [name], MD as PCP - General (Family Medicine) Clent Jacks, MD as Consulting Physician (Ophthalmology)   Past Medical History:  Diagnosis Date  . Allergic state 09/22/2013  . Anemia 09/07/2015  . Arthritis 03/06/2015  . Diabetes mellitus type II 1/09  . Dysphagia 05/04/2017  . Esophageal reflux 03/06/2015  . Gout 09/18/2012   Left big toe  . Hypertension   . Insomnia 09/13/2015  . Migraine headache    remote history  . OSA (obstructive sleep apnea)   . Preventative health care 09/05/2014  . Right shoulder pain 02/19/2009   Qualifier: Diagnosis of  By: Wynona Luna   . RLS (restless legs syndrome) 09/07/2015  . Tremor 09/07/2015    Past Surgical History:  Procedure Laterality Date  . HERNIA REPAIR  3299   umbilical hernia repair  . KNEE SURGERY     left knee surgery  . TONSILLECTOMY  1959  . VASECTOMY      Family History  Problem Relation Age of Onset  . Breast cancer Mother   . Hypertension Mother   . Hyperlipidemia Father   . Kidney disease Father   . Heart disease Father   . Hypertension Father   . Colon cancer Neg Hx   . Stomach cancer Neg Hx     Social History   Social History  . Marital status: Married    Spouse name: N/A  . Number of children: N/A  .  Years of education: N/A   Occupational History  . Not on file.   Social History Main Topics  . Smoking status: Never Smoker  . Smokeless tobacco: Never Used  . Alcohol use No  . Drug use: No  . Sexual activity: Not on file   Other Topics Concern  . Not on file   Social History Narrative   Occupation:  Chemical engineer   Married  -  1 daughter (age 54)    Never Smoked   Alcohol use-no     Drug use-no           Outpatient Medications Prior to Visit  Medication Sig Dispense Refill  . amLODipine (NORVASC) 10 MG tablet Take one tablet by mouth daily 90 tablet 1  . Calcium-Magnesium-Zinc 1000-400-15 MG TABS Take by mouth daily.      . cetirizine (ZYRTEC) 10 MG tablet Take 10 mg by mouth daily.    . Chelated Potassium 99 MG TABS Take 99 mg by mouth 2 (two) times daily.     . Cinnamon 500 MG capsule Take 1,500 mg by mouth 3 (three) times daily. +50 mg Chromium in each supplement.    . Coenzyme Q10 (CO Q-10) 100 MG CAPS Take 200 mg by mouth  3 (three) times daily.     . Cyanocobalamin (VITAMIN B 12) 250 MCG LOZG Take 250 mcg by mouth 2 (two) times a week.      . fluticasone (FLONASE) 50 MCG/ACT nasal spray Place 2 sprays into both nostrils daily. 16 g 6  . lisinopril (PRINIVIL,ZESTRIL) 40 MG tablet Take 1 tablet (40 mg total) by mouth daily. 90 tablet 1  . metFORMIN (GLUCOPHAGE) 500 MG tablet Take 2 tablets (1,000 mg total) by mouth 2 (two) times daily with a meal. 360 tablet 1  . Omega-3 Fatty Acids (FISH OIL PO) Take 1,500 mg by mouth daily.      Marland Kitchen OVER THE COUNTER MEDICATION Phosphatidyl choline- 1 capsule bid    . Quercetin 250 MG TABS Take by mouth 2 (two) times daily.     . simvastatin (ZOCOR) 10 MG tablet Take 1 tablet (10 mg total) by mouth at bedtime. 90 tablet 1  . vitamin C (ASCORBIC ACID) 500 MG tablet Take 500 mg by mouth 2 (two) times daily.       No facility-administered medications prior to visit.     No Known Allergies  Review of Systems  Constitutional:  Negative for fever and malaise/fatigue.  HENT: Negative for congestion.   Eyes: Negative for blurred vision.  Respiratory: Negative for shortness of breath.   Cardiovascular: Negative for chest pain, palpitations and leg swelling.  Gastrointestinal: Negative for abdominal pain, blood in stool and nausea.  Genitourinary: Negative for dysuria and frequency.  Musculoskeletal: Positive for myalgias. Negative for falls.  Skin: Negative for rash.  Neurological: Negative for dizziness, loss of consciousness and headaches.  Endo/Heme/Allergies: Negative for environmental allergies.  Psychiatric/Behavioral: Negative for depression. The patient has insomnia. The patient is not nervous/anxious.        Objective:    Physical Exam  Constitutional: He is oriented to person, place, and time. He appears well-developed and well-nourished. No distress.  HENT:  Head: Normocephalic and atraumatic.  Eyes: Conjunctivae are normal.  Neck: Neck supple. No thyromegaly present.  Cardiovascular: Normal rate, regular rhythm and normal heart sounds.   No murmur heard. Pulmonary/Chest: Effort normal and breath sounds normal. No respiratory distress. He has no wheezes.  Abdominal: Soft. Bowel sounds are normal. He exhibits no mass. There is no tenderness.  Musculoskeletal: He exhibits no edema.  Lymphadenopathy:    He has no cervical adenopathy.  Neurological: He is alert and oriented to person, place, and time.  Skin: Skin is warm and dry.  Psychiatric: He has a normal mood and affect. His behavior is normal.    BP 132/80 (BP Location: Left Arm, Patient Position: Sitting, Cuff Size: Normal)   Pulse 76   Temp (!) 97.4 F (36.3 C) (Oral)   Resp 18   Wt 210 lb (95.3 kg)   SpO2 97%   BMI 29.29 kg/m  Wt Readings from Last 3 Encounters:  05/04/17 210 lb (95.3 kg)  02/16/17 210 lb (95.3 kg)  02/14/17 209 lb 9.6 oz (95.1 kg)   BP Readings from Last 3 Encounters:  05/04/17 132/80  02/16/17 (!) 146/80    02/14/17 126/68     Immunization History  Administered Date(s) Administered  . Influenza Split 05/13/2011, 05/11/2012  . Influenza Whole 05/13/2009, 05/14/2010, 04/10/2013  . Influenza, High Dose Seasonal PF 05/04/2017  . Influenza-Unspecified 04/11/2014, 04/17/2015  . Pneumococcal Conjugate-13 05/04/2017  . Tdap 06/21/2011    Health Maintenance  Topic Date Due  . HIV Screening  12/07/1966  . FOOT EXAM  03/05/2016  . COLONOSCOPY  07/26/2016  . OPHTHALMOLOGY EXAM  10/11/2017  . HEMOGLOBIN A1C  11/02/2017  . PNA vac Low Risk Adult (2 of 2 - PPSV23) 05/04/2018  . TETANUS/TDAP  06/20/2021  . INFLUENZA VACCINE  Completed  . Hepatitis C Screening  Completed    Lab Results  Component Value Date   WBC 7.7 05/04/2017   HGB 16.7 05/04/2017   HCT 47.5 05/04/2017   PLT 278.0 05/04/2017   GLUCOSE 132 (H) 05/04/2017   CHOL 112 05/04/2017   TRIG 112.0 05/04/2017   HDL 31.50 (L) 05/04/2017   LDLDIRECT 70.0 01/03/2017   LDLCALC 58 05/04/2017   ALT 26 05/04/2017   AST 22 05/04/2017   NA 137 05/04/2017   K 4.1 05/04/2017   CL 101 05/04/2017   CREATININE 0.96 05/04/2017   BUN 16 05/04/2017   CO2 30 05/04/2017   TSH 1.57 05/04/2017   PSA 0.53 06/17/2013   HGBA1C 6.5 05/04/2017   MICROALBUR 1.66 03/15/2013    Lab Results  Component Value Date   TSH 1.57 05/04/2017   Lab Results  Component Value Date   WBC 7.7 05/04/2017   HGB 16.7 05/04/2017   HCT 47.5 05/04/2017   MCV 87.8 05/04/2017   PLT 278.0 05/04/2017   Lab Results  Component Value Date   NA 137 05/04/2017   K 4.1 05/04/2017   CO2 30 05/04/2017   GLUCOSE 132 (H) 05/04/2017   BUN 16 05/04/2017   CREATININE 0.96 05/04/2017   BILITOT 0.8 05/04/2017   ALKPHOS 57 05/04/2017   AST 22 05/04/2017   ALT 26 05/04/2017   PROT 7.5 05/04/2017   ALBUMIN 4.5 05/04/2017   CALCIUM 9.5 05/04/2017   GFR 83.44 05/04/2017   Lab Results  Component Value Date   CHOL 112 05/04/2017   Lab Results  Component Value Date    HDL 31.50 (L) 05/04/2017   Lab Results  Component Value Date   LDLCALC 58 05/04/2017   Lab Results  Component Value Date   TRIG 112.0 05/04/2017   Lab Results  Component Value Date   CHOLHDL 4 05/04/2017   Lab Results  Component Value Date   HGBA1C 6.5 05/04/2017         Assessment & Plan:   Problem List Items Addressed This Visit    Hyperlipidemia, mixed    Tolerating statin, encouraged heart healthy diet, avoid trans fats, minimize simple carbs and saturated fats. Increase exercise as tolerated      Relevant Orders   Lipid panel (Completed)   Lipid panel   Sleep apnea    Not sleep well and has not had a study in a number of years. He has an appointment with Dr Annamaria Boots on 11/9 Encouraged to try a new mask with [facility]       Essential hypertension    Well controlled, no changes to meds. Encouraged heart healthy diet such as the DASH diet and exercise as tolerated.       Relevant Orders   CBC (Completed)   Comprehensive metabolic panel (Completed)   TSH (Completed)   CBC   Comprehensive metabolic panel   TSH   Diabetes mellitus type 2 in obese (HCC)    hgba1c unacceptable, minimize simple carbs. Increase exercise as tolerated. Continue current meds      Relevant Orders   Hemoglobin A1c (Completed)   Hemoglobin A1c   MUSCLE CRAMPS    Increase hydration, Hyland's leg cramp med prn, magnesium. He is noting some increased symptoms  with aching and even mild weakness in left thigh. No injury or fall. He will let us know if he is interested in a referral to PT or sports med.       Gout    Check uric acid      Relevant Orders   Uric acid (Completed)   Uric acid   Esophageal reflux   Relevant Medications   ranitidine (ZANTAC) 300 MG tablet   Other Relevant Orders   Ambulatory referral to Gastroenterology   Insomnia    Encouraged good sleep hygiene such as dark, quiet room. No blue/green glowing lights such as computer screens in bedroom. No alcohol or  stimulants in evening. Cut down on caffeine as able. Regular exercise is helpful but not just prior to bed time. Falls asleep ok but wakes up too much      Dysphagia    Other Visit Diagnoses    Needs flu shot    -  Primary   Relevant Orders   Flu vaccine HIGH DOSE PF (Fluzone High dose) (Completed)   Muscle cramp       Relevant Orders   Magnesium (Completed)      I am having Mr. Runyan start on ranitidine. I am also having him maintain his Vitamin B 12, Calcium-Magnesium-Zinc, Chelated Potassium, Cinnamon, Omega-3 Fatty Acids (FISH OIL PO), vitamin C, Co Q-10, fluticasone, OVER THE COUNTER MEDICATION, cetirizine, Quercetin, lisinopril, amLODipine, metFORMIN, and simvastatin.  Meds ordered this encounter  Medications  . ranitidine (ZANTAC) 300 MG tablet    Sig: Take 1 tablet (300 mg total) by mouth at bedtime.    Dispense:  30 tablet    Refill:  3    CMA served as scribe during this visit. History, Physical and Plan performed by medical provider. Documentation and orders reviewed and attested to.  Penni Homans, MD

## 2017-05-04 NOTE — Telephone Encounter (Signed)
Completed.

## 2017-05-04 NOTE — Assessment & Plan Note (Signed)
Not sleep well and has not had a study in a number of years. He has an appointment with Dr Annamaria Boots on 11/9 Encouraged to try a new mask with [facility]

## 2017-05-04 NOTE — Assessment & Plan Note (Addendum)
Encouraged good sleep hygiene such as dark, quiet room. No blue/green glowing lights such as computer screens in bedroom. No alcohol or stimulants in evening. Cut down on caffeine as able. Regular exercise is helpful but not just prior to bed time. Falls asleep ok but wakes up too much

## 2017-05-05 ENCOUNTER — Ambulatory Visit (INDEPENDENT_AMBULATORY_CARE_PROVIDER_SITE_OTHER): Payer: Medicare Other | Admitting: Podiatry

## 2017-05-05 DIAGNOSIS — B351 Tinea unguium: Secondary | ICD-10-CM | POA: Diagnosis not present

## 2017-05-05 DIAGNOSIS — M79675 Pain in left toe(s): Secondary | ICD-10-CM

## 2017-05-05 DIAGNOSIS — M79674 Pain in right toe(s): Secondary | ICD-10-CM

## 2017-05-05 LAB — CBC
HEMATOCRIT: 47.5 % (ref 39.0–52.0)
HEMOGLOBIN: 16.7 g/dL (ref 13.0–17.0)
MCHC: 35.2 g/dL (ref 30.0–36.0)
MCV: 87.8 fl (ref 78.0–100.0)
PLATELETS: 278 10*3/uL (ref 150.0–400.0)
RBC: 5.41 Mil/uL (ref 4.22–5.81)
RDW: 13.4 % (ref 11.5–15.5)
WBC: 7.7 10*3/uL (ref 4.0–10.5)

## 2017-05-05 LAB — COMPREHENSIVE METABOLIC PANEL
ALBUMIN: 4.5 g/dL (ref 3.5–5.2)
ALK PHOS: 57 U/L (ref 39–117)
ALT: 26 U/L (ref 0–53)
AST: 22 U/L (ref 0–37)
BILIRUBIN TOTAL: 0.8 mg/dL (ref 0.2–1.2)
BUN: 16 mg/dL (ref 6–23)
CALCIUM: 9.5 mg/dL (ref 8.4–10.5)
CO2: 30 meq/L (ref 19–32)
CREATININE: 0.96 mg/dL (ref 0.40–1.50)
Chloride: 101 mEq/L (ref 96–112)
GFR: 83.44 mL/min (ref >60.00)
Glucose, Bld: 132 mg/dL — ABNORMAL HIGH (ref 70–99)
Potassium: 4.1 mEq/L (ref 3.5–5.1)
Sodium: 137 mEq/L (ref 135–145)
Total Protein: 7.5 g/dL (ref 6.0–8.3)

## 2017-05-05 LAB — MAGNESIUM: MAGNESIUM: 2.1 mg/dL (ref 1.5–2.5)

## 2017-05-05 LAB — LIPID PANEL
Cholesterol: 112 mg/dL (ref 0–200)
HDL: 31.5 mg/dL — AB (ref >39.00)
LDL Cholesterol: 58 mg/dL (ref 0–99)
NonHDL: 80.24
TRIGLYCERIDES: 112 mg/dL (ref 0.0–149.0)
Total CHOL/HDL Ratio: 4
VLDL: 22.4 mg/dL (ref 0.0–40.0)

## 2017-05-05 LAB — TSH: TSH: 1.57 u[IU]/mL (ref 0.35–4.50)

## 2017-05-05 LAB — HEMOGLOBIN A1C: HEMOGLOBIN A1C: 6.5 % (ref 4.6–6.5)

## 2017-05-05 LAB — URIC ACID: URIC ACID, SERUM: 5.9 mg/dL (ref 4.0–7.8)

## 2017-05-07 NOTE — Assessment & Plan Note (Signed)
Increase hydration, Hyland's leg cramp med prn, magnesium. He is noting some increased symptoms with aching and even mild weakness in left thigh. No injury or fall. He will let us know if he is interested in a referral to PT or sports med.

## 2017-05-08 NOTE — Progress Notes (Signed)
Subjective:    Patient ID: Evan Best, male   DOB: 66 y.o.   MRN: [id number]   HPI patient presents stating my nails get irritated I've had long-term diabetes and it's hard for me to wear shoe gear comfortably because of it.    ROS      Objective:  Physical Exam neurovascular status intact with thick yellow nailbeds 1-5 both feet with the corners incurvated and painful   Assessment:   Mycotic nail infection with pain 1-5 both feet      Plan:    Debride painful nailbeds 1-5 both feet with no iatrogenic bleeding noted

## 2017-05-15 ENCOUNTER — Other Ambulatory Visit: Payer: Self-pay | Admitting: Family Medicine

## 2017-05-19 ENCOUNTER — Encounter: Payer: Self-pay | Admitting: Internal Medicine

## 2017-05-19 ENCOUNTER — Ambulatory Visit (INDEPENDENT_AMBULATORY_CARE_PROVIDER_SITE_OTHER): Payer: Medicare Other | Admitting: Internal Medicine

## 2017-05-19 DIAGNOSIS — G2581 Restless legs syndrome: Secondary | ICD-10-CM | POA: Diagnosis not present

## 2017-05-19 DIAGNOSIS — G4733 Obstructive sleep apnea (adult) (pediatric): Secondary | ICD-10-CM | POA: Diagnosis not present

## 2017-05-19 NOTE — Patient Instructions (Signed)
Order- DME [facility]- please replace old CPAP machine, change to auto 5-15, mask of choice, humidifier, supplies, AirView   Dx OSA  Please call if we can help 

## 2017-05-19 NOTE — Assessment & Plan Note (Addendum)
Limb movement has not disturbed his sleep as much in recent years- we will come back to this issue as needed.

## 2017-05-19 NOTE — Progress Notes (Signed)
HPI male never smoker for sleep evaluation.  Previously followed for OSA, restless legs, chronic rhinitis, complicated by HBP, DM 2, GERD, HBP, gout, NPSG 11/26/06-AHI 56/hour, desaturation to 86%, body weight 225 pounds  -----------------------------------------------------------------------------------------  05/17/13- 66 year old male never smoker followed for obstructive sleep apnea, allergic rhinitis, complicated by HBP, DM FOLLOWS FOR: wears CPAP 8/ [facility] every night for about 6 hours; pressure works well for patient; DME is [facility]. He is scheduled to get flu shot this afternoon. Occasional stuffy nose managed with Sudafed. He did not like Afrin. If not treated it interferes significantly with CPAP. We discussed saline nasal sprays. He mentions occasional dry cough so we discussed his lisinopril. He is not concerned, so change may not be necessary.   05/19/17-66 year old male never smoker for sleep evaluation.  Previously followed for OSA, restless legs, chronic rhinitis, complicated by HBP, DM 2, GERD, HBP, gout, chronic headaches Sleep Consult-DME [facility]; patient has not has break in usage since 2008. Pt needs new orders and new machine as well.  Last here in 2014. NPSG 11/26/06-AHI 56/hour, desaturation to 86%, body weight 225 pounds He has done very well with CPAP, not snoring, feeling well rested.  Definitely noticed a difference during recent power outage. Some shifting nasal congestion with no acute episodes.  ROS-see HPI   + = positive Constitutional:   No-   weight loss, night sweats, fevers, chills, fatigue, lassitude. HEENT:   No-  headaches, difficulty swallowing, tooth/dental problems, sore throat,       No-  sneezing, itching, ear ache, +nasal congestion, post nasal drip,  CV:  No-   chest pain, orthopnea, PND, swelling in lower extremities, anasarca, dizziness, palpitations Resp: No-   shortness of breath with exertion or at rest.              No-   productive cough,  +  non-productive cough,  No- coughing up of blood.              No-   change in color of mucus.  No- wheezing.   Skin: No-   rash or lesions. GI:  No-   heartburn, indigestion, abdominal pain, nausea, vomiting,  GU: . MS:  No-   joint pain or swelling.   Neuro-     nothing unusual Psych:  No- change in mood or affect. No depression or anxiety.  No memory loss.  OBJ General- Alert, Oriented, Affect-appropriate, Distress- none acute, + overweight Skin- rash-none, lesions- none, excoriation- none Lymphadenopathy- none Head- atraumatic            Eyes- Gross vision intact, PERRLA, conjunctivae clear secretions            Ears- Hearing, canals-normal            Nose- Clear, +narrower on right, +mucus bridging, polyps, erosion, perforation             Throat- Mallampati III , +mucosa red and cobblestoned , drainage- none, tonsils- atrophic Neck- flexible , trachea midline, no stridor , thyroid nl, carotid no bruit Chest - symmetrical excursion , unlabored           Heart/CV- RRR , no murmur , no gallop  , no rub, nl s1 s2                           - JVD- none , edema- none, stasis changes- none, varices- none  Lung- clear to P&A, wheeze- none, cough- none , dullness-none, rub- none           Chest wall-  Abd-  Br/ Gen/ Rectal- Not done, not indicated Extrem- cyanosis- none, clubbing, none, atrophy- none, strength- nl Neuro- grossly intact to observation

## 2017-05-19 NOTE — Assessment & Plan Note (Signed)
He has had excellent compliance and control with CPAP and knows he sleeps very much better using it.  No break in therapy since last year.  He needs replacement of old machine and supplies. -Replace old CPAP, changing to AutoPap 15/[facility]

## 2017-06-07 ENCOUNTER — Telehealth: Payer: Self-pay | Admitting: Internal Medicine

## 2017-06-07 DIAGNOSIS — G4733 Obstructive sleep apnea (adult) (pediatric): Secondary | ICD-10-CM

## 2017-06-07 NOTE — Telephone Encounter (Signed)
Called pt to let him know I was taking care of sending the order to [facility] so he could be able to receive a new CPAP machine. Pt expressed understanding. Nothing further needed.

## 2017-06-13 ENCOUNTER — Encounter: Payer: Self-pay | Admitting: Physician Assistant

## 2017-06-13 ENCOUNTER — Ambulatory Visit (INDEPENDENT_AMBULATORY_CARE_PROVIDER_SITE_OTHER): Payer: Medicare Other | Admitting: Physician Assistant

## 2017-06-13 VITALS — BP 150/76 | HR 84 | Ht 71.0 in | Wt 207.0 lb

## 2017-06-13 DIAGNOSIS — K219 Gastro-esophageal reflux disease without esophagitis: Secondary | ICD-10-CM

## 2017-06-13 DIAGNOSIS — Z86010 Personal history of colonic polyps: Secondary | ICD-10-CM

## 2017-06-13 DIAGNOSIS — Z1211 Encounter for screening for malignant neoplasm of colon: Secondary | ICD-10-CM

## 2017-06-13 MED ORDER — NA SULFATE-K SULFATE-MG SULF 17.5-3.13-1.6 GM/177ML PO SOLN: 1.0000 | Freq: Once | ORAL | 0 refills | Status: AC

## 2017-06-13 NOTE — Patient Instructions (Addendum)
You have been scheduled for a colonoscopy. Please follow written instructions given to you at your visit today.  Please pick up your prep supplies at the pharmacy within the next 1-3 days. [address]. If you use inhalers (even only as needed), please bring them with you on the day of your procedure. Your physician has requested that you go to www.startemmi.com and enter the access code given to you at your visit today. This web site gives a general overview about your procedure. However, you should still follow specific instructions given to you by our office regarding your preparation for the procedure.

## 2017-06-13 NOTE — Progress Notes (Signed)
I agree with the above note, plan 

## 2017-06-13 NOTE — Progress Notes (Signed)
Subjective:    Patient ID: Evan Best, male    DOB: [birth year], 66 y.o.   MRN: [id number]  HPI Evan Best is a pleasant 66 year old white male, known to Dr. Ardis Best from prior procedures.  He is referred back today by Dr. Randel Best for follow-up colonoscopy and also for consideration of EGD. Patient has history of adenomatous colon polyps, and had colonoscopy in January 2013 with removal of a 3 mm polyp which was a tubular adenoma.  He was indicated for 5-year interval follow-up. Patient says he mentioned at his last office visit with primary care duty and had a few episodes of what he describes as aspiration of food.  He says this is occurred may be 5 or 6 times over the past year and a couple of times with eating hamburger.  He says he will be chewing starts to swallow and then chokes and coughs.  In between these episodes he has no difficulty with liquids or solid food dysphasia.  He does not feel that the food is ever making it to his esophagus when he has these episodes but actually feels like he is just choking.  He does have history of acid reflux but has not been on medication until over the past couple of months.  He has been taking Zantac 300 mg at bedtime which is controlled his symptoms. Family history is negative for GI disease. Other medical problems include hypertension, sleep apnea and adult onset diabetes mellitus.  Review of Systems Pertinent positive and negative review of systems were noted in the above HPI section.  All other review of systems was otherwise negative.  Outpatient Encounter Medications as of 06/13/2017  Medication Sig  . amLODipine (NORVASC) 10 MG tablet Take one tablet by mouth daily  . Calcium-Magnesium-Zinc 1000-400-15 MG TABS Take by mouth daily.    . cetirizine (ZYRTEC) 10 MG tablet Take 10 mg by mouth daily.  . Chelated Potassium 99 MG TABS Take 99 mg by mouth 2 (two) times daily.   . Cinnamon 500 MG capsule Take 500 mg 3 (three) times daily by mouth.  +50 mg Chromium in each supplement.  . Coenzyme Q10 (CO Q-10) 100 MG CAPS Take 200 mg daily by mouth.   . Cyanocobalamin (VITAMIN B 12 PO) Take 1 capsule daily by mouth.  . fluticasone (FLONASE) 50 MCG/ACT nasal spray Place 2 sprays into both nostrils daily.  . Glucosamine-Chondroitin-Vit D3 1500-1200-800 MG-MG-UNIT PACK Take by mouth.  Marland Kitchen lisinopril (PRINIVIL,ZESTRIL) 40 MG tablet Take 1 tablet (40 mg total) by mouth daily.  . metFORMIN (GLUCOPHAGE) 500 MG tablet Take 2 tablets (1,000 mg total) by mouth 2 (two) times daily with a meal.  . Omega-3 Fatty Acids (FISH OIL PO) Take 1,500 mg by mouth daily.    Marland Kitchen OVER THE COUNTER MEDICATION Phosphatidyl choline- 1 capsule bid  . Quercetin 250 MG TABS Take by mouth 2 (two) times daily.   . ranitidine (ZANTAC) 300 MG tablet Take 1 tablet (300 mg total) by mouth at bedtime.  . simvastatin (ZOCOR) 10 MG tablet TAKE 1 TABLET BY MOUTH EVERYDAY AT BEDTIME  . vitamin C (ASCORBIC ACID) 500 MG tablet Take 500 mg by mouth 3 (three) times daily.   . Na Sulfate-K Sulfate-Mg Sulf 17.5-3.13-1.6 GM/177ML SOLN Take 1 kit by mouth once for 1 dose.   No facility-administered encounter medications on file as of 06/13/2017.    No Known Allergies Patient Active Problem List   Diagnosis Date Noted  . Dysphagia 05/04/2017  .  Sun-damaged skin 01/11/2017  . Olecranon bursitis of left elbow 11/19/2016  . Insomnia 09/13/2015  . Anemia 09/07/2015  . Tremor 09/07/2015  . RLS (restless legs syndrome) 09/07/2015  . Arthritis 03/06/2015  . Esophageal reflux 03/06/2015  . Preventative health care 09/05/2014  . Pain in joint, ankle and foot 04/06/2014  . Allergic state 09/22/2013  . Gout 09/18/2012  . Myalgia 12/22/2010  . ACTINIC KERATOSIS 06/17/2010  . MUSCLE CRAMPS 06/17/2010  . Right shoulder pain 02/19/2009  . COUGH 09/02/2008  . CHRONIC RHINITIS 05/16/2008  . PARESTHESIA 05/02/2008  . Hyperlipidemia, mixed 08/15/2007  . Obstructive sleep apnea 07/31/2007  .  Essential hypertension 07/31/2007  . Diabetes mellitus type 2 in obese ([place]) 07/31/2007   Social History   Socioeconomic History  . Marital status: Married    Spouse name: Not on file  . Number of children: Not on file  . Years of education: Not on file  . Highest education level: Not on file  Social Needs  . Financial resource strain: Not on file  . Food insecurity - worry: Not on file  . Food insecurity - inability: Not on file  . Transportation needs - medical: Not on file  . Transportation needs - non-medical: Not on file  Occupational History  . Not on file  Tobacco Use  . Smoking status: Never Smoker  . Smokeless tobacco: Never Used  Substance and Sexual Activity  . Alcohol use: No  . Drug use: No  . Sexual activity: Not on file  Other Topics Concern  . Not on file  Social History Narrative   Occupation:  Chemical engineer   Married  -  1 daughter (age 71)    Never Smoked   Alcohol use-no     Drug use-no           Evan Best's family history includes Breast cancer in his mother; Heart disease in his father; Hyperlipidemia in his father; Hypertension in his father and mother; Kidney disease in his father.      Objective:    Vitals:   06/13/17 1326  BP: (!) 150/76  Pulse: 84    Physical Exam well-developed older white male in no acute distress,; blood pressure 150/76 pulse 84, height 5 feet 11, weight 207, BMI 28.8.  HEENT nontraumatic normocephalic EOMI PERRLA sclera anicteric, Cardiovascular regular rate and rhythm with S1-S2 no murmur rub or gallop, Pulmonary clear bilaterally, Abdomen soft, nontender nondistended bowel sounds are active there is no palpable mass or hepatosplenomegaly, Rectal exam not done, Extremities no clubbing cyanosis or edema skin warm and dry, Neuro psych mood and affect appropriate       Assessment & Plan:   #87 66 year old white male with history of tubular adenomatous colon polyp, due for follow-up colonoscopy #2 infrequent  episodes of choking primarily occurring with hamburger.  By patient's description this sounds like choking and/or aspiration and not dysphasia. #3.  GERD-patient has just recently started on H2 blocker with good control of symptoms-not long-term and no chronic PPI therapy #4 Adult onset diabetes mellitus #5.  Obstructive sleep apnea #6.  Hypertension  Plan; Patient will be scheduled for colonoscopy with Dr. Ardis Best.  Procedure was discussed in detail with the patient including indications risks and benefits and he is agreeable to proceed.  We discussed upper endoscopy, he would like to hold off on this for the present time, and would reconsider in the future should he have any progression of his symptoms or more frequent choking episodes.  We discussed careful chewing of food, slowing down when eating and not talking while eating to decrease risk of choking and/or aspiration.  Emika Tiano S Iliana Hutt PA-C 06/13/2017   Cc: [name], MD

## 2017-06-19 ENCOUNTER — Other Ambulatory Visit: Payer: Self-pay | Admitting: Family Medicine

## 2017-07-07 ENCOUNTER — Ambulatory Visit (AMBULATORY_SURGERY_CENTER): Payer: Medicare Other | Admitting: Gastroenterology

## 2017-07-07 ENCOUNTER — Encounter: Payer: Self-pay | Admitting: Gastroenterology

## 2017-07-07 VITALS — BP 159/96 | HR 65 | Temp 96.4°F | Resp 9 | Ht 71.0 in | Wt 207.0 lb

## 2017-07-07 DIAGNOSIS — D120 Benign neoplasm of cecum: Secondary | ICD-10-CM | POA: Diagnosis not present

## 2017-07-07 DIAGNOSIS — Z86010 Personal history of colonic polyps: Secondary | ICD-10-CM

## 2017-07-07 DIAGNOSIS — Z1211 Encounter for screening for malignant neoplasm of colon: Secondary | ICD-10-CM

## 2017-07-07 DIAGNOSIS — D123 Benign neoplasm of transverse colon: Secondary | ICD-10-CM

## 2017-07-07 MED ORDER — SODIUM CHLORIDE 0.9 % IV SOLN
500.0000 mL | INTRAVENOUS | Status: DC
Start: 2017-07-07 — End: 2017-08-10

## 2017-07-07 NOTE — Patient Instructions (Signed)
Discharge instructions given. Handout on polyps. Resume previous medications. YOU HAD AN ENDOSCOPIC PROCEDURE TODAY AT THE [facility]:   Refer to the procedure report that was given to you for any specific questions about what was found during the examination.  If the procedure report does not answer your questions, please call your gastroenterologist to clarify.  If you requested that your care partner not be given the details of your procedure findings, then the procedure report has been included in a sealed envelope for you to review at your convenience later.  YOU SHOULD EXPECT: Some feelings of bloating in the abdomen. Passage of more gas than usual.  Walking can help get rid of the air that was put into your GI tract during the procedure and reduce the bloating. If you had a lower endoscopy (such as a colonoscopy or flexible sigmoidoscopy) you may notice spotting of blood in your stool or on the toilet paper. If you underwent a bowel prep for your procedure, you may not have a normal bowel movement for a few days.  Please Note:  You might notice some irritation and congestion in your nose or some drainage.  This is from the oxygen used during your procedure.  There is no need for concern and it should clear up in a day or so.  SYMPTOMS TO REPORT IMMEDIATELY:   Following lower endoscopy (colonoscopy or flexible sigmoidoscopy):  Excessive amounts of blood in the stool  Significant tenderness or worsening of abdominal pains  Swelling of the abdomen that is new, acute  Fever of 100F or higher   For urgent or emergent issues, a gastroenterologist can be reached at any hour by calling [phone].   DIET:  We do recommend a small meal at first, but then you may proceed to your regular diet.  Drink plenty of fluids but you should avoid alcoholic beverages for 24 hours.  ACTIVITY:  You should plan to take it easy for the rest of today and you should NOT DRIVE or use heavy  machinery until tomorrow (because of the sedation medicines used during the test).    FOLLOW UP: Our staff will call the number listed on your records the next business day following your procedure to check on you and address any questions or concerns that you may have regarding the information given to you following your procedure. If we do not reach you, we will leave a message.  However, if you are feeling well and you are not experiencing any problems, there is no need to return our call.  We will assume that you have returned to your regular daily activities without incident.  If any biopsies were taken you will be contacted by phone or by letter within the next 1-3 weeks.  Please call us at [phone] if you have not heard about the biopsies in 3 weeks.    SIGNATURES/CONFIDENTIALITY: You and/or your care partner have signed paperwork which will be entered into your electronic medical record.  These signatures attest to the fact that that the information above on your After Visit Summary has been reviewed and is understood.  Full responsibility of the confidentiality of this discharge information lies with you and/or your care-partner. 

## 2017-07-07 NOTE — Progress Notes (Signed)
Called to room to assist during endoscopic procedure.  Patient ID and intended procedure confirmed with present staff. Received instructions for my participation in the procedure from the performing physician.  

## 2017-07-07 NOTE — Progress Notes (Signed)
To PACU, VSS. Report to [email or web address] 

## 2017-07-07 NOTE — Op Note (Signed)
[name] Procedure Date: 07/07/2017 11:08 AM MRN: [id number] Endoscopist: Milus Banister , MD Age: 66 Referring MD:  Date of Birth: 07/26/1950 Gender: Male Account #: [id number] Procedure:                Colonoscopy Indications:              High risk colon cancer surveillance: Personal                            history of colonic polyps; colonoscopy 2013 found                            one subCM adenoma Medicines:                Monitored Anesthesia Care Procedure:                Pre-Anesthesia Assessment:                           - Prior to the procedure, a History and Physical                            was performed, and patient medications and                            allergies were reviewed. The patient's tolerance of                            previous anesthesia was also reviewed. The risks                            and benefits of the procedure and the sedation                            options and risks were discussed with the patient.                            All questions were answered, and informed consent                            was obtained. Prior Anticoagulants: The patient has                            taken no previous anticoagulant or antiplatelet                            agents. ASA Grade Assessment: II - A patient with                            mild systemic disease. After reviewing the risks                            and benefits, the patient was deemed in  satisfactory condition to undergo the procedure.                           After obtaining informed consent, the colonoscope                            was passed under direct vision. Throughout the                            procedure, the patient's blood pressure, pulse, and                            oxygen saturations were monitored continuously. The                            Colonoscope was introduced through the anus  and                            advanced to the the cecum, identified by                            appendiceal orifice and ileocecal valve. The                            colonoscopy was performed without difficulty. The                            patient tolerated the procedure well. The quality                            of the bowel preparation was good. The ileocecal                            valve, appendiceal orifice, and rectum were                            photographed. Scope In: 11:13:46 AM Scope Out: 11:26:51 AM Scope Withdrawal Time: 0 hours 8 minutes 9 seconds  Total Procedure Duration: 0 hours 13 minutes 5 seconds  Findings:                 Two sessile polyps were found in the hepatic                            flexure and cecum. The polyps were 2 to 3 mm in                            size. These polyps were removed with a cold snare.                            Resection and retrieval were complete.                           20-30cm segment of colon with signficant  circumferential mucosal scarring without associated                            stricture. This was in the region of the splenic                            flexure ?previous ischemic colitis injury.                           The exam was otherwise without abnormality on                            direct and retroflexion views. Complications:            No immediate complications. Estimated blood loss:                            None. Estimated Blood Loss:     Estimated blood loss: none. Impression:               - Two 2 to 3 mm polyps at the hepatic flexure and                            in the cecum, removed with a cold snare. Resected                            and retrieved.                           - Previous injury to the colon at splenic flexure,                            ?remote ischemic colitis.                           - The examination was otherwise normal on direct                             and retroflexion views. Recommendation:           - Patient has a contact number available for                            emergencies. The signs and symptoms of potential                            delayed complications were discussed with the                            patient. Return to normal activities tomorrow.                            Written discharge instructions were provided to the                            patient.                           -  Resume previous diet.                           - Continue present medications.                           You will receive a letter within 2-3 weeks with the                            pathology results and my final recommendations.                           If the polyp(s) is proven to be 'pre-cancerous' on                            pathology, you will need repeat colonoscopy in 5                            years. If the polyp(s) is NOT 'precancerous' on                            pathology then you should repeat colon cancer                            screening in 10 years with colonoscopy without need                            for colon cancer screening by any method prior to                            then (including stool testing). Milus Banister, MD 07/07/2017 11:33:37 AM This report has been signed electronically.

## 2017-07-10 ENCOUNTER — Telehealth: Payer: Self-pay

## 2017-07-10 NOTE — Telephone Encounter (Signed)
  Follow up Call-  Call back number 07/07/2017  Post procedure Call Back phone  # [phone]  Permission to leave phone message Yes  Some recent data might be hidden     Patient questions:  Do you have a fever, pain , or abdominal swelling? No. Pain Score  0 *  Have you tolerated food without any problems? Yes.    Have you been able to return to your normal activities? Yes.    Do you have any questions about your discharge instructions: Diet   No. Medications  No. Follow up visit  No.  Do you have questions or concerns about your Care? No.  Actions: * If pain score is 4 or above: No action needed, pain <4.

## 2017-07-17 ENCOUNTER — Encounter: Payer: Self-pay | Admitting: Gastroenterology

## 2017-08-03 ENCOUNTER — Ambulatory Visit (INDEPENDENT_AMBULATORY_CARE_PROVIDER_SITE_OTHER): Payer: Medicare Other | Admitting: Podiatry

## 2017-08-03 ENCOUNTER — Other Ambulatory Visit (INDEPENDENT_AMBULATORY_CARE_PROVIDER_SITE_OTHER): Payer: Medicare Other

## 2017-08-03 DIAGNOSIS — I10 Essential (primary) hypertension: Secondary | ICD-10-CM | POA: Diagnosis not present

## 2017-08-03 DIAGNOSIS — M79675 Pain in left toe(s): Secondary | ICD-10-CM

## 2017-08-03 DIAGNOSIS — B351 Tinea unguium: Secondary | ICD-10-CM

## 2017-08-03 DIAGNOSIS — E782 Mixed hyperlipidemia: Secondary | ICD-10-CM | POA: Diagnosis not present

## 2017-08-03 DIAGNOSIS — E669 Obesity, unspecified: Secondary | ICD-10-CM

## 2017-08-03 DIAGNOSIS — M1A9XX0 Chronic gout, unspecified, without tophus (tophi): Secondary | ICD-10-CM | POA: Diagnosis not present

## 2017-08-03 DIAGNOSIS — E1169 Type 2 diabetes mellitus with other specified complication: Secondary | ICD-10-CM | POA: Diagnosis not present

## 2017-08-03 DIAGNOSIS — M79674 Pain in right toe(s): Secondary | ICD-10-CM | POA: Diagnosis not present

## 2017-08-03 LAB — CBC
HEMATOCRIT: 43.8 % (ref 39.0–52.0)
Hemoglobin: 15.5 g/dL (ref 13.0–17.0)
MCHC: 35.4 g/dL (ref 30.0–36.0)
MCV: 84.4 fl (ref 78.0–100.0)
PLATELETS: 278 10*3/uL (ref 150.0–400.0)
RBC: 5.18 Mil/uL (ref 4.22–5.81)
RDW: 12.9 % (ref 11.5–15.5)
WBC: 7.6 10*3/uL (ref 4.0–10.5)

## 2017-08-03 LAB — LIPID PANEL
CHOL/HDL RATIO: 4
CHOLESTEROL: 119 mg/dL (ref 0–200)
HDL: 30.1 mg/dL — AB (ref >39.00)
NONHDL: 88.66
TRIGLYCERIDES: 221 mg/dL — AB (ref 0.0–149.0)
VLDL: 44.2 mg/dL — AB (ref 0.0–40.0)

## 2017-08-03 LAB — COMPREHENSIVE METABOLIC PANEL
ALT: 31 U/L (ref 0–53)
AST: 22 U/L (ref 0–37)
Albumin: 4.1 g/dL (ref 3.5–5.2)
Alkaline Phosphatase: 61 U/L (ref 39–117)
BUN: 21 mg/dL (ref 6–23)
CALCIUM: 9.1 mg/dL (ref 8.4–10.5)
CO2: 30 meq/L (ref 19–32)
Chloride: 100 mEq/L (ref 96–112)
Creatinine, Ser: 0.97 mg/dL (ref 0.40–1.50)
GFR: 82.39 mL/min (ref >60.00)
GLUCOSE: 191 mg/dL — AB (ref 70–99)
POTASSIUM: 3.9 meq/L (ref 3.5–5.1)
Sodium: 136 mEq/L (ref 135–145)
Total Bilirubin: 0.6 mg/dL (ref 0.2–1.2)
Total Protein: 7.2 g/dL (ref 6.0–8.3)

## 2017-08-03 LAB — URIC ACID: URIC ACID, SERUM: 6.1 mg/dL (ref 4.0–7.8)

## 2017-08-03 LAB — HEMOGLOBIN A1C: Hgb A1c MFr Bld: 6 % (ref 4.6–6.5)

## 2017-08-03 LAB — LDL CHOLESTEROL, DIRECT: LDL DIRECT: 74 mg/dL

## 2017-08-03 LAB — TSH: TSH: 2.12 u[IU]/mL (ref 0.35–4.50)

## 2017-08-03 NOTE — Progress Notes (Signed)
Subjective:   Patient ID: Evan Best, male   DOB: 67 y.o.   MRN: [id number]   HPI Patient presents with thick yellow nailbeds 1-5 both feet that he cannot cut   ROS      Objective:  Physical Exam  Neurovascular status shows incurvated nailbeds 1-5 both feet that are irritating the corners and painful with thick debris noted     Assessment:  Mycotic nail infection with pain 1-5 both feet     Plan:  Debridement painful nailbeds 1-5 both feet with no iatrogenic bleeding noted

## 2017-08-10 ENCOUNTER — Encounter: Payer: Self-pay | Admitting: Family Medicine

## 2017-08-10 ENCOUNTER — Ambulatory Visit (INDEPENDENT_AMBULATORY_CARE_PROVIDER_SITE_OTHER): Payer: Medicare Other | Admitting: Family Medicine

## 2017-08-10 VITALS — BP 148/71 | HR 81 | Temp 98.4°F | Resp 16 | Ht 71.0 in | Wt 209.8 lb

## 2017-08-10 DIAGNOSIS — M1A9XX0 Chronic gout, unspecified, without tophus (tophi): Secondary | ICD-10-CM

## 2017-08-10 DIAGNOSIS — E1169 Type 2 diabetes mellitus with other specified complication: Secondary | ICD-10-CM | POA: Diagnosis not present

## 2017-08-10 DIAGNOSIS — M1990 Unspecified osteoarthritis, unspecified site: Secondary | ICD-10-CM | POA: Diagnosis not present

## 2017-08-10 DIAGNOSIS — E669 Obesity, unspecified: Secondary | ICD-10-CM

## 2017-08-10 DIAGNOSIS — I10 Essential (primary) hypertension: Secondary | ICD-10-CM | POA: Diagnosis not present

## 2017-08-10 DIAGNOSIS — R351 Nocturia: Secondary | ICD-10-CM | POA: Diagnosis not present

## 2017-08-10 DIAGNOSIS — E782 Mixed hyperlipidemia: Secondary | ICD-10-CM

## 2017-08-10 NOTE — Assessment & Plan Note (Signed)
Right thumb stiff, weak and painful offered referral to hand specialist if worse. Consider more ice, splinting and topical treatment

## 2017-08-10 NOTE — Assessment & Plan Note (Signed)
No recent flare.  

## 2017-08-10 NOTE — Patient Instructions (Addendum)
Shingrix is the new shingles shot, 2 shots over 2 to 6 months. Check with insurance to confirm payment.    Hypertension Hypertension is another name for high blood pressure. High blood pressure forces your heart to work harder to pump blood. This can cause problems over time. There are two numbers in a blood pressure reading. There is a top number (systolic) over a bottom number (diastolic). It is best to have a blood pressure below 120/80. Healthy choices can help lower your blood pressure. You may need medicine to help lower your blood pressure if:  Your blood pressure cannot be lowered with healthy choices.  Your blood pressure is higher than 130/80.  Follow these instructions at home: Eating and drinking  If directed, follow the DASH eating plan. This diet includes: ? Filling half of your plate at each meal with fruits and vegetables. ? Filling one quarter of your plate at each meal with whole grains. Whole grains include whole wheat pasta, brown rice, and whole grain bread. ? Eating or drinking low-fat dairy products, such as skim milk or low-fat yogurt. ? Filling one quarter of your plate at each meal with low-fat (lean) proteins. Low-fat proteins include fish, skinless chicken, eggs, beans, and tofu. ? Avoiding fatty meat, cured and processed meat, or chicken with skin. ? Avoiding premade or processed food.  Eat less than 1,500 mg of salt (sodium) a day.  Limit alcohol use to no more than 1 drink a day for nonpregnant women and 2 drinks a day for men. One drink equals 12 oz of beer, 5 oz of wine, or 1 oz of hard liquor. Lifestyle  Work with your doctor to stay at a healthy weight or to lose weight. Ask your doctor what the best weight is for you.  Get at least 30 minutes of exercise that causes your heart to beat faster (aerobic exercise) most days of the week. This may include walking, swimming, or biking.  Get at least 30 minutes of exercise that strengthens your muscles  (resistance exercise) at least 3 days a week. This may include lifting weights or pilates.  Do not use any products that contain nicotine or tobacco. This includes cigarettes and e-cigarettes. If you need help quitting, ask your doctor.  Check your blood pressure at home as told by your doctor.  Keep all follow-up visits as told by your doctor. This is important. Medicines  Take over-the-counter and prescription medicines only as told by your doctor. Follow directions carefully.  Do not skip doses of blood pressure medicine. The medicine does not work as well if you skip doses. Skipping doses also puts you at risk for problems.  Ask your doctor about side effects or reactions to medicines that you should watch for. Contact a doctor if:  You think you are having a reaction to the medicine you are taking.  You have headaches that keep coming back (recurring).  You feel dizzy.  You have swelling in your ankles.  You have trouble with your vision. Get help right away if:  You get a very bad headache.  You start to feel confused.  You feel weak or numb.  You feel faint.  You get very bad pain in your: ? Chest. ? Belly (abdomen).  You throw up (vomit) more than once.  You have trouble breathing. Summary  Hypertension is another name for high blood pressure.  Making healthy choices can help lower blood pressure. If your blood pressure cannot be controlled with healthy  choices, you may need to take medicine. This information is not intended to replace advice given to you by your health care provider. Make sure you discuss any questions you have with your health care provider. Document Released: 12/14/2007 Document Revised: 05/25/2016 Document Reviewed: 05/25/2016 Elsevier Interactive Patient Education  [employer].

## 2017-08-10 NOTE — Assessment & Plan Note (Signed)
Tolerating statin, encouraged heart healthy diet, avoid trans fats, minimize simple carbs and saturated fats. Increase exercise as tolerated 

## 2017-08-10 NOTE — Assessment & Plan Note (Signed)
hgba1c acceptable, minimize simple carbs. Increase exercise as tolerated. Continue current meds 

## 2017-08-10 NOTE — Assessment & Plan Note (Signed)
Well controlled, no changes to meds. Encouraged heart healthy diet such as the DASH diet and exercise as tolerated.  °

## 2017-08-13 NOTE — Progress Notes (Signed)
Patient ID: Evan Best, male   DOB: 05-24-1951, 67 y.o.   MRN: [id number]   Subjective:    Patient ID: Evan Best, male    DOB: February 07, 1951, 67 y.o.   MRN: [id number]  Chief Complaint  Patient presents with  . Hypertension    3 month follow up  . Hyperlipidemia  . Diabetes    HPI Patient is in today for follow up and overall he is feeling well mostly. Is noting some pain and stiffness in his right thumb but he denies any recent injury or swelling. Denies any polyuria or polydipsia. Denies CP/palp/SOB/HA/congestion/fevers/GI or GU c/o. Taking meds as prescribed  Past Medical History:  Diagnosis Date  . Allergic state 09/22/2013  . Anemia 09/07/2015  . Arthritis 03/06/2015  . Diabetes mellitus type II 1/09  . Dysphagia 05/04/2017  . Esophageal reflux 03/06/2015  . Gout 09/18/2012   Left big toe  . Hypertension   . Insomnia 09/13/2015  . Migraine headache    remote history  . OSA (obstructive sleep apnea)   . Preventative health care 09/05/2014  . Right shoulder pain 02/19/2009   Qualifier: Diagnosis of  By: Wynona Luna   . RLS (restless legs syndrome) 09/07/2015  . Sleep apnea    uses cpap  . Tremor 09/07/2015    Past Surgical History:  Procedure Laterality Date  . HERNIA REPAIR  7253   umbilical hernia repair  . KNEE SURGERY     left knee surgery  . TONSILLECTOMY  1959  . VASECTOMY      Family History  Problem Relation Age of Onset  . Breast cancer Mother   . Hypertension Mother   . Hyperlipidemia Father   . Kidney disease Father   . Heart disease Father   . Hypertension Father   . Colon cancer Neg Hx   . Stomach cancer Neg Hx     Social History   Socioeconomic History  . Marital status: Married    Spouse name: Not on file  . Number of children: Not on file  . Years of education: Not on file  . Highest education level: Not on file  Social Needs  . Financial resource strain: Not on file  . Food insecurity - worry: Not on file  . Food  insecurity - inability: Not on file  . Transportation needs - medical: Not on file  . Transportation needs - non-medical: Not on file  Occupational History  . Not on file  Tobacco Use  . Smoking status: Never Smoker  . Smokeless tobacco: Never Used  Substance and Sexual Activity  . Alcohol use: No  . Drug use: No  . Sexual activity: Not on file  Other Topics Concern  . Not on file  Social History Narrative   Occupation:  Chemical engineer   Married  -  1 daughter (age 29)    Never Smoked   Alcohol use-no     Drug use-no           Outpatient Medications Prior to Visit  Medication Sig Dispense Refill  . amLODipine (NORVASC) 10 MG tablet TAKE 1 TABLET BY MOUTH EVERY DAY 90 tablet 1  . Calcium-Magnesium-Zinc 1000-400-15 MG TABS Take by mouth daily.      . cetirizine (ZYRTEC) 10 MG tablet Take 10 mg by mouth daily.    . Chelated Potassium 99 MG TABS Take 99 mg by mouth 2 (two) times daily.     . Cinnamon 500 MG capsule Take  500 mg 3 (three) times daily by mouth. +50 mg Chromium in each supplement.    . Coenzyme Q10 (CO Q-10) 100 MG CAPS Take 200 mg daily by mouth.     . Cyanocobalamin (VITAMIN B 12 PO) Take 1 capsule daily by mouth.    . fluticasone (FLONASE) 50 MCG/ACT nasal spray Place 2 sprays into both nostrils daily. 16 g 6  . Glucosamine-Chondroitin-Vit D3 1500-1200-800 MG-MG-UNIT PACK Take by mouth.    Marland Kitchen lisinopril (PRINIVIL,ZESTRIL) 40 MG tablet TAKE 1 TABLET BY MOUTH EVERY DAY 90 tablet 1  . metFORMIN (GLUCOPHAGE) 500 MG tablet TAKE 2 TABLETS (1,000 MG TOTAL) BY MOUTH 2 (TWO) TIMES DAILY WITH A MEAL. 360 tablet 1  . Omega-3 Fatty Acids (FISH OIL PO) Take 1,500 mg by mouth daily.      Marland Kitchen OVER THE COUNTER MEDICATION Phosphatidyl choline- 1 capsule bid    . Quercetin 250 MG TABS Take by mouth 2 (two) times daily.     . ranitidine (ZANTAC) 300 MG tablet Take 1 tablet (300 mg total) by mouth at bedtime. 30 tablet 3  . simvastatin (ZOCOR) 10 MG tablet TAKE 1 TABLET BY MOUTH  EVERYDAY AT BEDTIME 90 tablet 0  . vitamin C (ASCORBIC ACID) 500 MG tablet Take 500 mg by mouth 3 (three) times daily.     Marland Kitchen 0.9 %  sodium chloride infusion      No facility-administered medications prior to visit.     No Known Allergies  Review of Systems  Constitutional: Negative for fever and malaise/fatigue.  HENT: Negative for congestion.   Eyes: Negative for blurred vision.  Respiratory: Negative for shortness of breath.   Cardiovascular: Negative for chest pain, palpitations and leg swelling.  Gastrointestinal: Negative for abdominal pain, blood in stool and nausea.  Genitourinary: Negative for dysuria and frequency.  Musculoskeletal: Positive for joint pain. Negative for falls.  Skin: Negative for rash.  Neurological: Negative for dizziness, loss of consciousness and headaches.  Endo/Heme/Allergies: Negative for environmental allergies.  Psychiatric/Behavioral: Negative for depression. The patient is not nervous/anxious.        Objective:    Physical Exam  Constitutional: He is oriented to person, place, and time. He appears well-developed and well-nourished. No distress.  HENT:  Head: Normocephalic and atraumatic.  Nose: Nose normal.  Eyes: Right eye exhibits no discharge. Left eye exhibits no discharge.  Neck: Normal range of motion. Neck supple.  Cardiovascular: Normal rate and regular rhythm.  No murmur heard. Pulmonary/Chest: Effort normal and breath sounds normal.  Abdominal: Soft. Bowel sounds are normal. There is no tenderness.  Musculoskeletal: He exhibits no edema.  Neurological: He is alert and oriented to person, place, and time.  Skin: Skin is warm and dry.  Psychiatric: He has a normal mood and affect.  Nursing note and vitals reviewed.   BP (!) 148/71 (BP Location: Left Arm, Patient Position: Sitting, Cuff Size: Large)   Pulse 81   Temp 98.4 F (36.9 C) (Oral)   Resp 16   Ht 5\' 11"  (1.803 m)   Wt 209 lb 12.8 oz (95.2 kg)   SpO2 98%   BMI  29.26 kg/m  Wt Readings from Last 3 Encounters:  08/10/17 209 lb 12.8 oz (95.2 kg)  07/07/17 207 lb (93.9 kg)  06/13/17 207 lb (93.9 kg)     Lab Results  Component Value Date   WBC 7.6 08/03/2017   HGB 15.5 08/03/2017   HCT 43.8 08/03/2017   PLT 278.0 08/03/2017   GLUCOSE  191 (H) 08/03/2017   CHOL 119 08/03/2017   TRIG 221.0 (H) 08/03/2017   HDL 30.10 (L) 08/03/2017   LDLDIRECT 74.0 08/03/2017   LDLCALC 58 05/04/2017   ALT 31 08/03/2017   AST 22 08/03/2017   NA 136 08/03/2017   K 3.9 08/03/2017   CL 100 08/03/2017   CREATININE 0.97 08/03/2017   BUN 21 08/03/2017   CO2 30 08/03/2017   TSH 2.12 08/03/2017   PSA 0.53 06/17/2013   HGBA1C 6.0 08/03/2017   MICROALBUR 1.66 03/15/2013    Lab Results  Component Value Date   TSH 2.12 08/03/2017   Lab Results  Component Value Date   WBC 7.6 08/03/2017   HGB 15.5 08/03/2017   HCT 43.8 08/03/2017   MCV 84.4 08/03/2017   PLT 278.0 08/03/2017   Lab Results  Component Value Date   NA 136 08/03/2017   K 3.9 08/03/2017   CO2 30 08/03/2017   GLUCOSE 191 (H) 08/03/2017   BUN 21 08/03/2017   CREATININE 0.97 08/03/2017   BILITOT 0.6 08/03/2017   ALKPHOS 61 08/03/2017   AST 22 08/03/2017   ALT 31 08/03/2017   PROT 7.2 08/03/2017   ALBUMIN 4.1 08/03/2017   CALCIUM 9.1 08/03/2017   GFR 82.39 08/03/2017   Lab Results  Component Value Date   CHOL 119 08/03/2017   Lab Results  Component Value Date   HDL 30.10 (L) 08/03/2017   Lab Results  Component Value Date   LDLCALC 58 05/04/2017   Lab Results  Component Value Date   TRIG 221.0 (H) 08/03/2017   Lab Results  Component Value Date   CHOLHDL 4 08/03/2017   Lab Results  Component Value Date   HGBA1C 6.0 08/03/2017       Assessment & Plan:   Problem List Items Addressed This Visit    Hyperlipidemia, mixed    Tolerating statin, encouraged heart healthy diet, avoid trans fats, minimize simple carbs and saturated fats. Increase exercise as tolerated       Relevant Orders   Lipid panel   Essential hypertension    Well controlled, no changes to meds. Encouraged heart healthy diet such as the DASH diet and exercise as tolerated.       Relevant Orders   CBC   Comprehensive metabolic panel   TSH   Diabetes mellitus type 2 in obese (HCC)    hgba1c acceptable, minimize simple carbs. Increase exercise as tolerated. Continue current meds      Relevant Orders   Hemoglobin A1c   TSH   Gout    No recent flare.       Relevant Orders   Uric acid   Arthritis    Right thumb stiff, weak and painful offered referral to hand specialist if worse. Consider more ice, splinting and topical treatment       Other Visit Diagnoses    Nocturia    -  Primary   Relevant Orders   PSA      I am having Evan Best maintain his Calcium-Magnesium-Zinc, Chelated Potassium, Cinnamon, Omega-3 Fatty Acids (FISH OIL PO), vitamin C, Co Q-10, fluticasone, OVER THE COUNTER MEDICATION, cetirizine, Quercetin, ranitidine, simvastatin, Cyanocobalamin (VITAMIN B 12 PO), Glucosamine-Chondroitin-Vit D3, metFORMIN, lisinopril, and amLODipine. We will stop administering sodium chloride.  No orders of the defined types were placed in this encounter.    Penni Homans, MD

## 2017-08-24 ENCOUNTER — Ambulatory Visit (INDEPENDENT_AMBULATORY_CARE_PROVIDER_SITE_OTHER): Payer: Medicare Other | Admitting: Internal Medicine

## 2017-08-24 ENCOUNTER — Encounter: Payer: Self-pay | Admitting: Internal Medicine

## 2017-08-24 DIAGNOSIS — G4733 Obstructive sleep apnea (adult) (pediatric): Secondary | ICD-10-CM | POA: Diagnosis not present

## 2017-08-24 NOTE — Progress Notes (Signed)
HPI male never smoker for sleep evaluation.  Previously followed for OSA, restless legs, chronic rhinitis, complicated by HBP, DM 2, GERD, HBP, gout, NPSG 11/26/06-AHI 56/hour, desaturation to 86%, body weight 225 pounds  ----------------------------------------------------------------------------------------  05/19/17-67 year old male never smoker for sleep evaluation.  Previously followed for OSA, restless legs, chronic rhinitis, complicated by HBP, DM 2, GERD, HBP, gout, chronic headaches Sleep Consult-DME [facility]; patient has not has break in usage since 2008. Pt needs new orders and new machine as well.  Last here in 2014. NPSG 11/26/06-AHI 56/hour, desaturation to 86%, body weight 225 pounds He has done very well with CPAP, not snoring, feeling well rested.  Definitely noticed a difference during recent power outage. Some shifting nasal congestion with no acute episodes.   08/24/17-66 year old male never smoker followed for OSA, restless legs, chronic rhinitis, complicated by HBP, DM 2, GERD, gout, chronic headaches CPAP auto 5-15/Advanced  ----OSA; DME: [facility]. Pt wears CPAP nightly and DL attached. No new supplies needed.  Download 90% compliance, AHI 0.4/hour. Using nasal mask.  No acute concerns or changes.  Does not indicate much leg movement disturbance now.  ROS-see HPI   + = positive Constitutional:   No-   weight loss, night sweats, fevers, chills, fatigue, lassitude. HEENT:   No-  headaches, difficulty swallowing, tooth/dental problems, sore throat,       No-  sneezing, itching, ear ache, nasal congestion, post nasal drip,  CV:  No-   chest pain, orthopnea, PND, swelling in lower extremities, anasarca, dizziness, palpitations Resp: No-   shortness of breath with exertion or at rest.              No-   productive cough,   non-productive cough,  No- coughing up of blood.              No-   change in color of mucus.  No- wheezing.   Skin: No-   rash or lesions. GI:  No-   heartburn,  indigestion, abdominal pain, nausea, vomiting,  GU: . MS:  No-   joint pain or swelling.   Neuro-     nothing unusual Psych:  No- change in mood or affect. No depression or anxiety.  No memory loss.  OBJ General- Alert, Oriented, Affect-appropriate, Distress- none acute, + overweight Skin- rash-none, lesions- none, excoriation- none Lymphadenopathy- none Head- atraumatic            Eyes- Gross vision intact, PERRLA, conjunctivae clear secretions            Ears- Hearing, canals-normal            Nose- Clear, +narrower on right, +mucus bridging, polyps, erosion, perforation             Throat- Mallampati III , +mucosa red and cobblestoned , drainage- none, tonsils- atrophic Neck- flexible , trachea midline, no stridor , thyroid nl, carotid no bruit Chest - symmetrical excursion , unlabored           Heart/CV- RRR , no murmur , no gallop  , no rub, nl s1 s2                           - JVD- none , edema- none, stasis changes- none, varices- none           Lung- clear to P&A, wheeze- none, cough- none , dullness-none, rub- none           Chest wall-  Abd-  Br/ Gen/ Rectal- Not done, not indicated Extrem- cyanosis- none, clubbing, none, atrophy- none, strength- nl Neuro- grossly intact to observation

## 2017-08-24 NOTE — Patient Instructions (Signed)
We can continue CPAP auto 5-15, mask of choice, humidifier, supplies, [facility]view   Dx OSA  Please call if we can help

## 2017-08-25 NOTE — Assessment & Plan Note (Addendum)
Download confirms continued excellent compliance and control.  He sleeps better and feels he continues to benefit from CPAP.  We discussed comfort and mask issues with no changes needed. -Continue auto 5-15

## 2017-09-01 ENCOUNTER — Other Ambulatory Visit: Payer: Self-pay | Admitting: Family Medicine

## 2017-09-06 DIAGNOSIS — D225 Melanocytic nevi of trunk: Secondary | ICD-10-CM | POA: Diagnosis not present

## 2017-09-06 DIAGNOSIS — L814 Other melanin hyperpigmentation: Secondary | ICD-10-CM | POA: Diagnosis not present

## 2017-09-06 DIAGNOSIS — Z23 Encounter for immunization: Secondary | ICD-10-CM | POA: Diagnosis not present

## 2017-09-06 DIAGNOSIS — D1801 Hemangioma of skin and subcutaneous tissue: Secondary | ICD-10-CM | POA: Diagnosis not present

## 2017-09-06 DIAGNOSIS — L821 Other seborrheic keratosis: Secondary | ICD-10-CM | POA: Diagnosis not present

## 2017-09-06 DIAGNOSIS — D234 Other benign neoplasm of skin of scalp and neck: Secondary | ICD-10-CM | POA: Diagnosis not present

## 2017-09-06 DIAGNOSIS — D485 Neoplasm of uncertain behavior of skin: Secondary | ICD-10-CM | POA: Diagnosis not present

## 2017-09-20 ENCOUNTER — Other Ambulatory Visit: Payer: Self-pay

## 2017-09-20 MED ORDER — LISINOPRIL 40 MG PO TABS: 40.0000 mg | ORAL_TABLET | Freq: Every day | ORAL | 1 refills | Status: DC

## 2017-10-11 DIAGNOSIS — H353131 Nonexudative age-related macular degeneration, bilateral, early dry stage: Secondary | ICD-10-CM | POA: Diagnosis not present

## 2017-10-11 DIAGNOSIS — H04123 Dry eye syndrome of bilateral lacrimal glands: Secondary | ICD-10-CM | POA: Diagnosis not present

## 2017-10-11 DIAGNOSIS — H2513 Age-related nuclear cataract, bilateral: Secondary | ICD-10-CM | POA: Diagnosis not present

## 2017-10-11 DIAGNOSIS — E119 Type 2 diabetes mellitus without complications: Secondary | ICD-10-CM | POA: Diagnosis not present

## 2017-10-11 LAB — HM DIABETES EYE EXAM

## 2017-11-01 ENCOUNTER — Ambulatory Visit: Payer: Medicare Other | Admitting: Podiatry

## 2017-11-01 ENCOUNTER — Encounter: Payer: Self-pay | Admitting: Podiatry

## 2017-11-01 ENCOUNTER — Ambulatory Visit (INDEPENDENT_AMBULATORY_CARE_PROVIDER_SITE_OTHER): Payer: Medicare Other | Admitting: Podiatry

## 2017-11-01 DIAGNOSIS — M79674 Pain in right toe(s): Secondary | ICD-10-CM

## 2017-11-01 DIAGNOSIS — M79675 Pain in left toe(s): Secondary | ICD-10-CM | POA: Diagnosis not present

## 2017-11-01 DIAGNOSIS — B351 Tinea unguium: Secondary | ICD-10-CM

## 2017-11-02 NOTE — Progress Notes (Signed)
Subjective:   Patient ID: Evan Best, male   DOB: 67 y.o.   MRN: [id number]   HPI Patient presents with thick nail disease 1-5 both feet that are moderately painful when palpated   ROS      Objective:  Physical Exam  Thick brittle nailbeds 1-5 both feet     Assessment:  Mycotic nail infection with pain 1-5 both feet     Plan:  Debride painful nail bed 1-5 both feet with no iatrogenic bleeding

## 2017-11-10 ENCOUNTER — Encounter: Payer: Self-pay | Admitting: Family Medicine

## 2017-11-23 ENCOUNTER — Other Ambulatory Visit: Payer: Self-pay | Admitting: Family Medicine

## 2017-12-14 ENCOUNTER — Other Ambulatory Visit: Payer: Self-pay | Admitting: Family Medicine

## 2018-01-31 ENCOUNTER — Encounter: Payer: Self-pay | Admitting: Podiatry

## 2018-01-31 ENCOUNTER — Ambulatory Visit (INDEPENDENT_AMBULATORY_CARE_PROVIDER_SITE_OTHER): Payer: Medicare Other | Admitting: Podiatry

## 2018-01-31 DIAGNOSIS — M79675 Pain in left toe(s): Secondary | ICD-10-CM | POA: Diagnosis not present

## 2018-01-31 DIAGNOSIS — M79674 Pain in right toe(s): Secondary | ICD-10-CM

## 2018-01-31 DIAGNOSIS — B351 Tinea unguium: Secondary | ICD-10-CM

## 2018-02-01 NOTE — Progress Notes (Signed)
Subjective:   Patient ID: Evan Best, male   DOB: 67 y.o.   MRN: [id number]   HPI Patient presents with elongated nailbeds 1-5 both feet that are thick yellow brittle and moderately painful when palpated   ROS      Objective:  Physical Exam  Thick yellow brittle nailbeds 1-5 both feet that are hard to cut and incurvated in the corners with pain     Assessment:  Mycotic nail infection with pain 1-5 both feet     Plan:  Debride painful nailbeds 1-5 both feet with no iatrogenic bleeding noted

## 2018-02-08 ENCOUNTER — Other Ambulatory Visit (INDEPENDENT_AMBULATORY_CARE_PROVIDER_SITE_OTHER): Payer: Medicare Other

## 2018-02-08 DIAGNOSIS — R351 Nocturia: Secondary | ICD-10-CM

## 2018-02-08 DIAGNOSIS — E669 Obesity, unspecified: Secondary | ICD-10-CM | POA: Diagnosis not present

## 2018-02-08 DIAGNOSIS — E782 Mixed hyperlipidemia: Secondary | ICD-10-CM

## 2018-02-08 DIAGNOSIS — E1169 Type 2 diabetes mellitus with other specified complication: Secondary | ICD-10-CM | POA: Diagnosis not present

## 2018-02-08 DIAGNOSIS — I10 Essential (primary) hypertension: Secondary | ICD-10-CM

## 2018-02-08 DIAGNOSIS — M1A9XX0 Chronic gout, unspecified, without tophus (tophi): Secondary | ICD-10-CM

## 2018-02-08 LAB — COMPREHENSIVE METABOLIC PANEL
ALT: 35 U/L (ref 0–53)
AST: 21 U/L (ref 0–37)
Albumin: 4.1 g/dL (ref 3.5–5.2)
Alkaline Phosphatase: 61 U/L (ref 39–117)
BUN: 17 mg/dL (ref 6–23)
CO2: 31 meq/L (ref 19–32)
CREATININE: 1.09 mg/dL (ref 0.40–1.50)
Calcium: 9.4 mg/dL (ref 8.4–10.5)
Chloride: 101 mEq/L (ref 96–112)
GFR: 71.9 mL/min (ref >60.00)
Glucose, Bld: 167 mg/dL — ABNORMAL HIGH (ref 70–99)
Potassium: 4.3 mEq/L (ref 3.5–5.1)
SODIUM: 139 meq/L (ref 135–145)
Total Bilirubin: 0.8 mg/dL (ref 0.2–1.2)
Total Protein: 7 g/dL (ref 6.0–8.3)

## 2018-02-08 LAB — URIC ACID: Uric Acid, Serum: 6.6 mg/dL (ref 4.0–7.8)

## 2018-02-08 LAB — LIPID PANEL
CHOL/HDL RATIO: 4
Cholesterol: 120 mg/dL (ref 0–200)
HDL: 29.1 mg/dL — ABNORMAL LOW (ref >39.00)
LDL CALC: 51 mg/dL (ref 0–99)
NonHDL: 91.25
Triglycerides: 199 mg/dL — ABNORMAL HIGH (ref 0.0–149.0)
VLDL: 39.8 mg/dL (ref 0.0–40.0)

## 2018-02-08 LAB — CBC
HCT: 41.7 % (ref 39.0–52.0)
Hemoglobin: 14.7 g/dL (ref 13.0–17.0)
MCHC: 35.2 g/dL (ref 30.0–36.0)
MCV: 80.4 fl (ref 78.0–100.0)
Platelets: 294 10*3/uL (ref 150.0–400.0)
RBC: 5.19 Mil/uL (ref 4.22–5.81)
RDW: 13.7 % (ref 11.5–15.5)
WBC: 7 10*3/uL (ref 4.0–10.5)

## 2018-02-08 LAB — HEMOGLOBIN A1C: Hgb A1c MFr Bld: 7.4 % — ABNORMAL HIGH (ref 4.6–6.5)

## 2018-02-08 LAB — TSH: TSH: 1.83 u[IU]/mL (ref 0.35–4.50)

## 2018-02-08 LAB — PSA: PSA: 0.61 ng/mL (ref 0.10–4.00)

## 2018-02-08 NOTE — Addendum Note (Signed)
Addended by: Raliegh Ip on: 02/08/2018 01:09 PM   Modules accepted: Orders

## 2018-02-08 NOTE — Addendum Note (Signed)
Addended by: Raliegh Ip on: 02/08/2018 01:08 PM   Modules accepted: Orders

## 2018-02-12 ENCOUNTER — Other Ambulatory Visit: Payer: Self-pay | Admitting: Family Medicine

## 2018-02-12 ENCOUNTER — Ambulatory Visit (INDEPENDENT_AMBULATORY_CARE_PROVIDER_SITE_OTHER): Payer: Medicare Other | Admitting: Family Medicine

## 2018-02-12 DIAGNOSIS — D1801 Hemangioma of skin and subcutaneous tissue: Secondary | ICD-10-CM

## 2018-02-12 DIAGNOSIS — I10 Essential (primary) hypertension: Secondary | ICD-10-CM | POA: Diagnosis not present

## 2018-02-12 DIAGNOSIS — E1169 Type 2 diabetes mellitus with other specified complication: Secondary | ICD-10-CM | POA: Diagnosis not present

## 2018-02-12 DIAGNOSIS — M1A9XX0 Chronic gout, unspecified, without tophus (tophi): Secondary | ICD-10-CM

## 2018-02-12 DIAGNOSIS — E782 Mixed hyperlipidemia: Secondary | ICD-10-CM | POA: Diagnosis not present

## 2018-02-12 DIAGNOSIS — I781 Nevus, non-neoplastic: Secondary | ICD-10-CM

## 2018-02-12 DIAGNOSIS — E669 Obesity, unspecified: Secondary | ICD-10-CM

## 2018-02-12 MED ORDER — SIMVASTATIN 10 MG PO TABS: ORAL_TABLET | ORAL | 1 refills | Status: DC

## 2018-02-12 NOTE — Assessment & Plan Note (Signed)
Well controlled, no changes to meds. Encouraged heart healthy diet such as the DASH diet and exercise as tolerated.  °

## 2018-02-12 NOTE — Assessment & Plan Note (Signed)
hgba1c acceptable, minimize simple carbs. Increase exercise as tolerated. Continue current meds 

## 2018-02-12 NOTE — Progress Notes (Signed)
Subjective:  I acted as a Education administrator for Dr. Charlett Blake. [place], [place]  Patient ID: Evan Best, male    DOB: Oct 26, 1950, 67 y.o.   MRN: [id number]  Chief Complaint  Patient presents with  . Follow-up    HPI  Patient is in today for 6 month follow up and overall he is doing well. No recent febrile illness or hospitalizations. No polyuria or polydipsia c/o. He is tying to maintain a heart healthy diet and stay active. He is concerned about a lesion on his back. Asymptomatic. Denies CP/palp/SOB/HA/congestion/fevers/GI or GU c/o. Taking meds as prescribed  Patient Care Team: [name], MD as PCP - General (Family Medicine) Clent Jacks, MD as Consulting Physician (Ophthalmology)   Past Medical History:  Diagnosis Date  . Allergic state 09/22/2013  . Anemia 09/07/2015  . Arthritis 03/06/2015  . Diabetes mellitus type II 1/09  . Dysphagia 05/04/2017  . Esophageal reflux 03/06/2015  . Gout 09/18/2012   Left big toe  . Hypertension   . Insomnia 09/13/2015  . Migraine headache    remote history  . OSA (obstructive sleep apnea)   . Preventative health care 09/05/2014  . Right shoulder pain 02/19/2009   Qualifier: Diagnosis of  By: Wynona Luna   . RLS (restless legs syndrome) 09/07/2015  . Sleep apnea    uses cpap  . Tremor 09/07/2015    Past Surgical History:  Procedure Laterality Date  . HERNIA REPAIR  8338   umbilical hernia repair  . KNEE SURGERY     left knee surgery  . TONSILLECTOMY  1959  . VASECTOMY      Family History  Problem Relation Age of Onset  . Breast cancer Mother   . Hypertension Mother   . Hyperlipidemia Father   . Kidney disease Father   . Heart disease Father   . Hypertension Father   . Colon cancer Neg Hx   . Stomach cancer Neg Hx     Social History   Socioeconomic History  . Marital status: Married    Spouse name: Not on file  . Number of children: Not on file  . Years of education: Not on file  . Highest education level: Not on file    Occupational History  . Not on file  Social Needs  . Financial resource strain: Not on file  . Food insecurity:    Worry: Not on file    Inability: Not on file  . Transportation needs:    Medical: Not on file    Non-medical: Not on file  Tobacco Use  . Smoking status: Never Smoker  . Smokeless tobacco: Never Used  Substance and Sexual Activity  . Alcohol use: No  . Drug use: No  . Sexual activity: Not on file  Lifestyle  . Physical activity:    Days per week: Not on file    Minutes per session: Not on file  . Stress: Not on file  Relationships  . Social connections:    Talks on phone: Not on file    Gets together: Not on file    Attends religious service: Not on file    Active member of club or organization: Not on file    Attends meetings of clubs or organizations: Not on file    Relationship status: Not on file  . Intimate partner violence:    Fear of current or ex partner: Not on file    Emotionally abused: Not on file  Physically abused: Not on file    Forced sexual activity: Not on file  Other Topics Concern  . Not on file  Social History Narrative   Occupation:  Chemical engineer   Married  -  1 daughter (age 34)    Never Smoked   Alcohol use-no     Drug use-no           Outpatient Medications Prior to Visit  Medication Sig Dispense Refill  . amLODipine (NORVASC) 10 MG tablet TAKE 1 TABLET BY MOUTH EVERY DAY 90 tablet 1  . Calcium-Magnesium-Zinc 1000-400-15 MG TABS Take by mouth daily.      . cetirizine (ZYRTEC) 10 MG tablet Take 10 mg by mouth daily.    . Chelated Potassium 99 MG TABS Take 99 mg by mouth 2 (two) times daily.     . Cinnamon 500 MG capsule Take 500 mg 3 (three) times daily by mouth. +50 mg Chromium in each supplement.    . Coenzyme Q10 (CO Q-10) 100 MG CAPS Take 200 mg daily by mouth.     . Cyanocobalamin (VITAMIN B 12 PO) Take 1 capsule daily by mouth.    . fluticasone (FLONASE) 50 MCG/ACT nasal spray Place 2 sprays into both  nostrils daily. 16 g 6  . Glucosamine-Chondroitin-Vit D3 1500-1200-800 MG-MG-UNIT PACK Take by mouth.    Marland Kitchen lisinopril (PRINIVIL,ZESTRIL) 40 MG tablet Take 1 tablet (40 mg total) by mouth daily. 90 tablet 1  . metFORMIN (GLUCOPHAGE) 500 MG tablet TAKE 2 TABLETS (1,000 MG TOTAL) BY MOUTH 2 (TWO) TIMES DAILY WITH A MEAL. 360 tablet 1  . Omega-3 Fatty Acids (FISH OIL PO) Take 1,500 mg by mouth daily.      . Quercetin 250 MG TABS Take by mouth 2 (two) times daily.     . ranitidine (ZANTAC) 300 MG tablet TAKE 1 TABLET BY MOUTH EVERYDAY AT BEDTIME 90 tablet 0  . vitamin C (ASCORBIC ACID) 500 MG tablet Take 500 mg by mouth 3 (three) times daily.     Marland Kitchen OVER THE COUNTER MEDICATION Phosphatidyl choline- 1 capsule bid    . simvastatin (ZOCOR) 10 MG tablet TAKE 1 TABLET BY MOUTH EVERYDAY AT BEDTIME 90 tablet 0  . simvastatin (ZOCOR) 10 MG tablet TAKE 1 TABLET BY MOUTH EVERYDAY AT BEDTIME 90 tablet 1   No facility-administered medications prior to visit.     No Known Allergies  Review of Systems  Constitutional: Negative for fever and malaise/fatigue.  HENT: Negative for congestion.   Eyes: Negative for blurred vision.  Respiratory: Negative for shortness of breath.   Cardiovascular: Negative for chest pain, palpitations and leg swelling.  Gastrointestinal: Negative for abdominal pain, blood in stool and nausea.  Genitourinary: Negative for dysuria and frequency.  Musculoskeletal: Negative for falls.  Skin: Negative for rash.  Neurological: Negative for dizziness, loss of consciousness and headaches.  Endo/Heme/Allergies: Negative for environmental allergies.  Psychiatric/Behavioral: Negative for depression. The patient is not nervous/anxious.        Objective:    Physical Exam  Constitutional: He is oriented to person, place, and time. He appears well-developed and well-nourished. No distress.  HENT:  Head: Normocephalic and atraumatic.  Nose: Nose normal.  Eyes: Right eye exhibits no  discharge. Left eye exhibits no discharge.  Neck: Normal range of motion. Neck supple.  Cardiovascular: Normal rate and regular rhythm.  No murmur heard. Pulmonary/Chest: Effort normal and breath sounds normal.  Abdominal: Soft. Bowel sounds are normal. There is no tenderness.  Musculoskeletal: He  exhibits no edema.  Neurological: He is alert and oriented to person, place, and time.  Skin: Skin is warm and dry.  5 mm angioma on low back toward midline.   Psychiatric: He has a normal mood and affect.  Nursing note and vitals reviewed.   BP 128/70 (BP Location: Left Arm, Patient Position: Sitting, Cuff Size: Normal)   Pulse 87   Temp 97.6 F (36.4 C) (Oral)   Resp 18   Ht 5\' 11"  (1.803 m)   Wt 215 lb 3.2 oz (97.6 kg)   SpO2 97%   BMI 30.01 kg/m  Wt Readings from Last 3 Encounters:  02/12/18 215 lb 3.2 oz (97.6 kg)  08/24/17 212 lb (96.2 kg)  08/10/17 209 lb 12.8 oz (95.2 kg)   BP Readings from Last 3 Encounters:  02/12/18 128/70  08/24/17 128/70  08/10/17 (!) 148/71     Immunization History  Administered Date(s) Administered  . Influenza Split 05/13/2011, 05/11/2012  . Influenza Whole 05/13/2009, 05/14/2010, 04/10/2013  . Influenza, High Dose Seasonal PF 05/04/2017  . Influenza-Unspecified 04/11/2014, 04/17/2015  . Pneumococcal Conjugate-13 05/04/2017  . Tdap 06/21/2011    Health Maintenance  Topic Date Due  . FOOT EXAM  03/05/2016  . INFLUENZA VACCINE  02/08/2018  . PNA vac Low Risk Adult (2 of 2 - PPSV23) 05/04/2018  . HEMOGLOBIN A1C  08/11/2018  . OPHTHALMOLOGY EXAM  10/12/2018  . TETANUS/TDAP  06/20/2021  . COLONOSCOPY  07/07/2022  . Hepatitis C Screening  Completed    Lab Results  Component Value Date   WBC 7.0 02/08/2018   HGB 14.7 02/08/2018   HCT 41.7 02/08/2018   PLT 294.0 02/08/2018   GLUCOSE 167 (H) 02/08/2018   CHOL 120 02/08/2018   TRIG 199.0 (H) 02/08/2018   HDL 29.10 (L) 02/08/2018   LDLDIRECT 74.0 08/03/2017   LDLCALC 51 02/08/2018    ALT 35 02/08/2018   AST 21 02/08/2018   NA 139 02/08/2018   K 4.3 02/08/2018   CL 101 02/08/2018   CREATININE 1.09 02/08/2018   BUN 17 02/08/2018   CO2 31 02/08/2018   TSH 1.83 02/08/2018   PSA 0.61 02/08/2018   HGBA1C 7.4 (H) 02/08/2018   MICROALBUR 1.66 03/15/2013    Lab Results  Component Value Date   TSH 1.83 02/08/2018   Lab Results  Component Value Date   WBC 7.0 02/08/2018   HGB 14.7 02/08/2018   HCT 41.7 02/08/2018   MCV 80.4 02/08/2018   PLT 294.0 02/08/2018   Lab Results  Component Value Date   NA 139 02/08/2018   K 4.3 02/08/2018   CO2 31 02/08/2018   GLUCOSE 167 (H) 02/08/2018   BUN 17 02/08/2018   CREATININE 1.09 02/08/2018   BILITOT 0.8 02/08/2018   ALKPHOS 61 02/08/2018   AST 21 02/08/2018   ALT 35 02/08/2018   PROT 7.0 02/08/2018   ALBUMIN 4.1 02/08/2018   CALCIUM 9.4 02/08/2018   GFR 71.90 02/08/2018   Lab Results  Component Value Date   CHOL 120 02/08/2018   Lab Results  Component Value Date   HDL 29.10 (L) 02/08/2018   Lab Results  Component Value Date   LDLCALC 51 02/08/2018   Lab Results  Component Value Date   TRIG 199.0 (H) 02/08/2018   Lab Results  Component Value Date   CHOLHDL 4 02/08/2018   Lab Results  Component Value Date   HGBA1C 7.4 (H) 02/08/2018         Assessment & Plan:  Problem List Items Addressed This Visit    Hyperlipidemia, mixed    Encouraged heart healthy diet, increase exercise, avoid trans fats, consider a krill oil cap daily      Relevant Medications   simvastatin (ZOCOR) 10 MG tablet   Other Relevant Orders   Lipid panel   Essential hypertension    Well controlled, no changes to meds. Encouraged heart healthy diet such as the DASH diet and exercise as tolerated.       Relevant Medications   simvastatin (ZOCOR) 10 MG tablet   Other Relevant Orders   CBC   Comprehensive metabolic panel   TSH   Diabetes mellitus type 2 in obese (HCC)    hgba1c acceptable, minimize simple carbs.  Increase exercise as tolerated. Continue current meds      Relevant Medications   simvastatin (ZOCOR) 10 MG tablet   Other Relevant Orders   Hemoglobin A1c   Gout    Check uric acid and monitor      Cherry angioma    On low back. No concerning lesion identified.       Relevant Medications   simvastatin (ZOCOR) 10 MG tablet      I have discontinued [employer] [facility]'s OVER THE COUNTER MEDICATION. I am also having him maintain his Calcium-Magnesium-Zinc, Chelated Potassium, Cinnamon, Omega-3 Fatty Acids (FISH OIL PO), vitamin C, Co Q-10, fluticasone, cetirizine, Quercetin, Cyanocobalamin (VITAMIN B 12 PO), Glucosamine-Chondroitin-Vit D3, lisinopril, ranitidine, metFORMIN, amLODipine, and simvastatin.  Meds ordered this encounter  Medications  . simvastatin (ZOCOR) 10 MG tablet    Sig: TAKE 1 TABLET BY MOUTH EVERYDAY AT BEDTIME    Dispense:  90 tablet    Refill:  1    CMA served as scribe during this visit. History, Physical and Plan performed by medical provider. Documentation and orders reviewed and attested to.  Penni Homans, MD

## 2018-02-12 NOTE — Assessment & Plan Note (Signed)
Encouraged heart healthy diet, increase exercise, avoid trans fats, consider a krill oil cap daily 

## 2018-02-12 NOTE — Patient Instructions (Signed)
Shingrix is the new shingles shot 2 shots over 2-6 months at pharmacy Hypertension Hypertension is another name for high blood pressure. High blood pressure forces your heart to work harder to pump blood. This can cause problems over time. There are two numbers in a blood pressure reading. There is a top number (systolic) over a bottom number (diastolic). It is best to have a blood pressure below 120/80. Healthy choices can help lower your blood pressure. You may need medicine to help lower your blood pressure if:  Your blood pressure cannot be lowered with healthy choices.  Your blood pressure is higher than 130/80.  Follow these instructions at home: Eating and drinking  If directed, follow the DASH eating plan. This diet includes: ? Filling half of your plate at each meal with fruits and vegetables. ? Filling one quarter of your plate at each meal with whole grains. Whole grains include whole wheat pasta, brown rice, and whole grain bread. ? Eating or drinking low-fat dairy products, such as skim milk or low-fat yogurt. ? Filling one quarter of your plate at each meal with low-fat (lean) proteins. Low-fat proteins include fish, skinless chicken, eggs, beans, and tofu. ? Avoiding fatty meat, cured and processed meat, or chicken with skin. ? Avoiding premade or processed food.  Eat less than 1,500 mg of salt (sodium) a day.  Limit alcohol use to no more than 1 drink a day for nonpregnant women and 2 drinks a day for men. One drink equals 12 oz of beer, 5 oz of wine, or 1 oz of hard liquor. Lifestyle  Work with your doctor to stay at a healthy weight or to lose weight. Ask your doctor what the best weight is for you.  Get at least 30 minutes of exercise that causes your heart to beat faster (aerobic exercise) most days of the week. This may include walking, swimming, or biking.  Get at least 30 minutes of exercise that strengthens your muscles (resistance exercise) at least 3 days a  week. This may include lifting weights or pilates.  Do not use any products that contain nicotine or tobacco. This includes cigarettes and e-cigarettes. If you need help quitting, ask your doctor.  Check your blood pressure at home as told by your doctor.  Keep all follow-up visits as told by your doctor. This is important. Medicines  Take over-the-counter and prescription medicines only as told by your doctor. Follow directions carefully.  Do not skip doses of blood pressure medicine. The medicine does not work as well if you skip doses. Skipping doses also puts you at risk for problems.  Ask your doctor about side effects or reactions to medicines that you should watch for. Contact a doctor if:  You think you are having a reaction to the medicine you are taking.  You have headaches that keep coming back (recurring).  You feel dizzy.  You have swelling in your ankles.  You have trouble with your vision. Get help right away if:  You get a very bad headache.  You start to feel confused.  You feel weak or numb.  You feel faint.  You get very bad pain in your: ? Chest. ? Belly (abdomen).  You throw up (vomit) more than once.  You have trouble breathing. Summary  Hypertension is another name for high blood pressure.  Making healthy choices can help lower blood pressure. If your blood pressure cannot be controlled with healthy choices, you may need to take medicine. This information  This information is not intended to replace advice given to you by your health care provider. Make sure you discuss any questions you have with your health care provider. Document Released: 12/14/2007 Document Revised: 05/25/2016 Document Reviewed: 05/25/2016 Elsevier Interactive Patient Education  2018 [employer]  

## 2018-02-12 NOTE — Assessment & Plan Note (Addendum)
Check uric acid and monitor

## 2018-02-14 DIAGNOSIS — I781 Nevus, non-neoplastic: Secondary | ICD-10-CM

## 2018-02-14 DIAGNOSIS — D1801 Hemangioma of skin and subcutaneous tissue: Secondary | ICD-10-CM | POA: Insufficient documentation

## 2018-02-14 NOTE — Assessment & Plan Note (Signed)
On low back. No concerning lesion identified.

## 2018-02-19 ENCOUNTER — Encounter: Payer: Self-pay | Admitting: *Deleted

## 2018-02-19 ENCOUNTER — Other Ambulatory Visit: Payer: Self-pay | Admitting: Family Medicine

## 2018-03-14 NOTE — Progress Notes (Addendum)
Subjective:   Evan Best is a 67 y.o. male who presents for an Initial Medicare Annual Wellness Visit.  Pt is retired Optometrist. Likes to go line and check the news  Review of Systems No ROS.  Medicare Wellness Visit. Additional risk factors are reflected in the social history.  Cardiac Risk Factors include: advanced age (>26men, >26 women);diabetes mellitus;dyslipidemia;hypertension;male gender;obesity (BMI >30kg/m2);sedentary lifestyle Home Safety/Smoke Alarms: Feels safe in home. Smoke alarms in place. Lives in 3 story home with wife and daughter.  Male:   CCS- last 07/07/17. Recall 5 yrs     PSA-  Lab Results  Component Value Date   PSA 0.61 02/08/2018   PSA 0.53 06/17/2013   PSA 0.45 08/10/2007      Objective:    Today's Vitals   03/15/18 1436  BP: (!) 142/88  Pulse: 96  SpO2: 97%  Weight: 215 lb (97.5 kg)  Height: 5\' 11"  (1.803 m)   Body mass index is 29.99 kg/m.  Advanced Directives 03/15/2018 03/15/2018  Does Patient Have a Medical Advance Directive? No Yes  Does patient want to make changes to medical advance directive? No - Patient declined -    Current Medications (verified) Outpatient Encounter Medications as of 03/15/2018  Medication Sig  . amLODipine (NORVASC) 10 MG tablet TAKE 1 TABLET BY MOUTH EVERY DAY  . Calcium-Magnesium-Zinc 1000-400-15 MG TABS Take by mouth daily.    . cetirizine (ZYRTEC) 10 MG tablet Take 10 mg by mouth daily.  . Chelated Potassium 99 MG TABS Take 99 mg by mouth 2 (two) times daily.   . Cinnamon 500 MG capsule Take 500 mg 3 (three) times daily by mouth. +50 mg Chromium in each supplement.  . Coenzyme Q10 (CO Q-10) 100 MG CAPS Take 200 mg daily by mouth.   . Cyanocobalamin (VITAMIN B 12 PO) Take 1 capsule daily by mouth.  . fluticasone (FLONASE) 50 MCG/ACT nasal spray Place 2 sprays into both nostrils daily.  . Glucosamine-Chondroitin-Vit D3 1500-1200-800 MG-MG-UNIT PACK Take by mouth.  Marland Kitchen lisinopril (PRINIVIL,ZESTRIL) 40  MG tablet Take 1 tablet (40 mg total) by mouth daily.  . metFORMIN (GLUCOPHAGE) 500 MG tablet TAKE 2 TABLETS (1,000 MG TOTAL) BY MOUTH 2 (TWO) TIMES DAILY WITH A MEAL.  Marland Kitchen Omega-3 Fatty Acids (FISH OIL PO) Take 1,500 mg by mouth daily.    . Quercetin 250 MG TABS Take by mouth 2 (two) times daily.   . ranitidine (ZANTAC) 300 MG tablet Take 1 tablet (300 mg total) by mouth at bedtime.  . simvastatin (ZOCOR) 10 MG tablet TAKE 1 TABLET BY MOUTH EVERYDAY AT BEDTIME  . vitamin C (ASCORBIC ACID) 500 MG tablet Take 500 mg by mouth 3 (three) times daily.    No facility-administered encounter medications on file as of 03/15/2018.     Allergies (verified) Patient has no known allergies.   History: Past Medical History:  Diagnosis Date  . Allergic state 09/22/2013  . Anemia 09/07/2015  . Arthritis 03/06/2015  . Diabetes mellitus type II 1/09  . Dysphagia 05/04/2017  . Esophageal reflux 03/06/2015  . Gout 09/18/2012   Left big toe  . Hypertension   . Insomnia 09/13/2015  . Migraine headache    remote history  . OSA (obstructive sleep apnea)   . Preventative health care 09/05/2014  . Right shoulder pain 02/19/2009   Qualifier: Diagnosis of  By: Wynona Luna   . RLS (restless legs syndrome) 09/07/2015  . Sleep apnea    uses cpap  .  Tremor 09/07/2015   Past Surgical History:  Procedure Laterality Date  . HERNIA REPAIR  7353   umbilical hernia repair  . KNEE SURGERY     left knee surgery  . TONSILLECTOMY  1959  . VASECTOMY     Family History  Problem Relation Age of Onset  . Breast cancer Mother   . Hypertension Mother   . Hyperlipidemia Father   . Kidney disease Father   . Heart disease Father   . Hypertension Father   . Colon cancer Neg Hx   . Stomach cancer Neg Hx    Social History   Socioeconomic History  . Marital status: Married    Spouse name: Not on file  . Number of children: Not on file  . Years of education: Not on file  . Highest education level: Not on file    Occupational History  . Not on file  Social Needs  . Financial resource strain: Not on file  . Food insecurity:    Worry: Not on file    Inability: Not on file  . Transportation needs:    Medical: Not on file    Non-medical: Not on file  Tobacco Use  . Smoking status: Never Smoker  . Smokeless tobacco: Never Used  Substance and Sexual Activity  . Alcohol use: No  . Drug use: No  . Sexual activity: Yes  Lifestyle  . Physical activity:    Days per week: Not on file    Minutes per session: Not on file  . Stress: Not on file  Relationships  . Social connections:    Talks on phone: Not on file    Gets together: Not on file    Attends religious service: Not on file    Active member of club or organization: Not on file    Attends meetings of clubs or organizations: Not on file    Relationship status: Not on file  Other Topics Concern  . Not on file  Social History Narrative   Occupation:  Chemical engineer   Married  -  1 daughter (age 72)    Never Smoked   Alcohol use-no     Drug use-no          Tobacco Counseling Counseling given: Not Answered   Clinical Intake:     Pain : No/denies pain                 Activities of Daily Living In your present state of health, do you have any difficulty performing the following activities: 03/15/2018  Hearing? N  Vision? N  Difficulty concentrating or making decisions? N  Walking or climbing stairs? N  Dressing or bathing? N  Doing errands, shopping? N  Preparing Food and eating ? N  Using the Toilet? N  In the past six months, have you accidently leaked urine? N  Do you have problems with loss of bowel control? N  Managing your Medications? N  Managing your Finances? N  Housekeeping or managing your Housekeeping? N  Some recent data might be hidden     Immunizations and Health Maintenance Immunization History  Administered Date(s) Administered  . Influenza Split 05/13/2011, 05/11/2012  . Influenza  Whole 05/13/2009, 05/14/2010, 04/10/2013  . Influenza, High Dose Seasonal PF 05/04/2017  . Influenza-Unspecified 04/11/2014, 04/17/2015  . Pneumococcal Conjugate-13 05/04/2017  . Tdap 06/21/2011  . Zoster Recombinat (Shingrix) 02/15/2018   Health Maintenance Due  Topic Date Due  . FOOT EXAM  03/05/2016  . INFLUENZA  VACCINE  02/08/2018    Patient Care Team: [name], MD as PCP - General (Family Medicine) Clent Jacks, MD as Consulting Physician (Ophthalmology)  Indicate any recent Medical Services you may have received from other than [facility] providers in the past year (date may be approximate).    Assessment:   This is a routine wellness examination for Evan Best. Physical assessment deferred to PCP.  Hearing/Vision screen  Visual Acuity Screening   Right eye Left eye Both eyes  Without correction: 20/25 20/25 20/25   With correction:     Hearing Screening Comments: Able to hear conversational tones w/o difficulty. No issues reported.    Dietary issues and exercise activities discussed: Current Exercise Habits: The patient does not participate in regular exercise at present Diet (meal preparation, eat out, water intake, caffeinated beverages, dairy products, fruits and vegetables):  Breakfast: greek yogurt and PB crackers, coffee Lunch: sandwich and nuts Dinner: popcorn and protein bars     Goals    . Maintain current health (pt-stated)      Depression Screen PHQ 2/9 Scores 03/15/2018  PHQ - 2 Score 0    Fall Risk Fall Risk  03/15/2018  Falls in the past year? Yes  Number falls in past yr: 1  Injury with Fall? No  Follow up Education provided;Falls prevention discussed     Cognitive Function: Ad8 score reviewed for issues:  Issues making decisions:no  Less interest in hobbies / activities:no  Repeats questions, stories (family complaining):no  Trouble using ordinary gadgets (microwave, computer, phone):no  Forgets the month or year: no  Mismanaging  finances: no  Remembering appts:no  Daily problems with thinking and/or memory:no Ad8 score is=0        Screening Tests Health Maintenance  Topic Date Due  . FOOT EXAM  03/05/2016  . INFLUENZA VACCINE  02/08/2018  . PNA vac Low Risk Adult (2 of 2 - PPSV23) 05/04/2018  . HEMOGLOBIN A1C  08/11/2018  . OPHTHALMOLOGY EXAM  10/12/2018  . TETANUS/TDAP  06/20/2021  . COLONOSCOPY  07/07/2022  . Hepatitis C Screening  Completed         Plan:     Please schedule your next medicare wellness visit with me in 1 yr.  Continue to eat heart healthy diet (full of fruits, vegetables, whole grains, lean protein, water--limit salt, fat, and sugar intake) and increase physical activity as tolerated.    I have personally reviewed and noted the following in the patient's chart:   . Medical and social history . Use of alcohol, tobacco or illicit drugs  . Current medications and supplements . Functional ability and status . Nutritional status . Physical activity . Advanced directives . List of other physicians . Hospitalizations, surgeries, and ER visits in previous 12 months . Vitals . Screenings to include cognitive, depression, and falls . Referrals and appointments  In addition, I have reviewed and discussed with patient certain preventive protocols, quality metrics, and best practice recommendations. A written personalized care plan for preventive services as well as general preventive health recommendations were provided to patient.     Shela Nevin, [place]   03/15/2018     Medical screening examination/treatment was performed by qualified clinical staff member and as supervising physician I was immediately available for consultation/collaboration. I have reviewed documentation and agree with assessment and plan.  Penni Homans, MD

## 2018-03-15 ENCOUNTER — Encounter: Payer: Self-pay | Admitting: *Deleted

## 2018-03-15 ENCOUNTER — Ambulatory Visit (INDEPENDENT_AMBULATORY_CARE_PROVIDER_SITE_OTHER): Payer: Medicare Other | Admitting: *Deleted

## 2018-03-15 VITALS — BP 142/88 | HR 96 | Ht 71.0 in | Wt 215.0 lb

## 2018-03-15 DIAGNOSIS — Z0000 Encounter for general adult medical examination without abnormal findings: Secondary | ICD-10-CM | POA: Diagnosis not present

## 2018-03-15 NOTE — Patient Instructions (Signed)
Please schedule your next medicare wellness visit with me in 1 yr.  Continue to eat heart healthy diet (full of fruits, vegetables, whole grains, lean protein, water--limit salt, fat, and sugar intake) and increase physical activity as tolerated.   Evan Best , Thank you for taking time to come for your Medicare Wellness Visit. I appreciate your ongoing commitment to your health goals. Please review the following plan we discussed and let me know if I can assist you in the future.   These are the goals we discussed: Goals    . Maintain current health (pt-stated)       This is a list of the screening recommended for you and due dates:  Health Maintenance  Topic Date Due  . Complete foot exam   03/05/2016  . Flu Shot  02/08/2018  . Pneumonia vaccines (2 of 2 - PPSV23) 05/04/2018  . Hemoglobin A1C  08/11/2018  . Eye exam for diabetics  10/12/2018  . Tetanus Vaccine  06/20/2021  . Colon Cancer Screening  07/07/2022  .  Hepatitis C: One time screening is recommended by Center for Disease Control  (CDC) for  adults born from [birth year] through 1964.   Completed    Health Maintenance, Male A healthy lifestyle and preventive care is important for your health and wellness. Ask your health care provider about what schedule of regular examinations is right for you. What should I know about weight and diet? Eat a Healthy Diet  Eat plenty of vegetables, fruits, whole grains, low-fat dairy products, and lean protein.  Do not eat a lot of foods high in solid fats, added sugars, or salt.  Maintain a Healthy Weight Regular exercise can help you achieve or maintain a healthy weight. You should:  Do at least 150 minutes of exercise each week. The exercise should increase your heart rate and make you sweat (moderate-intensity exercise).  Do strength-training exercises at least twice a week.  Watch Your Levels of Cholesterol and Blood Lipids  Have your blood tested for lipids and cholesterol every 5  years starting at 66 years of age. If you are at high risk for heart disease, you should start having your blood tested when you are 66 years old. You may need to have your cholesterol levels checked more often if: ? Your lipid or cholesterol levels are high. ? You are older than 66 years of age. ? You are at high risk for heart disease.  What should I know about cancer screening? Many types of cancers can be detected early and may often be prevented. Lung Cancer  You should be screened every year for lung cancer if: ? You are a current smoker who has smoked for at least 30 years. ? You are a former smoker who has quit within the past 15 years.  Talk to your health care provider about your screening options, when you should start screening, and how often you should be screened.  Colorectal Cancer  Routine colorectal cancer screening usually begins at 66 years of age and should be repeated every 5-10 years until you are 66 years old. You may need to be screened more often if early forms of precancerous polyps or small growths are found. Your health care provider may recommend screening at an earlier age if you have risk factors for colon cancer.  Your health care provider may recommend using home test kits to check for hidden blood in the stool.  A small camera at the end of a tube  can be used to examine your colon (sigmoidoscopy or colonoscopy). This checks for the earliest forms of colorectal cancer.  Prostate and Testicular Cancer  Depending on your age and overall health, your health care provider may do certain tests to screen for prostate and testicular cancer.  Talk to your health care provider about any symptoms or concerns you have about testicular or prostate cancer.  Skin Cancer  Check your skin from head to toe regularly.  Tell your health care provider about any new moles or changes in moles, especially if: ? There is a change in a mole's size, shape, or color. ? You  have a mole that is larger than a pencil eraser.  Always use sunscreen. Apply sunscreen liberally and repeat throughout the day.  Protect yourself by wearing long sleeves, pants, a wide-brimmed hat, and sunglasses when outside.  What should I know about heart disease, diabetes, and high blood pressure?  If you are 88-66 years of age, have your blood pressure checked every 3-5 years. If you are 8 years of age or older, have your blood pressure checked every year. You should have your blood pressure measured twice-once when you are at a hospital or clinic, and once when you are not at a hospital or clinic. Record the average of the two measurements. To check your blood pressure when you are not at a hospital or clinic, you can use: ? An automated blood pressure machine at a pharmacy. ? A home blood pressure monitor.  Talk to your health care provider about your target blood pressure.  If you are between 8-22 years old, ask your health care provider if you should take aspirin to prevent heart disease.  Have regular diabetes screenings by checking your fasting blood sugar level. ? If you are at a normal weight and have a low risk for diabetes, have this test once every three years after the age of 98. ? If you are overweight and have a high risk for diabetes, consider being tested at a younger age or more often.  A one-time screening for abdominal aortic aneurysm (AAA) by ultrasound is recommended for men aged 63-75 years who are current or former smokers. What should I know about preventing infection? Hepatitis B If you have a higher risk for hepatitis B, you should be screened for this virus. Talk with your health care provider to find out if you are at risk for hepatitis B infection. Hepatitis C Blood testing is recommended for:  Everyone born from [birth year] through 1964.  Anyone with known risk factors for hepatitis C.  Sexually Transmitted Diseases (STDs)  You should be screened each  year for STDs including gonorrhea and chlamydia if: ? You are sexually active and are younger than 66 years of age. ? You are older than 66 years of age and your health care provider tells you that you are at risk for this type of infection. ? Your sexual activity has changed since you were last screened and you are at an increased risk for chlamydia or gonorrhea. Ask your health care provider if you are at risk.  Talk with your health care provider about whether you are at high risk of being infected with HIV. Your health care provider may recommend a prescription medicine to help prevent HIV infection.  What else can I do?  Schedule regular health, dental, and eye exams.  Stay current with your vaccines (immunizations).  Do not use any tobacco products, such as cigarettes, chewing tobacco, and  e-cigarettes. If you need help quitting, ask your health care provider.  Limit alcohol intake to no more than 2 drinks per day. One drink equals 12 ounces of beer, 5 ounces of wine, or 1 ounces of hard liquor.  Do not use street drugs.  Do not share needles.  Ask your health care provider for help if you need support or information about quitting drugs.  Tell your health care provider if you often feel depressed.  Tell your health care provider if you have ever been abused or do not feel safe at home. This information is not intended to replace advice given to you by your health care provider. Make sure you discuss any questions you have with your health care provider. Document Released: 12/24/2007 Document Revised: 02/24/2016 Document Reviewed: 03/31/2015 Elsevier Interactive Patient Education  [employer].

## 2018-03-27 DIAGNOSIS — D234 Other benign neoplasm of skin of scalp and neck: Secondary | ICD-10-CM | POA: Diagnosis not present

## 2018-03-27 DIAGNOSIS — D1801 Hemangioma of skin and subcutaneous tissue: Secondary | ICD-10-CM | POA: Diagnosis not present

## 2018-03-27 DIAGNOSIS — L7211 Pilar cyst: Secondary | ICD-10-CM | POA: Diagnosis not present

## 2018-03-27 DIAGNOSIS — I872 Venous insufficiency (chronic) (peripheral): Secondary | ICD-10-CM | POA: Diagnosis not present

## 2018-03-27 DIAGNOSIS — L814 Other melanin hyperpigmentation: Secondary | ICD-10-CM | POA: Diagnosis not present

## 2018-03-27 DIAGNOSIS — D225 Melanocytic nevi of trunk: Secondary | ICD-10-CM | POA: Diagnosis not present

## 2018-03-27 DIAGNOSIS — L821 Other seborrheic keratosis: Secondary | ICD-10-CM | POA: Diagnosis not present

## 2018-04-10 DIAGNOSIS — Z23 Encounter for immunization: Secondary | ICD-10-CM | POA: Diagnosis not present

## 2018-05-04 ENCOUNTER — Encounter: Payer: Self-pay | Admitting: Podiatry

## 2018-05-04 ENCOUNTER — Ambulatory Visit (INDEPENDENT_AMBULATORY_CARE_PROVIDER_SITE_OTHER): Payer: Medicare Other | Admitting: Podiatry

## 2018-05-04 DIAGNOSIS — M79675 Pain in left toe(s): Secondary | ICD-10-CM

## 2018-05-04 DIAGNOSIS — M79674 Pain in right toe(s): Secondary | ICD-10-CM | POA: Diagnosis not present

## 2018-05-04 DIAGNOSIS — B351 Tinea unguium: Secondary | ICD-10-CM | POA: Diagnosis not present

## 2018-05-18 ENCOUNTER — Other Ambulatory Visit (INDEPENDENT_AMBULATORY_CARE_PROVIDER_SITE_OTHER): Payer: Medicare Other

## 2018-05-18 DIAGNOSIS — E782 Mixed hyperlipidemia: Secondary | ICD-10-CM | POA: Diagnosis not present

## 2018-05-18 DIAGNOSIS — E669 Obesity, unspecified: Secondary | ICD-10-CM

## 2018-05-18 DIAGNOSIS — I10 Essential (primary) hypertension: Secondary | ICD-10-CM

## 2018-05-18 DIAGNOSIS — E1169 Type 2 diabetes mellitus with other specified complication: Secondary | ICD-10-CM

## 2018-05-18 LAB — COMPREHENSIVE METABOLIC PANEL
ALBUMIN: 4.3 g/dL (ref 3.5–5.2)
ALT: 27 U/L (ref 0–53)
AST: 19 U/L (ref 0–37)
Alkaline Phosphatase: 54 U/L (ref 39–117)
BUN: 19 mg/dL (ref 6–23)
CALCIUM: 9.2 mg/dL (ref 8.4–10.5)
CHLORIDE: 101 meq/L (ref 96–112)
CO2: 31 mEq/L (ref 19–32)
CREATININE: 1.19 mg/dL (ref 0.40–1.50)
GFR: 64.92 mL/min (ref >60.00)
Glucose, Bld: 161 mg/dL — ABNORMAL HIGH (ref 70–99)
POTASSIUM: 4.1 meq/L (ref 3.5–5.1)
Sodium: 139 mEq/L (ref 135–145)
Total Bilirubin: 0.6 mg/dL (ref 0.2–1.2)
Total Protein: 7.3 g/dL (ref 6.0–8.3)

## 2018-05-18 LAB — LIPID PANEL
CHOL/HDL RATIO: 4
Cholesterol: 109 mg/dL (ref 0–200)
HDL: 29 mg/dL — AB (ref >39.00)
LDL CALC: 49 mg/dL (ref 0–99)
NonHDL: 80.08
Triglycerides: 157 mg/dL — ABNORMAL HIGH (ref 0.0–149.0)
VLDL: 31.4 mg/dL (ref 0.0–40.0)

## 2018-05-18 LAB — CBC
HEMATOCRIT: 39.5 % (ref 39.0–52.0)
Hemoglobin: 13.3 g/dL (ref 13.0–17.0)
MCHC: 33.8 g/dL (ref 30.0–36.0)
MCV: 75.5 fl — AB (ref 78.0–100.0)
Platelets: 306 10*3/uL (ref 150.0–400.0)
RBC: 5.23 Mil/uL (ref 4.22–5.81)
RDW: 14.4 % (ref 11.5–15.5)
WBC: 7 10*3/uL (ref 4.0–10.5)

## 2018-05-18 LAB — HEMOGLOBIN A1C: Hgb A1c MFr Bld: 7.3 % — ABNORMAL HIGH (ref 4.6–6.5)

## 2018-05-18 LAB — TSH: TSH: 1.9 u[IU]/mL (ref 0.35–4.50)

## 2018-05-24 ENCOUNTER — Encounter: Payer: Self-pay | Admitting: Family Medicine

## 2018-05-24 ENCOUNTER — Ambulatory Visit (INDEPENDENT_AMBULATORY_CARE_PROVIDER_SITE_OTHER): Payer: Medicare Other | Admitting: Family Medicine

## 2018-05-24 DIAGNOSIS — E669 Obesity, unspecified: Secondary | ICD-10-CM | POA: Diagnosis not present

## 2018-05-24 DIAGNOSIS — Z23 Encounter for immunization: Secondary | ICD-10-CM | POA: Diagnosis not present

## 2018-05-24 DIAGNOSIS — E1169 Type 2 diabetes mellitus with other specified complication: Secondary | ICD-10-CM | POA: Diagnosis not present

## 2018-05-24 DIAGNOSIS — I10 Essential (primary) hypertension: Secondary | ICD-10-CM

## 2018-05-24 DIAGNOSIS — E782 Mixed hyperlipidemia: Secondary | ICD-10-CM

## 2018-05-24 DIAGNOSIS — M1A9XX0 Chronic gout, unspecified, without tophus (tophi): Secondary | ICD-10-CM | POA: Diagnosis not present

## 2018-05-24 DIAGNOSIS — T7840XD Allergy, unspecified, subsequent encounter: Secondary | ICD-10-CM

## 2018-05-24 NOTE — Assessment & Plan Note (Signed)
Tolerating statin, encouraged heart healthy diet, avoid trans fats, minimize simple carbs and saturated fats. Increase exercise as tolerated 

## 2018-05-24 NOTE — Assessment & Plan Note (Signed)
Well controlled, no changes to meds. Encouraged heart healthy diet such as the DASH diet and exercise as tolerated.  °

## 2018-05-24 NOTE — Patient Instructions (Addendum)
Need shingrix shot in next 3 months. By    Hypertension Hypertension is another name for high blood pressure. High blood pressure forces your heart to work harder to pump blood. This can cause problems over time. There are two numbers in a blood pressure reading. There is a top number (systolic) over a bottom number (diastolic). It is best to have a blood pressure below 120/80. Healthy choices can help lower your blood pressure. You may need medicine to help lower your blood pressure if:  Your blood pressure cannot be lowered with healthy choices.  Your blood pressure is higher than 130/80.  Follow these instructions at home: Eating and drinking  If directed, follow the DASH eating plan. This diet includes: ? Filling half of your plate at each meal with fruits and vegetables. ? Filling one quarter of your plate at each meal with whole grains. Whole grains include whole wheat pasta, brown rice, and whole grain bread. ? Eating or drinking low-fat dairy products, such as skim milk or low-fat yogurt. ? Filling one quarter of your plate at each meal with low-fat (lean) proteins. Low-fat proteins include fish, skinless chicken, eggs, beans, and tofu. ? Avoiding fatty meat, cured and processed meat, or chicken with skin. ? Avoiding premade or processed food.  Eat less than 1,500 mg of salt (sodium) a day.  Limit alcohol use to no more than 1 drink a day for nonpregnant women and 2 drinks a day for men. One drink equals 12 oz of beer, 5 oz of wine, or 1 oz of hard liquor. Lifestyle  Work with your doctor to stay at a healthy weight or to lose weight. Ask your doctor what the best weight is for you.  Get at least 30 minutes of exercise that causes your heart to beat faster (aerobic exercise) most days of the week. This may include walking, swimming, or biking.  Get at least 30 minutes of exercise that strengthens your muscles (resistance exercise) at least 3 days a week. This may include  lifting weights or pilates.  Do not use any products that contain nicotine or tobacco. This includes cigarettes and e-cigarettes. If you need help quitting, ask your doctor.  Check your blood pressure at home as told by your doctor.  Keep all follow-up visits as told by your doctor. This is important. Medicines  Take over-the-counter and prescription medicines only as told by your doctor. Follow directions carefully.  Do not skip doses of blood pressure medicine. The medicine does not work as well if you skip doses. Skipping doses also puts you at risk for problems.  Ask your doctor about side effects or reactions to medicines that you should watch for. Contact a doctor if:  You think you are having a reaction to the medicine you are taking.  You have headaches that keep coming back (recurring).  You feel dizzy.  You have swelling in your ankles.  You have trouble with your vision. Get help right away if:  You get a very bad headache.  You start to feel confused.  You feel weak or numb.  You feel faint.  You get very bad pain in your: ? Chest. ? Belly (abdomen).  You throw up (vomit) more than once.  You have trouble breathing. Summary  Hypertension is another name for high blood pressure.  Making healthy choices can help lower blood pressure. If your blood pressure cannot be controlled with healthy choices, you may need to take medicine. This information is not  intended to replace advice given to you by your health care provider. Make sure you discuss any questions you have with your health care provider. Document Released: 12/14/2007 Document Revised: 05/25/2016 Document Reviewed: 05/25/2016 Elsevier Interactive Patient Education  2018 [employer].  Carbohydrate Counting for Diabetes Mellitus, Adult Carbohydrate counting is a method for keeping track of how many carbohydrates you eat. Eating carbohydrates naturally increases the amount of sugar (glucose) in the  blood. Counting how many carbohydrates you eat helps keep your blood glucose within normal limits, which helps you manage your diabetes (diabetes mellitus). It is important to know how many carbohydrates you can safely have in each meal. This is different for every person. A diet and nutrition specialist (registered dietitian) can help you make a meal plan and calculate how many carbohydrates you should have at each meal and snack. Carbohydrates are found in the following foods:  Grains, such as breads and cereals.  Dried beans and soy products.  Starchy vegetables, such as potatoes, peas, and corn.  Fruit and fruit juices.  Milk and yogurt.  Sweets and snack foods, such as cake, cookies, candy, chips, and soft drinks.  How do I count carbohydrates? There are two ways to count carbohydrates in food. You can use either of the methods or a combination of both. Reading "Nutrition Facts" on packaged food The "Nutrition Facts" list is included on the labels of almost all packaged foods and beverages in the [place] It includes:  The serving size.  Information about nutrients in each serving, including the grams (g) of carbohydrate per serving.  To use the "Nutrition Facts":  Decide how many servings you will have.  Multiply the number of servings by the number of carbohydrates per serving.  The resulting number is the total amount of carbohydrates that you will be having.  Learning standard serving sizes of other foods When you eat foods containing carbohydrates that are not packaged or do not include "Nutrition Facts" on the label, you need to measure the servings in order to count the amount of carbohydrates:  Measure the foods that you will eat with a food scale or measuring cup, if needed.  Decide how many standard-size servings you will eat.  Multiply the number of servings by 15. Most carbohydrate-rich foods have about 15 g of carbohydrates per serving. ? For example, if you eat  8 oz (170 g) of strawberries, you will have eaten 2 servings and 30 g of carbohydrates (2 servings x 15 g = 30 g).  For foods that have more than one food mixed, such as soups and casseroles, you must count the carbohydrates in each food that is included.  The following list contains standard serving sizes of common carbohydrate-rich foods. Each of these servings has about 15 g of carbohydrates:   hamburger bun or  English muffin.   oz (15 mL) syrup.   oz (14 g) jelly.  1 slice of bread.  1 six-inch tortilla.  3 oz (85 g) cooked rice or pasta.  4 oz (113 g) cooked dried beans.  4 oz (113 g) starchy vegetable, such as peas, corn, or potatoes.  4 oz (113 g) hot cereal.  4 oz (113 g) mashed potatoes or  of a large baked potato.  4 oz (113 g) canned or frozen fruit.  4 oz (120 mL) fruit juice.  4-6 crackers.  6 chicken nuggets.  6 oz (170 g) unsweetened dry cereal.  6 oz (170 g) plain fat-free yogurt or yogurt  sweetened with artificial sweeteners.  8 oz (240 mL) milk.  8 oz (170 g) fresh fruit or one small piece of fruit.  24 oz (680 g) popped popcorn.  Example of carbohydrate counting Sample meal  3 oz (85 g) chicken breast.  6 oz (170 g) brown rice.  4 oz (113 g) corn.  8 oz (240 mL) milk.  8 oz (170 g) strawberries with sugar-free whipped topping. Carbohydrate calculation 1. Identify the foods that contain carbohydrates: ? Rice. ? Corn. ? Milk. ? Strawberries. 2. Calculate how many servings you have of each food: ? 2 servings rice. ? 1 serving corn. ? 1 serving milk. ? 1 serving strawberries. 3. Multiply each number of servings by 15 g: ? 2 servings rice x 15 g = 30 g. ? 1 serving corn x 15 g = 15 g. ? 1 serving milk x 15 g = 15 g. ? 1 serving strawberries x 15 g = 15 g. 4. Add together all of the amounts to find the total grams of carbohydrates eaten: ? 30 g + 15 g + 15 g + 15 g = 75 g of carbohydrates total. This information is not  intended to replace advice given to you by your health care provider. Make sure you discuss any questions you have with your health care provider. Document Released: 06/27/2005 Document Revised: 01/15/2016 Document Reviewed: 12/09/2015 Elsevier Interactive Patient Education  [employer].

## 2018-05-24 NOTE — Assessment & Plan Note (Signed)
Monitor uric acid and hydrate well

## 2018-05-24 NOTE — Assessment & Plan Note (Signed)
Struggles with symptoms year round is stable

## 2018-05-24 NOTE — Assessment & Plan Note (Signed)
hgba1c acceptable, minimize simple carbs. Increase exercise as tolerated.  

## 2018-05-26 ENCOUNTER — Encounter: Payer: Self-pay | Admitting: Podiatry

## 2018-05-26 NOTE — Progress Notes (Signed)
Subjective: Evan Best presents today for follow-up.  He presents with  cc of painful, discolored, thick toenails which interfere with daily activities and routine tasks.  Pain is aggravated when wearing enclosed shoe gear. Pain is getting progressively worse and relieved with periodic professional debridement.  Objective: Vascular Examination: Capillary refill time <3 seconds x 10 digits Dorsalis pedis and posterior tibial pulses present b/l No digital hair x 10 digits Skin temperature warm to warm b/l  Dermatological Examination: Skin with normal turgor texture and tone bilaterally No open wounds No interdigital macerations Toenails 1-5 b/l discolored, thick, dystrophic with subungual debris and pain with palpation to nailbeds due to thickness of nails.  Musculoskeletal: Muscle strength 5/5 to all LE muscle groups  Neurological: Sensation intact with 10 gram monofilament. Vibratory sensation intact.  Assessment: Painful onychomycosis toenails 1-5 b/l   Plan: 1. Toenails 1-5 b/l were debrided in length and girth without iatrogenic bleeding. 2. Patient to continue soft, supportive shoe gear 3. Patient to report any pedal injuries to medical professional immediately. 4. Follow up 3 months. Patient/POA to call should there be a concern in the interim.

## 2018-05-27 NOTE — Progress Notes (Signed)
Subjective:    Patient ID: Evan Best, male    DOB: Apr 12, 1951, 67 y.o.   MRN: [id number]  No chief complaint on file.   HPI Patient is in today for follow-up.  He feels well.  No recent febrile illness or acute hospitalizations.  He continues to stay active and trying maintain a heart healthy diet.  No polyuria or polydipsia is noted. BP adequate elsewhere. Denies CP/palp/SOB/HA/congestion/fevers/GI or GU c/o. Taking meds as prescribed  Past Medical History:  Diagnosis Date  . Allergic state 09/22/2013  . Anemia 09/07/2015  . Arthritis 03/06/2015  . Diabetes mellitus type II 1/09  . Dysphagia 05/04/2017  . Esophageal reflux 03/06/2015  . Gout 09/18/2012   Left big toe  . Hypertension   . Insomnia 09/13/2015  . Migraine headache    remote history  . OSA (obstructive sleep apnea)   . Preventative health care 09/05/2014  . Right shoulder pain 02/19/2009   Qualifier: Diagnosis of  By: Wynona Luna   . RLS (restless legs syndrome) 09/07/2015  . Sleep apnea    uses cpap  . Tremor 09/07/2015    Past Surgical History:  Procedure Laterality Date  . HERNIA REPAIR  8088   umbilical hernia repair  . KNEE SURGERY     left knee surgery  . TONSILLECTOMY  1959  . VASECTOMY      Family History  Problem Relation Age of Onset  . Breast cancer Mother   . Hypertension Mother   . Hyperlipidemia Father   . Kidney disease Father   . Heart disease Father   . Hypertension Father   . Colon cancer Neg Hx   . Stomach cancer Neg Hx     Social History   Socioeconomic History  . Marital status: Married    Spouse name: Not on file  . Number of children: Not on file  . Years of education: Not on file  . Highest education level: Not on file  Occupational History  . Not on file  Social Needs  . Financial resource strain: Not on file  . Food insecurity:    Worry: Not on file    Inability: Not on file  . Transportation needs:    Medical: Not on file    Non-medical: Not on file    Tobacco Use  . Smoking status: Never Smoker  . Smokeless tobacco: Never Used  Substance and Sexual Activity  . Alcohol use: No  . Drug use: No  . Sexual activity: Yes  Lifestyle  . Physical activity:    Days per week: Not on file    Minutes per session: Not on file  . Stress: Not on file  Relationships  . Social connections:    Talks on phone: Not on file    Gets together: Not on file    Attends religious service: Not on file    Active member of club or organization: Not on file    Attends meetings of clubs or organizations: Not on file    Relationship status: Not on file  . Intimate partner violence:    Fear of current or ex partner: Not on file    Emotionally abused: Not on file    Physically abused: Not on file    Forced sexual activity: Not on file  Other Topics Concern  . Not on file  Social History Narrative   Occupation:  Chemical engineer   Married  -  1 daughter (age 55)  Never Smoked   Alcohol use-no     Drug use-no           Outpatient Medications Prior to Visit  Medication Sig Dispense Refill  . amLODipine (NORVASC) 10 MG tablet TAKE 1 TABLET BY MOUTH EVERY DAY 90 tablet 1  . Calcium-Magnesium-Zinc 1000-400-15 MG TABS Take by mouth daily.      . cetirizine (ZYRTEC) 10 MG tablet Take 10 mg by mouth daily.    . Chelated Potassium 99 MG TABS Take 99 mg by mouth 2 (two) times daily.     . Cinnamon 500 MG capsule Take 500 mg 3 (three) times daily by mouth. +50 mg Chromium in each supplement.    . Coenzyme Q10 (CO Q-10) 100 MG CAPS Take 200 mg daily by mouth.     . Cyanocobalamin (VITAMIN B 12 PO) Take 1 capsule daily by mouth.    . fluticasone (FLONASE) 50 MCG/ACT nasal spray Place 2 sprays into both nostrils daily. 16 g 6  . Glucosamine-Chondroitin-Vit D3 1500-1200-800 MG-MG-UNIT PACK Take by mouth.    Marland Kitchen lisinopril (PRINIVIL,ZESTRIL) 40 MG tablet Take 1 tablet (40 mg total) by mouth daily. 90 tablet 1  . metFORMIN (GLUCOPHAGE) 500 MG tablet TAKE 2  TABLETS (1,000 MG TOTAL) BY MOUTH 2 (TWO) TIMES DAILY WITH A MEAL. 360 tablet 1  . Misc Natural Products (TART CHERRY ADVANCED PO) Take by mouth.    . Omega-3 Fatty Acids (FISH OIL PO) Take 1,500 mg by mouth daily.      . Quercetin 250 MG TABS Take by mouth 2 (two) times daily.     . simvastatin (ZOCOR) 10 MG tablet TAKE 1 TABLET BY MOUTH EVERYDAY AT BEDTIME 90 tablet 1  . TURMERIC PO Take by mouth.    . vitamin C (ASCORBIC ACID) 500 MG tablet Take 500 mg by mouth 3 (three) times daily.     . ranitidine (ZANTAC) 300 MG tablet Take 1 tablet (300 mg total) by mouth at bedtime. 90 tablet 3   No facility-administered medications prior to visit.     No Known Allergies  Review of Systems  Constitutional: Negative for fever and malaise/fatigue.  HENT: Negative for congestion.   Eyes: Negative for blurred vision.  Respiratory: Negative for shortness of breath.   Cardiovascular: Negative for chest pain, palpitations and leg swelling.  Gastrointestinal: Negative for abdominal pain, blood in stool and nausea.  Genitourinary: Negative for dysuria and frequency.  Musculoskeletal: Negative for falls.  Skin: Negative for rash.  Neurological: Negative for dizziness, loss of consciousness and headaches.  Endo/Heme/Allergies: Negative for environmental allergies.  Psychiatric/Behavioral: Negative for depression. The patient is not nervous/anxious.        Objective:    Physical Exam  Constitutional: He is oriented to person, place, and time. He appears well-developed and well-nourished. No distress.  HENT:  Head: Normocephalic and atraumatic.  Nose: Nose normal.  Eyes: Right eye exhibits no discharge. Left eye exhibits no discharge.  Neck: Normal range of motion. Neck supple.  Cardiovascular: Normal rate and regular rhythm.  No murmur heard. Pulmonary/Chest: Effort normal and breath sounds normal.  Abdominal: Soft. Bowel sounds are normal. There is no tenderness.  Musculoskeletal: He exhibits  no edema.  Neurological: He is alert and oriented to person, place, and time.  Skin: Skin is warm and dry.  Psychiatric: He has a normal mood and affect.  Nursing note and vitals reviewed.   BP (!) 142/78   Pulse 88   Temp 97.6 F (36.4  C) (Oral)   Resp 18   Wt 215 lb 3.2 oz (97.6 kg)   SpO2 98%   BMI 30.01 kg/m  Wt Readings from Last 3 Encounters:  05/24/18 215 lb 3.2 oz (97.6 kg)  03/15/18 215 lb (97.5 kg)  02/12/18 215 lb 3.2 oz (97.6 kg)     Lab Results  Component Value Date   WBC 7.0 05/18/2018   HGB 13.3 05/18/2018   HCT 39.5 05/18/2018   PLT 306.0 05/18/2018   GLUCOSE 161 (H) 05/18/2018   CHOL 109 05/18/2018   TRIG 157.0 (H) 05/18/2018   HDL 29.00 (L) 05/18/2018   LDLDIRECT 74.0 08/03/2017   LDLCALC 49 05/18/2018   ALT 27 05/18/2018   AST 19 05/18/2018   NA 139 05/18/2018   K 4.1 05/18/2018   CL 101 05/18/2018   CREATININE 1.19 05/18/2018   BUN 19 05/18/2018   CO2 31 05/18/2018   TSH 1.90 05/18/2018   PSA 0.61 02/08/2018   HGBA1C 7.3 (H) 05/18/2018   MICROALBUR 1.66 03/15/2013    Lab Results  Component Value Date   TSH 1.90 05/18/2018   Lab Results  Component Value Date   WBC 7.0 05/18/2018   HGB 13.3 05/18/2018   HCT 39.5 05/18/2018   MCV 75.5 (L) 05/18/2018   PLT 306.0 05/18/2018   Lab Results  Component Value Date   NA 139 05/18/2018   K 4.1 05/18/2018   CO2 31 05/18/2018   GLUCOSE 161 (H) 05/18/2018   BUN 19 05/18/2018   CREATININE 1.19 05/18/2018   BILITOT 0.6 05/18/2018   ALKPHOS 54 05/18/2018   AST 19 05/18/2018   ALT 27 05/18/2018   PROT 7.3 05/18/2018   ALBUMIN 4.3 05/18/2018   CALCIUM 9.2 05/18/2018   GFR 64.92 05/18/2018   Lab Results  Component Value Date   CHOL 109 05/18/2018   Lab Results  Component Value Date   HDL 29.00 (L) 05/18/2018   Lab Results  Component Value Date   LDLCALC 49 05/18/2018   Lab Results  Component Value Date   TRIG 157.0 (H) 05/18/2018   Lab Results  Component Value Date    CHOLHDL 4 05/18/2018   Lab Results  Component Value Date   HGBA1C 7.3 (H) 05/18/2018       Assessment & Plan:   Problem List Items Addressed This Visit    Hyperlipidemia, mixed    Tolerating statin, encouraged heart healthy diet, avoid trans fats, minimize simple carbs and saturated fats. Increase exercise as tolerated      Relevant Orders   Lipid panel   Essential hypertension    Well controlled, no changes to meds. Encouraged heart healthy diet such as the DASH diet and exercise as tolerated.       Relevant Orders   CBC   Comprehensive metabolic panel   TSH   Diabetes mellitus type 2 in obese (HCC)    hgba1c acceptable, minimize simple carbs. Increase exercise as tolerated.       Relevant Orders   Hemoglobin A1c   Gout    Monitor uric acid and hydrate well      Allergies    Struggles with symptoms year round is stable         I have discontinued Fabricio E. Ronning's ranitidine. I am also having him maintain his Calcium-Magnesium-Zinc, Chelated Potassium, Cinnamon, Omega-3 Fatty Acids (FISH OIL PO), vitamin C, Co Q-10, fluticasone, cetirizine, Quercetin, Cyanocobalamin (VITAMIN B 12 PO), Glucosamine-Chondroitin-Vit D3, lisinopril, metFORMIN, amLODipine, simvastatin, TURMERIC PO, and  Misc Natural Products (TART CHERRY ADVANCED PO).  No orders of the defined types were placed in this encounter.    Penni Homans, MD

## 2018-06-05 ENCOUNTER — Other Ambulatory Visit: Payer: Self-pay | Admitting: Family Medicine

## 2018-08-03 ENCOUNTER — Encounter: Payer: Self-pay | Admitting: Podiatry

## 2018-08-03 ENCOUNTER — Ambulatory Visit (INDEPENDENT_AMBULATORY_CARE_PROVIDER_SITE_OTHER): Payer: Medicare Other | Admitting: Podiatry

## 2018-08-03 VITALS — BP 157/85 | HR 84 | Resp 16

## 2018-08-03 DIAGNOSIS — M79674 Pain in right toe(s): Secondary | ICD-10-CM

## 2018-08-03 DIAGNOSIS — B351 Tinea unguium: Secondary | ICD-10-CM

## 2018-08-03 DIAGNOSIS — M79675 Pain in left toe(s): Secondary | ICD-10-CM

## 2018-08-03 NOTE — Patient Instructions (Signed)
Diabetes Mellitus and Foot Care  Foot care is an important part of your health, especially when you have diabetes. Diabetes may cause you to have problems because of poor blood flow (circulation) to your feet and legs, which can cause your skin to:   Become thinner and drier.   Break more easily.   Heal more slowly.   Peel and crack.  You may also have nerve damage (neuropathy) in your legs and feet, causing decreased feeling in them. This means that you may not notice minor injuries to your feet that could lead to more serious problems. Noticing and addressing any potential problems early is the best way to prevent future foot problems.  How to care for your feet  Foot hygiene   Wash your feet daily with warm water and mild soap. Do not use hot water. Then, pat your feet and the areas between your toes until they are completely dry. Do not soak your feet as this can dry your skin.   Trim your toenails straight across. Do not dig under them or around the cuticle. File the edges of your nails with an emery board or nail file.   Apply a moisturizing lotion or petroleum jelly to the skin on your feet and to dry, brittle toenails. Use lotion that does not contain alcohol and is unscented. Do not apply lotion between your toes.  Shoes and socks   Wear clean socks or stockings every day. Make sure they are not too tight. Do not wear knee-high stockings since they may decrease blood flow to your legs.   Wear shoes that fit properly and have enough cushioning. Always look in your shoes before you put them on to be sure there are no objects inside.   To break in new shoes, wear them for just a few hours a day. This prevents injuries on your feet.  Wounds, scrapes, corns, and calluses   Check your feet daily for blisters, cuts, bruises, sores, and redness. If you cannot see the bottom of your feet, use a mirror or ask someone for help.   Do not cut corns or calluses or try to remove them with medicine.   If you  find a minor scrape, cut, or break in the skin on your feet, keep it and the skin around it clean and dry. You may clean these areas with mild soap and water. Do not clean the area with peroxide, alcohol, or iodine.   If you have a wound, scrape, corn, or callus on your foot, look at it several times a day to make sure it is healing and not infected. Check for:  ? Redness, swelling, or pain.  ? Fluid or blood.  ? Warmth.  ? Pus or a bad smell.  General instructions   Do not cross your legs. This may decrease blood flow to your feet.   Do not use heating pads or hot water bottles on your feet. They may burn your skin. If you have lost feeling in your feet or legs, you may not know this is happening until it is too late.   Protect your feet from hot and cold by wearing shoes, such as at the beach or on hot pavement.   Schedule a complete foot exam at least once a year (annually) or more often if you have foot problems. If you have foot problems, report any cuts, sores, or bruises to your health care provider immediately.  Contact a health care provider if:     You have a medical condition that increases your risk of infection and you have any cuts, sores, or bruises on your feet.   You have an injury that is not healing.   You have redness on your legs or feet.   You feel burning or tingling in your legs or feet.   You have pain or cramps in your legs and feet.   Your legs or feet are numb.   Your feet always feel cold.   You have pain around a toenail.  Get help right away if:   You have a wound, scrape, corn, or callus on your foot and:  ? You have pain, swelling, or redness that gets worse.  ? You have fluid or blood coming from the wound, scrape, corn, or callus.  ? Your wound, scrape, corn, or callus feels warm to the touch.  ? You have pus or a bad smell coming from the wound, scrape, corn, or callus.  ? You have a fever.  ? You have a red line going up your leg.  Summary   Check your feet every day  for cuts, sores, red spots, swelling, and blisters.   Moisturize feet and legs daily.   Wear shoes that fit properly and have enough cushioning.   If you have foot problems, report any cuts, sores, or bruises to your health care provider immediately.   Schedule a complete foot exam at least once a year (annually) or more often if you have foot problems.  This information is not intended to replace advice given to you by your health care provider. Make sure you discuss any questions you have with your health care provider.  Document Released: 06/24/2000 Document Revised: 08/09/2017 Document Reviewed: 07/29/2016  Elsevier Interactive Patient Education  2019 [employer]

## 2018-08-09 ENCOUNTER — Other Ambulatory Visit: Payer: Self-pay | Admitting: Family Medicine

## 2018-08-16 ENCOUNTER — Encounter: Payer: Self-pay | Admitting: Podiatry

## 2018-08-16 NOTE — Progress Notes (Signed)
Subjective: Evan Best presents today with painful, thick toenails 1-5 b/l that he cannot cut and which interfere with daily activities.  Pain is aggravated when wearing enclosed shoe gear.  [name], MD is his PCP.   Current Outpatient Medications:  .  amLODipine (NORVASC) 10 MG tablet, TAKE 1 TABLET BY MOUTH EVERY DAY, Disp: 90 tablet, Rfl: 1 .  Calcium-Magnesium-Zinc 1000-400-15 MG TABS, Take by mouth daily.  , Disp: , Rfl:  .  cetirizine (ZYRTEC) 10 MG tablet, Take 10 mg by mouth daily., Disp: , Rfl:  .  Chelated Potassium 99 MG TABS, Take 99 mg by mouth 2 (two) times daily. , Disp: , Rfl:  .  Cinnamon 500 MG capsule, Take 500 mg 3 (three) times daily by mouth. +50 mg Chromium in each supplement., Disp: , Rfl:  .  Coenzyme Q10 (CO Q-10) 100 MG CAPS, Take 200 mg daily by mouth. , Disp: , Rfl:  .  Cyanocobalamin (VITAMIN B 12 PO), Take 1 capsule daily by mouth., Disp: , Rfl:  .  fluticasone (FLONASE) 50 MCG/ACT nasal spray, Place 2 sprays into both nostrils daily., Disp: 16 g, Rfl: 6 .  Glucosamine-Chondroitin-Vit D3 1500-1200-800 MG-MG-UNIT PACK, Take by mouth., Disp: , Rfl:  .  lisinopril (PRINIVIL,ZESTRIL) 40 MG tablet, TAKE 1 TABLET BY MOUTH EVERY DAY, Disp: 90 tablet, Rfl: 1 .  metFORMIN (GLUCOPHAGE) 500 MG tablet, TAKE 2 TABLETS (1,000 MG TOTAL) BY MOUTH 2 (TWO) TIMES DAILY WITH A MEAL., Disp: 360 tablet, Rfl: 1 .  Misc Natural Products (TART CHERRY ADVANCED PO), Take by mouth., Disp: , Rfl:  .  Omega-3 Fatty Acids (FISH OIL PO), Take 1,500 mg by mouth daily.  , Disp: , Rfl:  .  Quercetin 250 MG TABS, Take by mouth 2 (two) times daily. , Disp: , Rfl:  .  TURMERIC PO, Take by mouth., Disp: , Rfl:  .  vitamin C (ASCORBIC ACID) 500 MG tablet, Take 500 mg by mouth 3 (three) times daily. , Disp: , Rfl:  .  simvastatin (ZOCOR) 10 MG tablet, TAKE 1 TABLET BY MOUTH EVERYDAY AT BEDTIME, Disp: 90 tablet, Rfl: 1  No Known Allergies  Objective:  Vascular Examination: Capillary  refill time less than 3 seconds x 10 digits  Dorsalis pedis and Posterior tibial pulses palpable b/l  No digital hair x 10 digits  Skin temperature gradient WNL b/l  Dermatological Examination: Skin with normal turgor, texture and tone b/l  Toenails 1-5 b/l discolored, thick, dystrophic with subungual debris and pain with palpation to nailbeds due to thickness of nails.  No open wounds bilaterally  No interdigital macerations noted bilaterally  Musculoskeletal: Muscle strength 5/5 to all LE muscle groups  No gross bony deformities b/l.  No pain, crepitus or joint limitation noted with ROM.   Neurological: Sensation intact with 10 gram monofilament. Vibratory sensation intact.  Assessment: Painful onychomycosis toenails 1-5 b/l   Plan: 1. Toenails 1-5 b/l were debrided in length and girth without iatrogenic bleeding. 2. Patient to continue soft, supportive shoe gear 3. Patient to report any pedal injuries to medical professional immediately. 4. Follow up 3 months. Patient/POA to call should there be a concern in the interim.

## 2018-08-24 ENCOUNTER — Ambulatory Visit: Payer: Medicare Other | Admitting: Internal Medicine

## 2018-08-26 ENCOUNTER — Encounter: Payer: Self-pay | Admitting: Internal Medicine

## 2018-08-27 ENCOUNTER — Encounter: Payer: Self-pay | Admitting: Internal Medicine

## 2018-08-27 ENCOUNTER — Ambulatory Visit (INDEPENDENT_AMBULATORY_CARE_PROVIDER_SITE_OTHER): Payer: Medicare Other | Admitting: Internal Medicine

## 2018-08-27 VITALS — BP 152/90 | HR 70 | Ht 69.0 in | Wt 216.0 lb

## 2018-08-27 DIAGNOSIS — J310 Chronic rhinitis: Secondary | ICD-10-CM | POA: Diagnosis not present

## 2018-08-27 DIAGNOSIS — G4733 Obstructive sleep apnea (adult) (pediatric): Secondary | ICD-10-CM | POA: Diagnosis not present

## 2018-08-27 NOTE — Patient Instructions (Signed)
We can continue CPAP auto 5-15, mask of choice, humidifier, supplies, AirView/ card  Please call if we can help

## 2018-08-27 NOTE — Progress Notes (Signed)
HPI male never smoker for sleep evaluation.  Previously followed for OSA, restless legs, chronic rhinitis, complicated by HBP, DM 2, GERD, HBP, gout, NPSG 11/26/06-AHI 56/hour, desaturation to 86%, body weight 225 pounds  ----------------------------------------------------------------------------------------  08/24/17-68 year old male never smoker followed for OSA, restless legs, chronic rhinitis, complicated by HBP, DM 2, GERD, gout, chronic headaches CPAP auto 5-15/Advanced  ----OSA; DME: [facility]. Pt wears CPAP nightly and DL attached. No new supplies needed.  Download 90% compliance, AHI 0.4/hour. Using nasal mask.  No acute concerns or changes.  Does not indicate much leg movement disturbance now.  08/27/2018- 68 year old male never smoker followed for OSA, restless legs, chronic rhinitis, complicated by HBP, DM 2, GERD, gout, chronic headaches CPAP auto 5-15/Advanced Download 100% compliance AHI 0.5/hour -----Pt states he has been doing okay since last visit. States he believes he had a URI x2-3 weeks ago which did affect cpap readings due to nose being congested. Pt denies any current complaints of cough, SOB, or CP. DME: [facility] He adjust his mask a couple of times at night but is comfortable. Limb movement does not seem active problem.  ROS-see HPI   + = positive Constitutional:   No-   weight loss, night sweats, fevers, chills, fatigue, lassitude. HEENT:   No-  headaches, difficulty swallowing, tooth/dental problems, sore throat,       No-  sneezing, itching, ear ache, nasal congestion, post nasal drip,  CV:  No-   chest pain, orthopnea, PND, swelling in lower extremities, anasarca, dizziness, palpitations Resp: No-   shortness of breath with exertion or at rest.              No-   productive cough,   non-productive cough,  No- coughing up of blood.              No-   change in color of mucus.  No- wheezing.   Skin: No-   rash or lesions. GI:  No-   heartburn, indigestion, abdominal pain,  nausea, vomiting,  GU: . MS:  No-   joint pain or swelling.   Neuro-     nothing unusual Psych:  No- change in mood or affect. No depression or anxiety.  No memory loss.  OBJ General- Alert, Oriented, Affect-appropriate, Distress- none acute, + overweight Skin- rash-none, lesions- none, excoriation- none Lymphadenopathy- none Head- atraumatic            Eyes- Gross vision intact, PERRLA, conjunctivae clear secretions            Ears- Hearing, canals-normal            Nose- Clear, +narrower on right, +mucus bridging, polyps, erosion, perforation             Throat- Mallampati III , +mucosa red and cobblestoned , drainage- none, tonsils- atrophic Neck- flexible , trachea midline, no stridor , thyroid nl, carotid no bruit Chest - symmetrical excursion , unlabored           Heart/CV- RRR , no murmur , no gallop  , no rub, nl s1 s2                           - JVD- none , edema- none, stasis changes- none, varices- none           Lung- clear to P&A, wheeze- none, cough- none , dullness-none, rub- none           Chest wall-  Abd-  Br/  Gen/ Rectal- Not done, not indicated Extrem- cyanosis- none, clubbing, none, atrophy- none, strength- nl Neuro- grossly intact to observation

## 2018-09-03 ENCOUNTER — Other Ambulatory Visit (INDEPENDENT_AMBULATORY_CARE_PROVIDER_SITE_OTHER): Payer: Medicare Other

## 2018-09-03 DIAGNOSIS — E1169 Type 2 diabetes mellitus with other specified complication: Secondary | ICD-10-CM

## 2018-09-03 DIAGNOSIS — I10 Essential (primary) hypertension: Secondary | ICD-10-CM

## 2018-09-03 DIAGNOSIS — E669 Obesity, unspecified: Secondary | ICD-10-CM

## 2018-09-03 DIAGNOSIS — E782 Mixed hyperlipidemia: Secondary | ICD-10-CM

## 2018-09-03 LAB — CBC
HCT: 36.9 % — ABNORMAL LOW (ref 39.0–52.0)
Hemoglobin: 12.3 g/dL — ABNORMAL LOW (ref 13.0–17.0)
MCHC: 33.2 g/dL (ref 30.0–36.0)
MCV: 73.3 fl — ABNORMAL LOW (ref 78.0–100.0)
Platelets: 349 10*3/uL (ref 150.0–400.0)
RBC: 5.04 Mil/uL (ref 4.22–5.81)
RDW: 15.6 % — ABNORMAL HIGH (ref 11.5–15.5)
WBC: 6.6 10*3/uL (ref 4.0–10.5)

## 2018-09-03 LAB — LIPID PANEL
Cholesterol: 104 mg/dL (ref 0–200)
HDL: 27.3 mg/dL — ABNORMAL LOW (ref >39.00)
LDL Cholesterol: 49 mg/dL (ref 0–99)
NONHDL: 76.85
Total CHOL/HDL Ratio: 4
Triglycerides: 138 mg/dL (ref 0.0–149.0)
VLDL: 27.6 mg/dL (ref 0.0–40.0)

## 2018-09-03 LAB — COMPREHENSIVE METABOLIC PANEL
ALT: 27 U/L (ref 0–53)
AST: 19 U/L (ref 0–37)
Albumin: 4.1 g/dL (ref 3.5–5.2)
Alkaline Phosphatase: 60 U/L (ref 39–117)
BUN: 17 mg/dL (ref 6–23)
CO2: 27 mEq/L (ref 19–32)
Calcium: 9.1 mg/dL (ref 8.4–10.5)
Chloride: 103 mEq/L (ref 96–112)
Creatinine, Ser: 1.22 mg/dL (ref 0.40–1.50)
GFR: 59.3 mL/min — ABNORMAL LOW (ref >60.00)
Glucose, Bld: 149 mg/dL — ABNORMAL HIGH (ref 70–99)
POTASSIUM: 4.1 meq/L (ref 3.5–5.1)
Sodium: 139 mEq/L (ref 135–145)
Total Bilirubin: 0.5 mg/dL (ref 0.2–1.2)
Total Protein: 7.2 g/dL (ref 6.0–8.3)

## 2018-09-03 LAB — TSH: TSH: 1.4 u[IU]/mL (ref 0.35–4.50)

## 2018-09-03 LAB — HEMOGLOBIN A1C: HEMOGLOBIN A1C: 8.1 % — AB (ref 4.6–6.5)

## 2018-09-06 ENCOUNTER — Ambulatory Visit (INDEPENDENT_AMBULATORY_CARE_PROVIDER_SITE_OTHER): Payer: Medicare Other | Admitting: Family Medicine

## 2018-09-06 DIAGNOSIS — E782 Mixed hyperlipidemia: Secondary | ICD-10-CM | POA: Diagnosis not present

## 2018-09-06 DIAGNOSIS — D649 Anemia, unspecified: Secondary | ICD-10-CM

## 2018-09-06 DIAGNOSIS — M1A9XX0 Chronic gout, unspecified, without tophus (tophi): Secondary | ICD-10-CM

## 2018-09-06 DIAGNOSIS — I10 Essential (primary) hypertension: Secondary | ICD-10-CM

## 2018-09-06 DIAGNOSIS — E1169 Type 2 diabetes mellitus with other specified complication: Secondary | ICD-10-CM | POA: Diagnosis not present

## 2018-09-06 DIAGNOSIS — E669 Obesity, unspecified: Secondary | ICD-10-CM | POA: Diagnosis not present

## 2018-09-06 MED ORDER — METFORMIN HCL 500 MG PO TABS: 1000.0000 mg | ORAL_TABLET | Freq: Two times a day (BID) | ORAL | 1 refills | Status: DC

## 2018-09-06 NOTE — Patient Instructions (Addendum)
100-140/60-90 are normal blood pressures call if running high   Hypertension Hypertension, commonly called high blood pressure, is when the force of blood pumping through the arteries is too strong. The arteries are the blood vessels that carry blood from the heart throughout the body. Hypertension forces the heart to work harder to pump blood and may cause arteries to become narrow or stiff. Having untreated or uncontrolled hypertension can cause heart attacks, strokes, kidney disease, and other problems. A blood pressure reading consists of a higher number over a lower number. Ideally, your blood pressure should be below 120/80. The first ("top") number is called the systolic pressure. It is a measure of the pressure in your arteries as your heart beats. The second ("bottom") number is called the diastolic pressure. It is a measure of the pressure in your arteries as the heart relaxes. What are the causes? The cause of this condition is not known. What increases the risk? Some risk factors for high blood pressure are under your control. Others are not. Factors you can change  Smoking.  Having type 2 diabetes mellitus, high cholesterol, or both.  Not getting enough exercise or physical activity.  Being overweight.  Having too much fat, sugar, calories, or salt (sodium) in your diet.  Drinking too much alcohol. Factors that are difficult or impossible to change  Having chronic kidney disease.  Having a family history of high blood pressure.  Age. Risk increases with age.  Race. You may be at higher risk if you are African-American.  Gender. Men are at higher risk than women before age 36. After age 40, women are at higher risk than men.  Having obstructive sleep apnea.  Stress. What are the signs or symptoms? Extremely high blood pressure (hypertensive crisis) may cause:  Headache.  Anxiety.  Shortness of breath.  Nosebleed.  Nausea and vomiting.  Severe chest  pain.  Jerky movements you cannot control (seizures). How is this diagnosed? This condition is diagnosed by measuring your blood pressure while you are seated, with your arm resting on a surface. The cuff of the blood pressure monitor will be placed directly against the skin of your upper arm at the level of your heart. It should be measured at least twice using the same arm. Certain conditions can cause a difference in blood pressure between your right and left arms. Certain factors can cause blood pressure readings to be lower or higher than normal (elevated) for a short period of time:  When your blood pressure is higher when you are in a health care provider's office than when you are at home, this is called white coat hypertension. Most people with this condition do not need medicines.  When your blood pressure is higher at home than when you are in a health care provider's office, this is called masked hypertension. Most people with this condition may need medicines to control blood pressure. If you have a high blood pressure reading during one visit or you have normal blood pressure with other risk factors:  You may be asked to return on a different day to have your blood pressure checked again.  You may be asked to monitor your blood pressure at home for 1 week or longer. If you are diagnosed with hypertension, you may have other blood or imaging tests to help your health care provider understand your overall risk for other conditions. How is this treated? This condition is treated by making healthy lifestyle changes, such as eating healthy foods,  exercising more, and reducing your alcohol intake. Your health care provider may prescribe medicine if lifestyle changes are not enough to get your blood pressure under control, and if:  Your systolic blood pressure is above 130.  Your diastolic blood pressure is above 80. Your personal target blood pressure may vary depending on your medical  conditions, your age, and other factors. Follow these instructions at home: Eating and drinking   Eat a diet that is high in fiber and potassium, and low in sodium, added sugar, and fat. An example eating plan is called the DASH (Dietary Approaches to Stop Hypertension) diet. To eat this way: ? Eat plenty of fresh fruits and vegetables. Try to fill half of your plate at each meal with fruits and vegetables. ? Eat whole grains, such as whole wheat pasta, brown rice, or whole grain bread. Fill about one quarter of your plate with whole grains. ? Eat or drink low-fat dairy products, such as skim milk or low-fat yogurt. ? Avoid fatty cuts of meat, processed or cured meats, and poultry with skin. Fill about one quarter of your plate with lean proteins, such as fish, chicken without skin, beans, eggs, and tofu. ? Avoid premade and processed foods. These tend to be higher in sodium, added sugar, and fat.  Reduce your daily sodium intake. Most people with hypertension should eat less than 1,500 mg of sodium a day.  Limit alcohol intake to no more than 1 drink a day for nonpregnant women and 2 drinks a day for men. One drink equals 12 oz of beer, 5 oz of wine, or 1 oz of hard liquor. Lifestyle   Work with your health care provider to maintain a healthy body weight or to lose weight. Ask what an ideal weight is for you.  Get at least 30 minutes of exercise that causes your heart to beat faster (aerobic exercise) most days of the week. Activities may include walking, swimming, or biking.  Include exercise to strengthen your muscles (resistance exercise), such as pilates or lifting weights, as part of your weekly exercise routine. Try to do these types of exercises for 30 minutes at least 3 days a week.  Do not use any products that contain nicotine or tobacco, such as cigarettes and e-cigarettes. If you need help quitting, ask your health care provider.  Monitor your blood pressure at home as told by  your health care provider.  Keep all follow-up visits as told by your health care provider. This is important. Medicines  Take over-the-counter and prescription medicines only as told by your health care provider. Follow directions carefully. Blood pressure medicines must be taken as prescribed.  Do not skip doses of blood pressure medicine. Doing this puts you at risk for problems and can make the medicine less effective.  Ask your health care provider about side effects or reactions to medicines that you should watch for. Contact a health care provider if:  You think you are having a reaction to a medicine you are taking.  You have headaches that keep coming back (recurring).  You feel dizzy.  You have swelling in your ankles.  You have trouble with your vision. Get help right away if:  You develop a severe headache or confusion.  You have unusual weakness or numbness.  You feel faint.  You have severe pain in your chest or abdomen.  You vomit repeatedly.  You have trouble breathing. Summary  Hypertension is when the force of blood pumping through your  arteries is too strong. If this condition is not controlled, it may put you at risk for serious complications.  Your personal target blood pressure may vary depending on your medical conditions, your age, and other factors. For most people, a normal blood pressure is less than 120/80.  Hypertension is treated with lifestyle changes, medicines, or a combination of both. Lifestyle changes include weight loss, eating a healthy, low-sodium diet, exercising more, and limiting alcohol. This information is not intended to replace advice given to you by your health care provider. Make sure you discuss any questions you have with your health care provider. Document Released: 06/27/2005 Document Revised: 05/25/2016 Document Reviewed: 05/25/2016 Elsevier Interactive Patient Education  2019 [employer].

## 2018-09-06 NOTE — Assessment & Plan Note (Signed)
Monitor uric acid and hydrate.

## 2018-09-06 NOTE — Assessment & Plan Note (Signed)
Encouraged heart healthy diet, increase exercise, avoid trans fats, consider a krill oil cap daily 

## 2018-09-06 NOTE — Assessment & Plan Note (Addendum)
Mild, new, does endorse fatigue but also does not sleep great either. Increase leafy greens, consider increased lean red meat and using cast iron cookware. Continue to monitor, report any concerns

## 2018-09-06 NOTE — Assessment & Plan Note (Addendum)
hgba1c has increased,  minimize simple carbs. Increase exercise as tolerated. Continue current meds he is asked to increase his Metformin to 5 tabs daily but he would prefer to try adjusments to sleep and stress and stay on 4 tabs po daily for now

## 2018-09-06 NOTE — Assessment & Plan Note (Signed)
Well controlled, no changes to meds. Encouraged heart healthy diet such as the DASH diet and exercise as tolerated.  °

## 2018-09-09 NOTE — Progress Notes (Signed)
Subjective:    Patient ID: Evan Best, male    DOB: 09-04-1950, 68 y.o.   MRN: [id number]  No chief complaint on file.   HPI Patient is in today for follow up.  He was sick last week with an upper respiratory infection.  He noted cough, postnasal drip, constipation, nasal congestion and anorexia.  Noted significant fatigue as well and difficulty sleeping.  This week he feels greatly improved and those symptoms have resolved.  He notes his blood pressures at home generally run in the 130s over 70s.  Other than his recent illness he has been feeling well.  No polyuria or polydipsia.  Continues to try and maintain a heart healthy diet. Denies CP/palp/SOB/HA/fevers/GI or GU c/o. Taking meds as prescribed  Past Medical History:  Diagnosis Date  . Allergic state 09/22/2013  . Anemia 09/07/2015  . Arthritis 03/06/2015  . Diabetes mellitus type II 1/09  . Dysphagia 05/04/2017  . Esophageal reflux 03/06/2015  . Gout 09/18/2012   Left big toe  . Hypertension   . Insomnia 09/13/2015  . Migraine headache    remote history  . OSA (obstructive sleep apnea)   . Preventative health care 09/05/2014  . Right shoulder pain 02/19/2009   Qualifier: Diagnosis of  By: Wynona Luna   . RLS (restless legs syndrome) 09/07/2015  . Sleep apnea    uses cpap  . Tremor 09/07/2015    Past Surgical History:  Procedure Laterality Date  . HERNIA REPAIR  1607   umbilical hernia repair  . KNEE SURGERY     left knee surgery  . TONSILLECTOMY  1959  . VASECTOMY      Family History  Problem Relation Age of Onset  . Breast cancer Mother   . Hypertension Mother   . Hyperlipidemia Father   . Kidney disease Father   . Heart disease Father   . Hypertension Father   . Colon cancer Neg Hx   . Stomach cancer Neg Hx     Social History   Socioeconomic History  . Marital status: Married    Spouse name: Not on file  . Number of children: Not on file  . Years of education: Not on file  . Highest education  level: Not on file  Occupational History  . Not on file  Social Needs  . Financial resource strain: Not on file  . Food insecurity:    Worry: Not on file    Inability: Not on file  . Transportation needs:    Medical: Not on file    Non-medical: Not on file  Tobacco Use  . Smoking status: Never Smoker  . Smokeless tobacco: Never Used  Substance and Sexual Activity  . Alcohol use: No  . Drug use: No  . Sexual activity: Yes  Lifestyle  . Physical activity:    Days per week: Not on file    Minutes per session: Not on file  . Stress: Not on file  Relationships  . Social connections:    Talks on phone: Not on file    Gets together: Not on file    Attends religious service: Not on file    Active member of club or organization: Not on file    Attends meetings of clubs or organizations: Not on file    Relationship status: Not on file  . Intimate partner violence:    Fear of current or ex partner: Not on file    Emotionally abused: Not on file  Physically abused: Not on file    Forced sexual activity: Not on file  Other Topics Concern  . Not on file  Social History Narrative   Occupation:  Chemical engineer   Married  -  1 daughter (age 15)    Never Smoked   Alcohol use-no     Drug use-no           Outpatient Medications Prior to Visit  Medication Sig Dispense Refill  . amLODipine (NORVASC) 10 MG tablet TAKE 1 TABLET BY MOUTH EVERY DAY 90 tablet 1  . Calcium-Magnesium-Zinc 1000-400-15 MG TABS Take by mouth daily.      . cetirizine (ZYRTEC) 10 MG tablet Take 10 mg by mouth daily.    . Chelated Potassium 99 MG TABS Take 99 mg by mouth 2 (two) times daily.     . Cinnamon 500 MG capsule Take 500 mg 3 (three) times daily by mouth. +50 mg Chromium in each supplement.    . Coenzyme Q10 (CO Q-10) 100 MG CAPS Take 200 mg daily by mouth.     . Cyanocobalamin (VITAMIN B 12 PO) Take 1 capsule daily by mouth.    . Glucosamine-Chondroitin-Vit D3 1500-1200-800 MG-MG-UNIT PACK Take  by mouth.    Marland Kitchen lisinopril (PRINIVIL,ZESTRIL) 40 MG tablet TAKE 1 TABLET BY MOUTH EVERY DAY 90 tablet 1  . Misc Natural Products (TART CHERRY ADVANCED PO) Take by mouth.    . Omega-3 Fatty Acids (FISH OIL PO) Take 1,500 mg by mouth daily.      . Quercetin 250 MG TABS Take by mouth 2 (two) times daily.     . simvastatin (ZOCOR) 10 MG tablet TAKE 1 TABLET BY MOUTH EVERYDAY AT BEDTIME 90 tablet 1  . TURMERIC PO Take by mouth.    . vitamin C (ASCORBIC ACID) 500 MG tablet Take 500 mg by mouth 3 (three) times daily.     . metFORMIN (GLUCOPHAGE) 500 MG tablet TAKE 2 TABLETS (1,000 MG TOTAL) BY MOUTH 2 (TWO) TIMES DAILY WITH A MEAL. 360 tablet 1   No facility-administered medications prior to visit.     No Known Allergies  Review of Systems  Constitutional: Negative for fever and malaise/fatigue.  HENT: Negative for congestion.   Eyes: Negative for blurred vision.  Respiratory: Negative for shortness of breath.   Cardiovascular: Negative for chest pain, palpitations and leg swelling.  Gastrointestinal: Negative for abdominal pain, blood in stool and nausea.  Genitourinary: Negative for dysuria and frequency.  Musculoskeletal: Negative for falls.  Skin: Negative for rash.  Neurological: Negative for dizziness, loss of consciousness and headaches.  Endo/Heme/Allergies: Negative for environmental allergies.  Psychiatric/Behavioral: Negative for depression. The patient is not nervous/anxious.        Objective:    Physical Exam Vitals signs and nursing note reviewed.  Constitutional:      General: He is not in acute distress.    Appearance: He is well-developed.  HENT:     Head: Normocephalic and atraumatic.     Nose: Nose normal.  Eyes:     General:        Right eye: No discharge.        Left eye: No discharge.  Neck:     Musculoskeletal: Normal range of motion and neck supple.  Cardiovascular:     Rate and Rhythm: Normal rate and regular rhythm.     Heart sounds: No murmur.    Pulmonary:     Effort: Pulmonary effort is normal.     Breath  sounds: Normal breath sounds.  Abdominal:     General: Bowel sounds are normal.     Palpations: Abdomen is soft.     Tenderness: There is no abdominal tenderness.  Skin:    General: Skin is warm and dry.  Neurological:     Mental Status: He is alert and oriented to person, place, and time.     BP (!) 154/78   Pulse 83   Temp 98.2 F (36.8 C) (Oral)   Resp 18   Wt 213 lb (96.6 kg)   SpO2 98%   BMI 31.45 kg/m  Wt Readings from Last 3 Encounters:  09/06/18 213 lb (96.6 kg)  08/27/18 216 lb (98 kg)  05/24/18 215 lb 3.2 oz (97.6 kg)     Lab Results  Component Value Date   WBC 6.6 09/03/2018   HGB 12.3 (L) 09/03/2018   HCT 36.9 (L) 09/03/2018   PLT 349.0 09/03/2018   GLUCOSE 149 (H) 09/03/2018   CHOL 104 09/03/2018   TRIG 138.0 09/03/2018   HDL 27.30 (L) 09/03/2018   LDLDIRECT 74.0 08/03/2017   LDLCALC 49 09/03/2018   ALT 27 09/03/2018   AST 19 09/03/2018   NA 139 09/03/2018   K 4.1 09/03/2018   CL 103 09/03/2018   CREATININE 1.22 09/03/2018   BUN 17 09/03/2018   CO2 27 09/03/2018   TSH 1.40 09/03/2018   PSA 0.61 02/08/2018   HGBA1C 8.1 (H) 09/03/2018   MICROALBUR 1.66 03/15/2013    Lab Results  Component Value Date   TSH 1.40 09/03/2018   Lab Results  Component Value Date   WBC 6.6 09/03/2018   HGB 12.3 (L) 09/03/2018   HCT 36.9 (L) 09/03/2018   MCV 73.3 (L) 09/03/2018   PLT 349.0 09/03/2018   Lab Results  Component Value Date   NA 139 09/03/2018   K 4.1 09/03/2018   CO2 27 09/03/2018   GLUCOSE 149 (H) 09/03/2018   BUN 17 09/03/2018   CREATININE 1.22 09/03/2018   BILITOT 0.5 09/03/2018   ALKPHOS 60 09/03/2018   AST 19 09/03/2018   ALT 27 09/03/2018   PROT 7.2 09/03/2018   ALBUMIN 4.1 09/03/2018   CALCIUM 9.1 09/03/2018   GFR 59.30 (L) 09/03/2018   Lab Results  Component Value Date   CHOL 104 09/03/2018   Lab Results  Component Value Date   HDL 27.30 (L) 09/03/2018    Lab Results  Component Value Date   LDLCALC 49 09/03/2018   Lab Results  Component Value Date   TRIG 138.0 09/03/2018   Lab Results  Component Value Date   CHOLHDL 4 09/03/2018   Lab Results  Component Value Date   HGBA1C 8.1 (H) 09/03/2018       Assessment & Plan:   Problem List Items Addressed This Visit    Hyperlipidemia, mixed    Encouraged heart healthy diet, increase exercise, avoid trans fats, consider a krill oil cap daily      Relevant Orders   Lipid panel   Essential hypertension    Well controlled, no changes to meds. Encouraged heart healthy diet such as the DASH diet and exercise as tolerated.       Relevant Orders   CBC   Comprehensive metabolic panel   TSH   Diabetes mellitus type 2 in obese ([place])    hgba1c has increased,  minimize simple carbs. Increase exercise as tolerated. Continue current meds he is asked to increase his Metformin to 5 tabs daily but he would  prefer to try adjusments to sleep and stress and stay on 4 tabs po daily for now      Relevant Medications   metFORMIN (GLUCOPHAGE) 500 MG tablet   Other Relevant Orders   Hemoglobin A1c   Gout    Monitor uric acid and hydrate.      Anemia    Mild, new, does endorse fatigue but also does not sleep great either. Increase leafy greens, consider increased lean red meat and using cast iron cookware. Continue to monitor, report any concerns         I am having Evan Best maintain his Calcium-Magnesium-Zinc, Chelated Potassium, Cinnamon, Omega-3 Fatty Acids (FISH OIL PO), vitamin C, Co Q-10, cetirizine, Quercetin, Cyanocobalamin (VITAMIN B 12 PO), Glucosamine-Chondroitin-Vit D3, TURMERIC PO, Misc Natural Products (TART CHERRY ADVANCED PO), amLODipine, lisinopril, simvastatin, and metFORMIN.  Meds ordered this encounter  Medications  . metFORMIN (GLUCOPHAGE) 500 MG tablet    Sig: Take 2 tablets (1,000 mg total) by mouth 2 (two) times daily with a meal.    Dispense:  360 tablet     Refill:  1     Penni Homans, MD

## 2018-10-03 NOTE — Assessment & Plan Note (Signed)
He continues to benefit from CPAP and download confirms good compliance and control.  We discussed mask comfort issues. Plan-continue CPAP auto 5-10

## 2018-10-03 NOTE — Assessment & Plan Note (Signed)
Nasal congestion associated with a head cold it interferes some with CPAP use.  He does not routinely have a problem.

## 2018-10-08 ENCOUNTER — Ambulatory Visit: Payer: Medicare Other | Admitting: Family Medicine

## 2018-11-02 ENCOUNTER — Encounter: Payer: Self-pay | Admitting: Podiatry

## 2018-11-02 ENCOUNTER — Other Ambulatory Visit: Payer: Self-pay

## 2018-11-02 ENCOUNTER — Ambulatory Visit (INDEPENDENT_AMBULATORY_CARE_PROVIDER_SITE_OTHER): Payer: Medicare Other | Admitting: Podiatry

## 2018-11-02 VITALS — Temp 97.7°F

## 2018-11-02 DIAGNOSIS — B351 Tinea unguium: Secondary | ICD-10-CM | POA: Diagnosis not present

## 2018-11-02 DIAGNOSIS — E119 Type 2 diabetes mellitus without complications: Secondary | ICD-10-CM | POA: Diagnosis not present

## 2018-11-02 DIAGNOSIS — M79675 Pain in left toe(s): Secondary | ICD-10-CM | POA: Diagnosis not present

## 2018-11-02 DIAGNOSIS — M79674 Pain in right toe(s): Secondary | ICD-10-CM

## 2018-11-02 NOTE — Patient Instructions (Signed)
Diabetes Mellitus and Foot Care Foot care is an important part of your health, especially when you have diabetes. Diabetes may cause you to have problems because of poor blood flow (circulation) to your feet and legs, which can cause your skin to:  Become thinner and drier.  Break more easily.  Heal more slowly.  Peel and crack. You may also have nerve damage (neuropathy) in your legs and feet, causing decreased feeling in them. This means that you may not notice minor injuries to your feet that could lead to more serious problems. Noticing and addressing any potential problems early is the best way to prevent future foot problems. How to care for your feet Foot hygiene  Wash your feet daily with warm water and mild soap. Do not use hot water. Then, pat your feet and the areas between your toes until they are completely dry. Do not soak your feet as this can dry your skin.  Trim your toenails straight across. Do not dig under them or around the cuticle. File the edges of your nails with an emery board or nail file.  Apply a moisturizing lotion or petroleum jelly to the skin on your feet and to dry, brittle toenails. Use lotion that does not contain alcohol and is unscented. Do not apply lotion between your toes. Shoes and socks  Wear clean socks or stockings every day. Make sure they are not too tight. Do not wear knee-high stockings since they may decrease blood flow to your legs.  Wear shoes that fit properly and have enough cushioning. Always look in your shoes before you put them on to be sure there are no objects inside.  To break in new shoes, wear them for just a few hours a day. This prevents injuries on your feet. Wounds, scrapes, corns, and calluses  Check your feet daily for blisters, cuts, bruises, sores, and redness. If you cannot see the bottom of your feet, use a mirror or ask someone for help.  Do not cut corns or calluses or try to remove them with medicine.  If you  find a minor scrape, cut, or break in the skin on your feet, keep it and the skin around it clean and dry. You may clean these areas with mild soap and water. Do not clean the area with peroxide, alcohol, or iodine.  If you have a wound, scrape, corn, or callus on your foot, look at it several times a day to make sure it is healing and not infected. Check for: ? Redness, swelling, or pain. ? Fluid or blood. ? Warmth. ? Pus or a bad smell. General instructions  Do not cross your legs. This may decrease blood flow to your feet.  Do not use heating pads or hot water bottles on your feet. They may burn your skin. If you have lost feeling in your feet or legs, you may not know this is happening until it is too late.  Protect your feet from hot and cold by wearing shoes, such as at the beach or on hot pavement.  Schedule a complete foot exam at least once a year (annually) or more often if you have foot problems. If you have foot problems, report any cuts, sores, or bruises to your health care provider immediately. Contact a health care provider if:  You have a medical condition that increases your risk of infection and you have any cuts, sores, or bruises on your feet.  You have an injury that is not   healing.  You have redness on your legs or feet.  You feel burning or tingling in your legs or feet.  You have pain or cramps in your legs and feet.  Your legs or feet are numb.  Your feet always feel cold.  You have pain around a toenail. Get help right away if:  You have a wound, scrape, corn, or callus on your foot and: ? You have pain, swelling, or redness that gets worse. ? You have fluid or blood coming from the wound, scrape, corn, or callus. ? Your wound, scrape, corn, or callus feels warm to the touch. ? You have pus or a bad smell coming from the wound, scrape, corn, or callus. ? You have a fever. ? You have a red line going up your leg. Summary  Check your feet every day  for cuts, sores, red spots, swelling, and blisters.  Moisturize feet and legs daily.  Wear shoes that fit properly and have enough cushioning.  If you have foot problems, report any cuts, sores, or bruises to your health care provider immediately.  Schedule a complete foot exam at least once a year (annually) or more often if you have foot problems. This information is not intended to replace advice given to you by your health care provider. Make sure you discuss any questions you have with your health care provider. Document Released: 06/24/2000 Document Revised: 08/09/2017 Document Reviewed: 07/29/2016 Elsevier Interactive Patient Education  2019 [employer]  Onychomycosis/Fungal Toenails  WHAT IS IT? An infection that lies within the keratin of your nail plate that is caused by a fungus.  WHY ME? Fungal infections affect all ages, sexes, races, and creeds.  There may be many factors that predispose you to a fungal infection such as age, coexisting medical conditions such as diabetes, or an autoimmune disease; stress, medications, fatigue, genetics, etc.  Bottom line: fungus thrives in a warm, moist environment and your shoes offer such a location.  IS IT CONTAGIOUS? Theoretically, yes.  You do not want to share shoes, nail clippers or files with someone who has fungal toenails.  Walking around barefoot in the same room or sleeping in the same bed is unlikely to transfer the organism.  It is important to realize, however, that fungus can spread easily from one nail to the next on the same foot.  HOW DO WE TREAT THIS?  There are several ways to treat this condition.  Treatment may depend on many factors such as age, medications, pregnancy, liver and kidney conditions, etc.  It is best to ask your doctor which options are available to you.  1. No treatment.   Unlike many other medical concerns, you can live with this condition.  However for many people this can be a painful condition and  may lead to ingrown toenails or a bacterial infection.  It is recommended that you keep the nails cut short to help reduce the amount of fungal nail. 2. Topical treatment.  These range from herbal remedies to prescription strength nail lacquers.  About 40-50% effective, topicals require twice daily application for approximately 9 to 12 months or until an entirely new nail has grown out.  The most effective topicals are medical grade medications available through physicians offices. 3. Oral antifungal medications.  With an 80-90% cure rate, the most common oral medication requires 3 to 4 months of therapy and stays in your system for a year as the new nail grows out.  Oral antifungal medications do require   blood work to make sure it is a safe drug for you.  A liver function panel will be performed prior to starting the medication and after the first month of treatment.  It is important to have the blood work performed to avoid any harmful side effects.  In general, this medication safe but blood work is required. 4. Laser Therapy.  This treatment is performed by applying a specialized laser to the affected nail plate.  This therapy is noninvasive, fast, and non-painful.  It is not covered by insurance and is therefore, out of pocket.  The results have been very good with a 80-95% cure rate.  The [facility] is the only practice in the area to offer this therapy. 5. Permanent Nail Avulsion.  Removing the entire nail so that a new nail will not grow back. 

## 2018-11-05 ENCOUNTER — Encounter: Payer: Self-pay | Admitting: Podiatry

## 2018-11-05 NOTE — Progress Notes (Signed)
Subjective: Evan Best presents today with painful, thick toenails 1-5 b/l that he cannot cut and which interfere with daily activities.  Pain is aggravated when wearing enclosed shoe gear and relieved with periodic professional debridement.  He denies any new pedal concerns on today's visit.  [name], MD is his PCP and last visit was 09/06/2018.   Current Outpatient Medications:  .  amLODipine (NORVASC) 10 MG tablet, TAKE 1 TABLET BY MOUTH EVERY DAY, Disp: 90 tablet, Rfl: 1 .  Calcium-Magnesium-Zinc 1000-400-15 MG TABS, Take by mouth daily.  , Disp: , Rfl:  .  cetirizine (ZYRTEC) 10 MG tablet, Take 10 mg by mouth daily., Disp: , Rfl:  .  Chelated Potassium 99 MG TABS, Take 99 mg by mouth 2 (two) times daily. , Disp: , Rfl:  .  Cinnamon 500 MG capsule, Take 500 mg 3 (three) times daily by mouth. +50 mg Chromium in each supplement., Disp: , Rfl:  .  Coenzyme Q10 (CO Q-10) 100 MG CAPS, Take 200 mg daily by mouth. , Disp: , Rfl:  .  Cyanocobalamin (VITAMIN B 12 PO), Take 1 capsule daily by mouth., Disp: , Rfl:  .  Glucosamine-Chondroitin-Vit D3 1500-1200-800 MG-MG-UNIT PACK, Take by mouth., Disp: , Rfl:  .  lisinopril (PRINIVIL,ZESTRIL) 40 MG tablet, TAKE 1 TABLET BY MOUTH EVERY DAY, Disp: 90 tablet, Rfl: 1 .  metFORMIN (GLUCOPHAGE) 500 MG tablet, Take 2 tablets (1,000 mg total) by mouth 2 (two) times daily with a meal., Disp: 360 tablet, Rfl: 1 .  Misc Natural Products (TART CHERRY ADVANCED PO), Take by mouth., Disp: , Rfl:  .  Omega-3 Fatty Acids (FISH OIL PO), Take 1,500 mg by mouth daily.  , Disp: , Rfl:  .  Quercetin 250 MG TABS, Take by mouth 2 (two) times daily. , Disp: , Rfl:  .  simvastatin (ZOCOR) 10 MG tablet, TAKE 1 TABLET BY MOUTH EVERYDAY AT BEDTIME, Disp: 90 tablet, Rfl: 1 .  TURMERIC PO, Take by mouth., Disp: , Rfl:  .  vitamin C (ASCORBIC ACID) 500 MG tablet, Take 500 mg by mouth 3 (three) times daily. , Disp: , Rfl:   No Known Allergies  Objective:  Vascular  Examination: Capillary refill time <3 seconds x 10 digits.  Dorsalis pedis and Posterior tibial pulses palpable b/l.  Digital hair absent x 10 digits.  Skin temperature gradient WNL b/l.  Dermatological Examination: Skin with normal turgor, texture and tone b/l.  Toenails 1-5 b/l discolored, thick, dystrophic with subungual debris and pain with palpation to nailbeds due to thickness of nails.  No open wounds b/l.  Musculoskeletal: Muscle strength 5/5 to all LE muscle groups.  No gross bony deformities b/l.  No pain, crepitus or joint limitation noted with ROM.   Neurological: Sensation intact with 10 gram monofilament.  Vibratory sensation intact.  Last A1c= 8.1   Assessment: Painful onychomycosis toenails 1-5 b/l  NIDDM  Plan: 1. Continue diabetic foot care principles.  Literature dispensed on today. 2. Toenails 1-5 b/l were debrided in length and girth without iatrogenic bleeding. 3. Patient to continue soft, supportive shoe gear daily. 4. Patient to report any pedal injuries to medical professional immediately. 5. Follow up 3 months.  6. Patient/POA to call should there be a concern in the interim.

## 2018-11-06 ENCOUNTER — Other Ambulatory Visit: Payer: Self-pay

## 2018-11-06 ENCOUNTER — Ambulatory Visit: Payer: Medicare Other | Admitting: Family Medicine

## 2018-11-26 ENCOUNTER — Other Ambulatory Visit: Payer: Self-pay | Admitting: Family Medicine

## 2018-11-28 ENCOUNTER — Telehealth: Payer: Self-pay | Admitting: Family Medicine

## 2018-11-28 MED ORDER — FAMOTIDINE 40 MG PO TABS: 40.0000 mg | ORAL_TABLET | Freq: Every day | ORAL | 1 refills | Status: DC

## 2018-11-28 NOTE — Telephone Encounter (Signed)
Switch him to Famotidine 40 mg po daily, disp #30 with 5 rf or #90 with 1 rf per patient preference and discontinue Ranitidine in [facility]

## 2018-11-28 NOTE — Telephone Encounter (Signed)
Medications sent in. Called patient left message for patient to call the office back

## 2018-11-28 NOTE — Telephone Encounter (Signed)
Patient calling to check status of refill.  °

## 2018-11-28 NOTE — Telephone Encounter (Signed)
Copied from [place] [phone]. Topic: Quick Communication - Rx Refill/Question >> Nov 28, 2018 11:59 AM [name] wrote: Medication:  ranitidine (ZANTAC) 300 MG tablet  Patient called to inquire, since this medication is unavailable on the market, if he is supposed to be taking an alternative medication or if he is to stop all together. Please advise.

## 2018-11-28 NOTE — Telephone Encounter (Signed)
Please advise 

## 2018-12-31 ENCOUNTER — Other Ambulatory Visit: Payer: Self-pay | Admitting: Family Medicine

## 2019-01-30 ENCOUNTER — Other Ambulatory Visit: Payer: Self-pay | Admitting: Family Medicine

## 2019-02-01 ENCOUNTER — Ambulatory Visit (INDEPENDENT_AMBULATORY_CARE_PROVIDER_SITE_OTHER): Payer: Medicare Other | Admitting: Podiatry

## 2019-02-01 ENCOUNTER — Encounter: Payer: Self-pay | Admitting: Podiatry

## 2019-02-01 ENCOUNTER — Other Ambulatory Visit: Payer: Self-pay

## 2019-02-01 ENCOUNTER — Ambulatory Visit: Payer: Medicare Other | Admitting: Podiatry

## 2019-02-01 DIAGNOSIS — M79674 Pain in right toe(s): Secondary | ICD-10-CM | POA: Diagnosis not present

## 2019-02-01 DIAGNOSIS — B351 Tinea unguium: Secondary | ICD-10-CM | POA: Diagnosis not present

## 2019-02-01 DIAGNOSIS — M79675 Pain in left toe(s): Secondary | ICD-10-CM

## 2019-02-01 DIAGNOSIS — E119 Type 2 diabetes mellitus without complications: Secondary | ICD-10-CM | POA: Diagnosis not present

## 2019-02-01 NOTE — Patient Instructions (Signed)
Diabetes Mellitus and Foot Care Foot care is an important part of your health, especially when you have diabetes. Diabetes may cause you to have problems because of poor blood flow (circulation) to your feet and legs, which can cause your skin to:  Become thinner and drier.  Break more easily.  Heal more slowly.  Peel and crack. You may also have nerve damage (neuropathy) in your legs and feet, causing decreased feeling in them. This means that you may not notice minor injuries to your feet that could lead to more serious problems. Noticing and addressing any potential problems early is the best way to prevent future foot problems. How to care for your feet Foot hygiene  Wash your feet daily with warm water and mild soap. Do not use hot water. Then, pat your feet and the areas between your toes until they are completely dry. Do not soak your feet as this can dry your skin.  Trim your toenails straight across. Do not dig under them or around the cuticle. File the edges of your nails with an emery board or nail file.  Apply a moisturizing lotion or petroleum jelly to the skin on your feet and to dry, brittle toenails. Use lotion that does not contain alcohol and is unscented. Do not apply lotion between your toes. Shoes and socks  Wear clean socks or stockings every day. Make sure they are not too tight. Do not wear knee-high stockings since they may decrease blood flow to your legs.  Wear shoes that fit properly and have enough cushioning. Always look in your shoes before you put them on to be sure there are no objects inside.  To break in new shoes, wear them for just a few hours a day. This prevents injuries on your feet. Wounds, scrapes, corns, and calluses  Check your feet daily for blisters, cuts, bruises, sores, and redness. If you cannot see the bottom of your feet, use a mirror or ask someone for help.  Do not cut corns or calluses or try to remove them with medicine.  If you  find a minor scrape, cut, or break in the skin on your feet, keep it and the skin around it clean and dry. You may clean these areas with mild soap and water. Do not clean the area with peroxide, alcohol, or iodine.  If you have a wound, scrape, corn, or callus on your foot, look at it several times a day to make sure it is healing and not infected. Check for: ? Redness, swelling, or pain. ? Fluid or blood. ? Warmth. ? Pus or a bad smell. General instructions  Do not cross your legs. This may decrease blood flow to your feet.  Do not use heating pads or hot water bottles on your feet. They may burn your skin. If you have lost feeling in your feet or legs, you may not know this is happening until it is too late.  Protect your feet from hot and cold by wearing shoes, such as at the beach or on hot pavement.  Schedule a complete foot exam at least once a year (annually) or more often if you have foot problems. If you have foot problems, report any cuts, sores, or bruises to your health care provider immediately. Contact a health care provider if:  You have a medical condition that increases your risk of infection and you have any cuts, sores, or bruises on your feet.  You have an injury that is not   healing.  You have redness on your legs or feet.  You feel burning or tingling in your legs or feet.  You have pain or cramps in your legs and feet.  Your legs or feet are numb.  Your feet always feel cold.  You have pain around a toenail. Get help right away if:  You have a wound, scrape, corn, or callus on your foot and: ? You have pain, swelling, or redness that gets worse. ? You have fluid or blood coming from the wound, scrape, corn, or callus. ? Your wound, scrape, corn, or callus feels warm to the touch. ? You have pus or a bad smell coming from the wound, scrape, corn, or callus. ? You have a fever. ? You have a red line going up your leg. Summary  Check your feet every day  for cuts, sores, red spots, swelling, and blisters.  Moisturize feet and legs daily.  Wear shoes that fit properly and have enough cushioning.  If you have foot problems, report any cuts, sores, or bruises to your health care provider immediately.  Schedule a complete foot exam at least once a year (annually) or more often if you have foot problems. This information is not intended to replace advice given to you by your health care provider. Make sure you discuss any questions you have with your health care provider. Document Released: 06/24/2000 Document Revised: 08/09/2017 Document Reviewed: 07/29/2016 Elsevier Patient Education  2020 [employer]   Onychomycosis/Fungal Toenails  WHAT IS IT? An infection that lies within the keratin of your nail plate that is caused by a fungus.  WHY ME? Fungal infections affect all ages, sexes, races, and creeds.  There may be many factors that predispose you to a fungal infection such as age, coexisting medical conditions such as diabetes, or an autoimmune disease; stress, medications, fatigue, genetics, etc.  Bottom line: fungus thrives in a warm, moist environment and your shoes offer such a location.  IS IT CONTAGIOUS? Theoretically, yes.  You do not want to share shoes, nail clippers or files with someone who has fungal toenails.  Walking around barefoot in the same room or sleeping in the same bed is unlikely to transfer the organism.  It is important to realize, however, that fungus can spread easily from one nail to the next on the same foot.  HOW DO WE TREAT THIS?  There are several ways to treat this condition.  Treatment may depend on many factors such as age, medications, pregnancy, liver and kidney conditions, etc.  It is best to ask your doctor which options are available to you.  1. No treatment.   Unlike many other medical concerns, you can live with this condition.  However for many people this can be a painful condition and may lead to  ingrown toenails or a bacterial infection.  It is recommended that you keep the nails cut short to help reduce the amount of fungal nail. 2. Topical treatment.  These range from herbal remedies to prescription strength nail lacquers.  About 40-50% effective, topicals require twice daily application for approximately 9 to 12 months or until an entirely new nail has grown out.  The most effective topicals are medical grade medications available through physicians offices. 3. Oral antifungal medications.  With an 80-90% cure rate, the most common oral medication requires 3 to 4 months of therapy and stays in your system for a year as the new nail grows out.  Oral antifungal medications do require   blood work to make sure it is a safe drug for you.  A liver function panel will be performed prior to starting the medication and after the first month of treatment.  It is important to have the blood work performed to avoid any harmful side effects.  In general, this medication safe but blood work is required. 4. Laser Therapy.  This treatment is performed by applying a specialized laser to the affected nail plate.  This therapy is noninvasive, fast, and non-painful.  It is not covered by insurance and is therefore, out of pocket.  The results have been very good with a 80-95% cure rate.  [facility] is the only practice in the area to offer this therapy. 5. Permanent Nail Avulsion.  Removing the entire nail so that a new nail will not grow back. 

## 2019-02-04 NOTE — Progress Notes (Signed)
Subjective:  Evan Best presents to clinic today with cc of  painful, thick, discolored, elongated toenails 1-5 b/l that become tender and cannot cut because of thickness. Pain is aggravated when wearing enclosed shoe gear.   Current Outpatient Medications:  .  amLODipine (NORVASC) 10 MG tablet, TAKE 1 TABLET BY MOUTH EVERY DAY, Disp: 90 tablet, Rfl: 1 .  Calcium-Magnesium-Zinc 1000-400-15 MG TABS, Take by mouth daily.  , Disp: , Rfl:  .  cetirizine (ZYRTEC) 10 MG tablet, Take 10 mg by mouth daily., Disp: , Rfl:  .  Chelated Potassium 99 MG TABS, Take 99 mg by mouth 2 (two) times daily. , Disp: , Rfl:  .  Cinnamon 500 MG capsule, Take 500 mg 3 (three) times daily by mouth. +50 mg Chromium in each supplement., Disp: , Rfl:  .  Coenzyme Q10 (CO Q-10) 100 MG CAPS, Take 200 mg daily by mouth. , Disp: , Rfl:  .  Cyanocobalamin (VITAMIN B 12 PO), Take 1 capsule daily by mouth., Disp: , Rfl:  .  famotidine (PEPCID) 40 MG tablet, Take 1 tablet (40 mg total) by mouth daily., Disp: 90 tablet, Rfl: 1 .  Glucosamine-Chondroitin-Vit D3 1500-1200-800 MG-MG-UNIT PACK, Take by mouth., Disp: , Rfl:  .  lisinopril (ZESTRIL) 40 MG tablet, TAKE 1 TABLET BY MOUTH EVERY DAY, Disp: 90 tablet, Rfl: 1 .  metFORMIN (GLUCOPHAGE) 500 MG tablet, Take 2 tablets (1,000 mg total) by mouth 2 (two) times daily with a meal., Disp: 360 tablet, Rfl: 1 .  Misc Natural Products (TART CHERRY ADVANCED PO), Take by mouth., Disp: , Rfl:  .  Omega-3 Fatty Acids (FISH OIL PO), Take 1,500 mg by mouth daily.  , Disp: , Rfl:  .  ONE TOUCH ULTRA TEST test strip, USE TO CHECK BLOOD SUGAR DAILY AS NEEDED, Disp: 100 each, Rfl: 5 .  Quercetin 250 MG TABS, Take by mouth 2 (two) times daily. , Disp: , Rfl:  .  ranitidine (ZANTAC) 300 MG tablet, TAKE 1 TABLET BY MOUTH EVERYDAY AT BEDTIME, Disp: , Rfl:  .  simvastatin (ZOCOR) 10 MG tablet, TAKE 1 TABLET BY MOUTH EVERYDAY AT BEDTIME, Disp: 90 tablet, Rfl: 1 .  TURMERIC PO, Take by mouth.,  Disp: , Rfl:  .  vitamin C (ASCORBIC ACID) 500 MG tablet, Take 500 mg by mouth 3 (three) times daily. , Disp: , Rfl:    No Known Allergies   Objective: Physical Examination:  Vascular Examination: Capillary refill time <3 seconds x 10 digits.  Palpable DP/PT pulses b/l.  Digital hair absent x 10.  Skin temperature gradient WNL b/l.  Dermatological Examination: Skin with normal turgor, texture and tone b/l.  No open wounds b/l.  No interdigital macerations noted b/l.  Elongated, thick, discolored brittle toenails with subungual debris and pain on dorsal palpation of nailbeds 1-5 b/l.  Musculoskeletal Examination: Muscle strength 5/5 to all muscle groups b/l.  No pain, crepitus or joint discomfort with active/passive ROM.  Neurological Examination: Sensation intact 5/5 b/l with 10 gram monofilament.  Vibratory sensation intact b/l.  Assessment: Mycotic nail infection with pain 1-5 b/l NIDDM  Plan: 1. Toenails 1-5 b/l were debrided in length and girth without iatrogenic laceration. 2.  Continue soft, supportive shoe gear daily. 3.  Report any pedal injuries to medical professional. 4.  Follow up 3 months. 5.  Patient/POA to call should there be a question/concern in there interim.

## 2019-03-19 ENCOUNTER — Other Ambulatory Visit (INDEPENDENT_AMBULATORY_CARE_PROVIDER_SITE_OTHER): Payer: Medicare Other

## 2019-03-19 DIAGNOSIS — I10 Essential (primary) hypertension: Secondary | ICD-10-CM

## 2019-03-19 DIAGNOSIS — E1169 Type 2 diabetes mellitus with other specified complication: Secondary | ICD-10-CM | POA: Diagnosis not present

## 2019-03-19 DIAGNOSIS — E782 Mixed hyperlipidemia: Secondary | ICD-10-CM | POA: Diagnosis not present

## 2019-03-19 DIAGNOSIS — E669 Obesity, unspecified: Secondary | ICD-10-CM

## 2019-03-19 LAB — LIPID PANEL
Cholesterol: 114 mg/dL (ref 0–200)
HDL: 28.9 mg/dL — ABNORMAL LOW (ref >39.00)
LDL Cholesterol: 60 mg/dL (ref 0–99)
NonHDL: 84.64
Total CHOL/HDL Ratio: 4
Triglycerides: 123 mg/dL (ref 0.0–149.0)
VLDL: 24.6 mg/dL (ref 0.0–40.0)

## 2019-03-19 LAB — HEMOGLOBIN A1C: Hgb A1c MFr Bld: 7.4 % — ABNORMAL HIGH (ref 4.6–6.5)

## 2019-03-19 LAB — COMPREHENSIVE METABOLIC PANEL
ALT: 25 U/L (ref 0–53)
AST: 19 U/L (ref 0–37)
Albumin: 4.1 g/dL (ref 3.5–5.2)
Alkaline Phosphatase: 59 U/L (ref 39–117)
BUN: 15 mg/dL (ref 6–23)
CO2: 29 mEq/L (ref 19–32)
Calcium: 9 mg/dL (ref 8.4–10.5)
Chloride: 103 mEq/L (ref 96–112)
Creatinine, Ser: 1.14 mg/dL (ref 0.40–1.50)
GFR: 64.02 mL/min (ref >60.00)
Glucose, Bld: 132 mg/dL — ABNORMAL HIGH (ref 70–99)
Potassium: 3.6 mEq/L (ref 3.5–5.1)
Sodium: 139 mEq/L (ref 135–145)
Total Bilirubin: 0.4 mg/dL (ref 0.2–1.2)
Total Protein: 7.2 g/dL (ref 6.0–8.3)

## 2019-03-19 LAB — TSH: TSH: 2.55 u[IU]/mL (ref 0.35–4.50)

## 2019-03-19 LAB — CBC
HCT: 35.9 % — ABNORMAL LOW (ref 39.0–52.0)
Hemoglobin: 11.8 g/dL — ABNORMAL LOW (ref 13.0–17.0)
MCHC: 32.8 g/dL (ref 30.0–36.0)
MCV: 73 fl — ABNORMAL LOW (ref 78.0–100.0)
Platelets: 325 10*3/uL (ref 150.0–400.0)
RBC: 4.91 Mil/uL (ref 4.22–5.81)
RDW: 14.7 % (ref 11.5–15.5)
WBC: 8.5 10*3/uL (ref 4.0–10.5)

## 2019-03-20 DIAGNOSIS — K625 Hemorrhage of anus and rectum: Secondary | ICD-10-CM

## 2019-03-20 DIAGNOSIS — D649 Anemia, unspecified: Secondary | ICD-10-CM

## 2019-03-21 NOTE — Progress Notes (Signed)
Virtual Visit via Video Note  I connected with patient on 03/22/19 at  3:15 PM EDT by audio enabled telemedicine application and verified that I am speaking with the correct person using two identifiers.   THIS ENCOUNTER IS A VIRTUAL VISIT DUE TO COVID-19 - PATIENT WAS NOT SEEN IN THE OFFICE. PATIENT HAS CONSENTED TO VIRTUAL VISIT / TELEMEDICINE VISIT   Location of patient: home  Location of provider: office  I discussed the limitations of evaluation and management by telemedicine and the availability of in person appointments. The patient expressed understanding and agreed to proceed.   Subjective:   Evan Best is a 68 y.o. male who presents for Medicare Annual/Subsequent preventive examination.  Pt is retired Optometrist. Enjoys looking up news online.   Review of Systems:    Sleep patterns:   Takes Melatonin 10 mg 6-7 hrs. Wears CPAP. Home Safety/Smoke Alarms: Feels safe in home. Smoke alarms in place.  Lives in 3 story home with wife and daughter.  Male:   CCS- last 07/07/17. 5 yr recall    PSA-  Lab Results  Component Value Date   PSA 0.61 02/08/2018   PSA 0.53 06/17/2013   PSA 0.45 08/10/2007       Objective:    Vitals: Unable to assess. This visit is enabled though telemedicine due to Covid 19.   Advanced Directives 03/22/2019 03/15/2018 03/15/2018  Does Patient Have a Medical Advance Directive? No No Yes  Does patient want to make changes to medical advance directive? - No - Patient declined -  Would patient like information on creating a medical advance directive? No - Patient declined - -    Tobacco Social History   Tobacco Use  Smoking Status Never Smoker  Smokeless Tobacco Never Used     Counseling given: Not Answered   Clinical Intake: Pain : No/denies pain   Past Medical History:  Diagnosis Date  . Allergic state 09/22/2013  . Anemia 09/07/2015  . Arthritis 03/06/2015  . Diabetes mellitus type II 1/09  . Dysphagia 05/04/2017  .  Esophageal reflux 03/06/2015  . Gout 09/18/2012   Left big toe  . Hypertension   . Insomnia 09/13/2015  . Migraine headache    remote history  . OSA (obstructive sleep apnea)   . Preventative health care 09/05/2014  . Right shoulder pain 02/19/2009   Qualifier: Diagnosis of  By: Wynona Luna   . RLS (restless legs syndrome) 09/07/2015  . Sleep apnea    uses cpap  . Tremor 09/07/2015   Past Surgical History:  Procedure Laterality Date  . HERNIA REPAIR  [id number]   umbilical hernia repair  . KNEE SURGERY     left knee surgery  . TONSILLECTOMY  1959  . VASECTOMY     Family History  Problem Relation Age of Onset  . Breast cancer Mother   . Hypertension Mother   . Hyperlipidemia Father   . Kidney disease Father   . Heart disease Father   . Hypertension Father   . Colon cancer Neg Hx   . Stomach cancer Neg Hx    Social History   Socioeconomic History  . Marital status: Married    Spouse name: Not on file  . Number of children: Not on file  . Years of education: Not on file  . Highest education level: Not on file  Occupational History  . Not on file  Social Needs  . Financial resource strain: Not on file  . Food insecurity  Worry: Not on file    Inability: Not on file  . Transportation needs    Medical: Not on file    Non-medical: Not on file  Tobacco Use  . Smoking status: Never Smoker  . Smokeless tobacco: Never Used  Substance and Sexual Activity  . Alcohol use: No  . Drug use: No  . Sexual activity: Yes  Lifestyle  . Physical activity    Days per week: Not on file    Minutes per session: Not on file  . Stress: Not on file  Relationships  . Social Herbalist on phone: Not on file    Gets together: Not on file    Attends religious service: Not on file    Active member of club or organization: Not on file    Attends meetings of clubs or organizations: Not on file    Relationship status: Not on file  Other Topics Concern  . Not on file  Social  History Narrative   Occupation:  Chemical engineer   Married  -  1 daughter (age 30)    Never Smoked   Alcohol use-no     Drug use-no           Outpatient Encounter Medications as of 03/22/2019  Medication Sig  . amLODipine (NORVASC) 10 MG tablet TAKE 1 TABLET BY MOUTH EVERY DAY  . Calcium-Magnesium-Zinc 1000-400-15 MG TABS Take by mouth daily.    . cetirizine (ZYRTEC) 10 MG tablet Take 10 mg by mouth daily.  . Chelated Potassium 99 MG TABS Take 99 mg by mouth 2 (two) times daily.   . Cholecalciferol (D3 ADULT PO) Take by mouth.  . Cinnamon 500 MG capsule Take 500 mg 3 (three) times daily by mouth. +50 mg Chromium in each supplement.  . Coenzyme Q10 (CO Q-10) 100 MG CAPS Take 200 mg daily by mouth.   . Cyanocobalamin (VITAMIN B 12 PO) Take 1 capsule daily by mouth.  . famotidine (PEPCID) 40 MG tablet Take 1 tablet (40 mg total) by mouth daily.  . Glucosamine-Chondroitin-Vit D3 1500-1200-800 MG-MG-UNIT PACK Take by mouth.  Marland Kitchen lisinopril (ZESTRIL) 40 MG tablet TAKE 1 TABLET BY MOUTH EVERY DAY  . Melatonin 10 MG TABS Take by mouth.  . metFORMIN (GLUCOPHAGE) 500 MG tablet Take 2 tablets (1,000 mg total) by mouth 2 (two) times daily with a meal.  . Misc Natural Products (TART CHERRY ADVANCED PO) Take by mouth.  . Omega-3 Fatty Acids (FISH OIL PO) Take 1,500 mg by mouth daily.    . ONE TOUCH ULTRA TEST test strip USE TO CHECK BLOOD SUGAR DAILY AS NEEDED  . Quercetin 250 MG TABS Take by mouth 2 (two) times daily.   . simvastatin (ZOCOR) 10 MG tablet TAKE 1 TABLET BY MOUTH EVERYDAY AT BEDTIME  . TURMERIC PO Take by mouth.  . vitamin C (ASCORBIC ACID) 500 MG tablet Take 500 mg by mouth 3 (three) times daily.   . [DISCONTINUED] ranitidine (ZANTAC) 300 MG tablet TAKE 1 TABLET BY MOUTH EVERYDAY AT BEDTIME   No facility-administered encounter medications on file as of 03/22/2019.     Activities of Daily Living No flowsheet data found.  Patient Care Team: [name], MD as PCP -  General (Family Medicine) Clent Jacks, MD as Consulting Physician (Ophthalmology)   Assessment:   This is a routine wellness examination for Evan Best. Physical assessment deferred to PCP.  Exercise Activities and Dietary recommendations   Diet (meal preparation, eat out, water  intake, caffeinated beverages, dairy products, fruits and vegetables): 24 hr recall Breakfast: greek yogurt, PB crackers, coffee Lunch: blueberry scones Dinner: Chicken bites and fries Snack: popcorn   Goals    . Maintain current health (pt-stated)       Fall Risk Fall Risk  03/22/2019 03/15/2018  Falls in the past year? 0 Yes  Number falls in past yr: - 1  Injury with Fall? - No  Follow up - Education provided;Falls prevention discussed    Depression Screen PHQ 2/9 Scores 03/22/2019 03/15/2018  PHQ - 2 Score 0 0    Cognitive Function Ad8 score reviewed for issues:  Issues making decisions:no  Less interest in hobbies / activities: no  Repeats questions, stories (family complaining):no  Trouble using ordinary gadgets (microwave, computer, phone):no  Forgets the month or year: no  Mismanaging finances: no  Remembering appts:no  Daily problems with thinking and/or memory:no Ad8 score is=0        Immunization History  Administered Date(s) Administered  . Influenza Split 05/13/2011, 05/11/2012  . Influenza Whole 05/13/2009, 05/14/2010, 04/10/2013  . Influenza, High Dose Seasonal PF 05/04/2017  . Influenza-Unspecified 04/11/2014, 04/17/2015, 04/10/2018  . Pneumococcal Conjugate-13 05/04/2017  . Pneumococcal Polysaccharide-23 05/24/2018  . Tdap 06/21/2011  . Zoster Recombinat (Shingrix) 02/15/2018, 07/09/2018    Screening Tests Health Maintenance  Topic Date Due  . OPHTHALMOLOGY EXAM  10/12/2018  . INFLUENZA VACCINE  02/09/2019  . HEMOGLOBIN A1C  09/16/2019  . FOOT EXAM  11/02/2019  . TETANUS/TDAP  06/20/2021  . COLONOSCOPY  07/07/2022  . Hepatitis C Screening  Completed  . PNA  vac Low Risk Adult  Completed       Plan:   See you next year!  Continue to eat heart healthy diet (full of fruits, vegetables, whole grains, lean protein, water--limit salt, fat, and sugar intake) and increase physical activity as tolerated.  Continue doing brain stimulating activities (puzzles, reading, adult coloring books, staying active) to keep memory sharp.    I have personally reviewed and noted the following in the patient's chart:   . Medical and social history . Use of alcohol, tobacco or illicit drugs  . Current medications and supplements . Functional ability and status . Nutritional status . Physical activity . Advanced directives . List of other physicians . Hospitalizations, surgeries, and ER visits in previous 12 months . Vitals . Screenings to include cognitive, depression, and falls . Referrals and appointments  In addition, I have reviewed and discussed with patient certain preventive protocols, quality metrics, and best practice recommendations. A written personalized care plan for preventive services as well as general preventive health recommendations were provided to patient.     Shela Nevin, [place]  03/22/2019

## 2019-03-22 ENCOUNTER — Encounter: Payer: Self-pay | Admitting: *Deleted

## 2019-03-22 ENCOUNTER — Ambulatory Visit (INDEPENDENT_AMBULATORY_CARE_PROVIDER_SITE_OTHER): Payer: Medicare Other | Admitting: *Deleted

## 2019-03-22 DIAGNOSIS — Z0000 Encounter for general adult medical examination without abnormal findings: Secondary | ICD-10-CM | POA: Diagnosis not present

## 2019-03-22 NOTE — Patient Instructions (Signed)
See you next year!  Continue to eat heart healthy diet (full of fruits, vegetables, whole grains, lean protein, water--limit salt, fat, and sugar intake) and increase physical activity as tolerated.  Continue doing brain stimulating activities (puzzles, reading, adult coloring books, staying active) to keep memory sharp.    Evan Best , Thank you for taking time to come for your Medicare Wellness Visit. I appreciate your ongoing commitment to your health goals. Please review the following plan we discussed and let me know if I can assist you in the future.   These are the goals we discussed: Goals    . Maintain current health (pt-stated)       This is a list of the screening recommended for you and due dates:  Health Maintenance  Topic Date Due  . Eye exam for diabetics  10/12/2018  . Flu Shot  02/09/2019  . Hemoglobin A1C  09/16/2019  . Complete foot exam   11/02/2019  . Tetanus Vaccine  06/20/2021  . Colon Cancer Screening  07/07/2022  .  Hepatitis C: One time screening is recommended by Center for Disease Control  (CDC) for  adults born from [birth year] through 1964.   Completed  . Pneumonia vaccines  Completed

## 2019-04-01 ENCOUNTER — Other Ambulatory Visit: Payer: Self-pay | Admitting: Family Medicine

## 2019-04-02 DIAGNOSIS — L814 Other melanin hyperpigmentation: Secondary | ICD-10-CM | POA: Diagnosis not present

## 2019-04-02 DIAGNOSIS — D234 Other benign neoplasm of skin of scalp and neck: Secondary | ICD-10-CM | POA: Diagnosis not present

## 2019-04-02 DIAGNOSIS — L7211 Pilar cyst: Secondary | ICD-10-CM | POA: Diagnosis not present

## 2019-04-02 DIAGNOSIS — Z23 Encounter for immunization: Secondary | ICD-10-CM | POA: Diagnosis not present

## 2019-04-02 DIAGNOSIS — D1801 Hemangioma of skin and subcutaneous tissue: Secondary | ICD-10-CM | POA: Diagnosis not present

## 2019-04-02 DIAGNOSIS — D225 Melanocytic nevi of trunk: Secondary | ICD-10-CM | POA: Diagnosis not present

## 2019-04-02 DIAGNOSIS — L821 Other seborrheic keratosis: Secondary | ICD-10-CM | POA: Diagnosis not present

## 2019-04-14 IMAGING — DX DG ELBOW COMPLETE 3+V*L*
4 series · 4 of 4 positions shown · non-contrast
Comparison: None.

CLINICAL DATA: Pain and swelling

EXAM:
LEFT ELBOW - COMPLETE 3+ VIEW

[elbow ap]
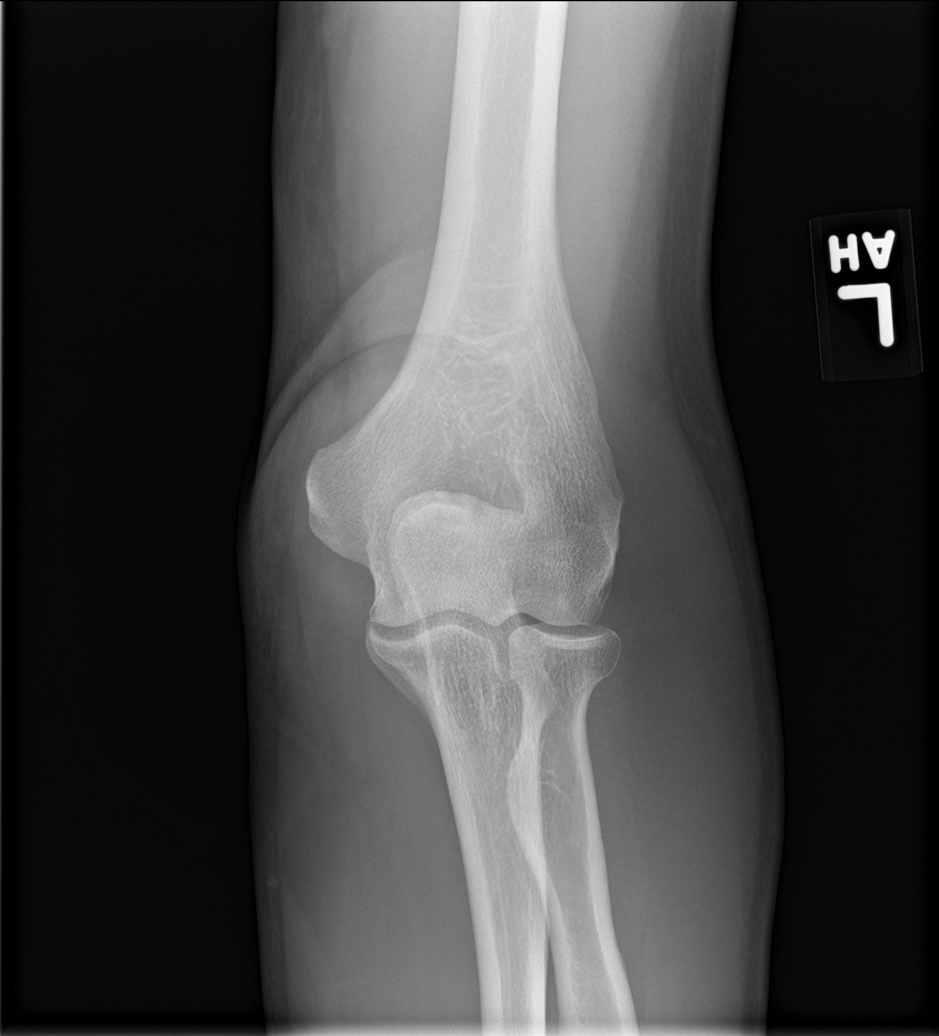

[elbow obl (1 of 2)]
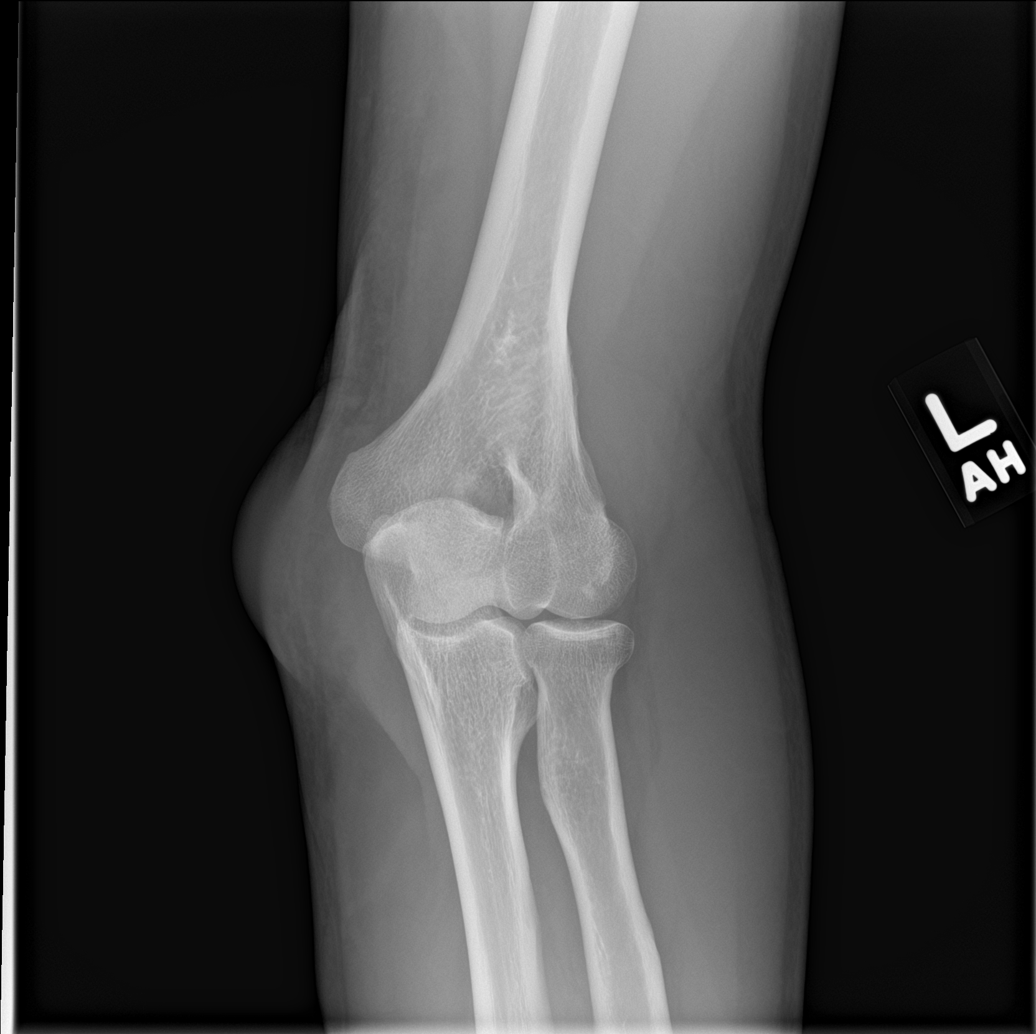

[elbow obl (2 of 2)]
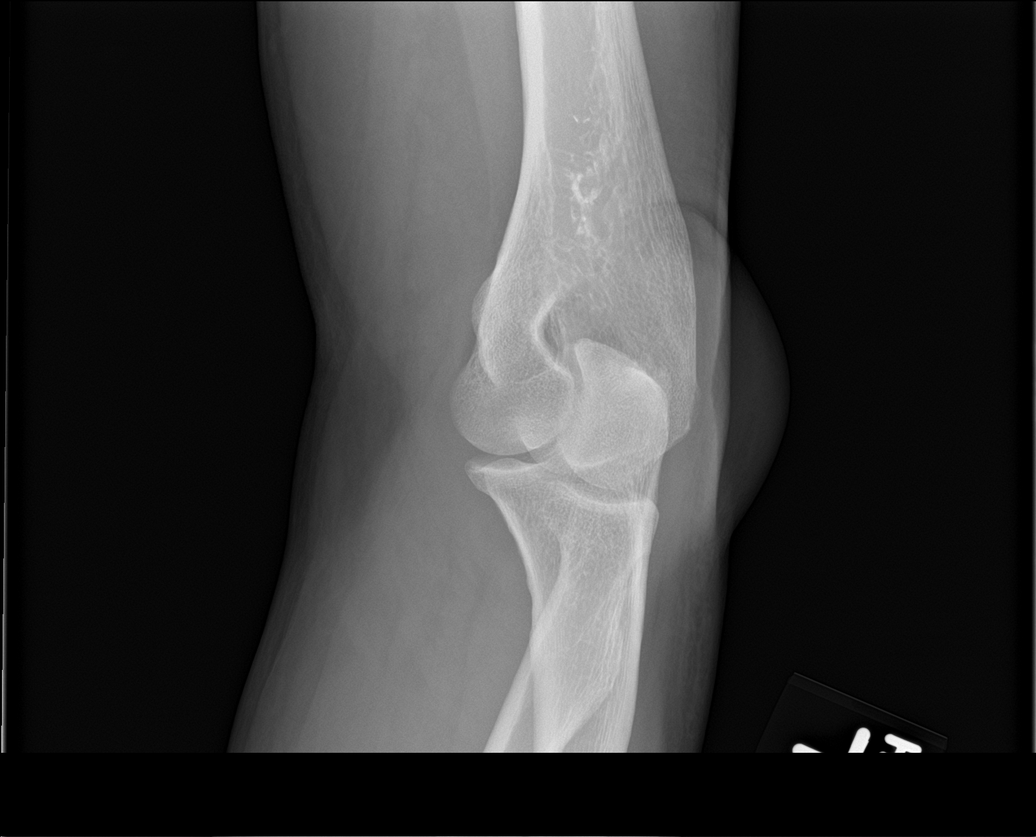

[elbow lat]
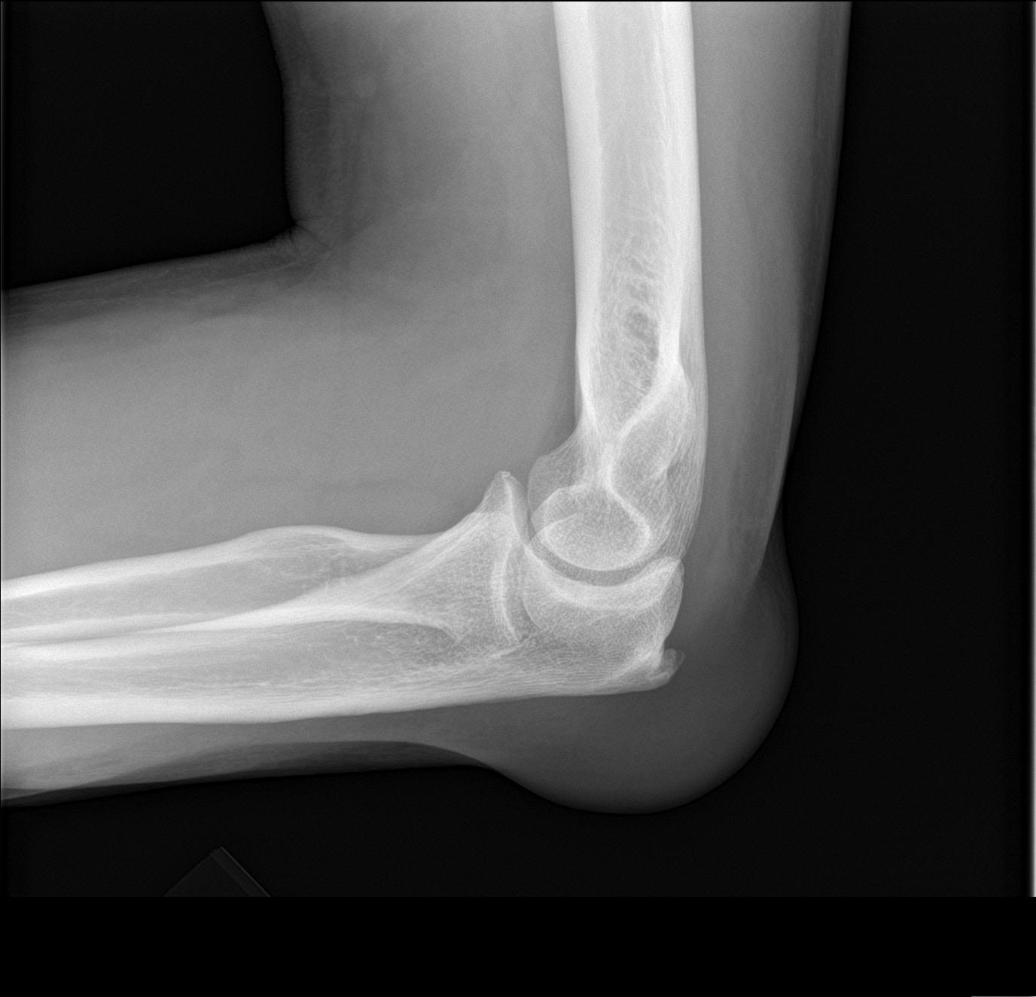

[4 of 4 positions shown; findings below may reference images not displayed]

FINDINGS: Frontal, lateral common bile oblique views were obtained. There is
marked soft tissue swelling in the region of the olecranon bursa.
There is no air or calcification in this region. There is milder
soft tissue swelling diffusely elsewhere in the elbow region. There
is no fracture or dislocation. No joint effusion. There is a spur
arising from the olecranon process of the proximal ulna. There is no
erosive change or bony destruction. No joint space narrowing.
IMPRESSION: Marked soft tissue swelling of the olecranon bursa, likely due to
olecranon bursitis. No air or calcification in this area. Milder
soft tissue swelling elsewhere in the elbow region. No fracture or
dislocation. No erosive change or bony destruction. No joint space
narrowing. There is an olecranon process proximal ulnar spur.

## 2019-04-17 ENCOUNTER — Other Ambulatory Visit (INDEPENDENT_AMBULATORY_CARE_PROVIDER_SITE_OTHER): Payer: Medicare Other

## 2019-04-17 DIAGNOSIS — K625 Hemorrhage of anus and rectum: Secondary | ICD-10-CM

## 2019-04-17 DIAGNOSIS — D649 Anemia, unspecified: Secondary | ICD-10-CM

## 2019-04-17 LAB — CBC
HCT: 36.9 % — ABNORMAL LOW (ref 39.0–52.0)
Hemoglobin: 12 g/dL — ABNORMAL LOW (ref 13.0–17.0)
MCHC: 32.7 g/dL (ref 30.0–36.0)
MCV: 71.6 fl — ABNORMAL LOW (ref 78.0–100.0)
Platelets: 323 10*3/uL (ref 150.0–400.0)
RBC: 5.15 Mil/uL (ref 4.22–5.81)
RDW: 15.1 % (ref 11.5–15.5)
WBC: 7.8 10*3/uL (ref 4.0–10.5)

## 2019-04-18 DIAGNOSIS — I10 Essential (primary) hypertension: Secondary | ICD-10-CM

## 2019-04-22 ENCOUNTER — Encounter: Payer: Self-pay | Admitting: Family Medicine

## 2019-04-22 ENCOUNTER — Ambulatory Visit (INDEPENDENT_AMBULATORY_CARE_PROVIDER_SITE_OTHER): Payer: Medicare Other | Admitting: Family Medicine

## 2019-04-22 DIAGNOSIS — I10 Essential (primary) hypertension: Secondary | ICD-10-CM | POA: Diagnosis not present

## 2019-04-22 DIAGNOSIS — D649 Anemia, unspecified: Secondary | ICD-10-CM

## 2019-04-22 DIAGNOSIS — E669 Obesity, unspecified: Secondary | ICD-10-CM

## 2019-04-22 DIAGNOSIS — G4700 Insomnia, unspecified: Secondary | ICD-10-CM | POA: Diagnosis not present

## 2019-04-22 DIAGNOSIS — M1990 Unspecified osteoarthritis, unspecified site: Secondary | ICD-10-CM

## 2019-04-22 DIAGNOSIS — E1169 Type 2 diabetes mellitus with other specified complication: Secondary | ICD-10-CM | POA: Diagnosis not present

## 2019-04-22 MED ORDER — GLUCOSE BLOOD VI STRP: ORAL_STRIP | 11 refills | Status: DC

## 2019-04-24 DIAGNOSIS — Z23 Encounter for immunization: Secondary | ICD-10-CM | POA: Diagnosis not present

## 2019-04-24 NOTE — Assessment & Plan Note (Signed)
Encouraged good sleep hygiene such as dark, quiet room. No blue/green glowing lights such as computer screens in bedroom. No alcohol or stimulants in evening. Cut down on caffeine as able. Regular exercise is helpful but not just prior to bed time. melatonin

## 2019-04-24 NOTE — Assessment & Plan Note (Signed)
He agrees to complete stool cards. Increase leafy greens, consider increased lean red meat and using cast iron cookware. Continue to monitor, report any concerns

## 2019-04-24 NOTE — Assessment & Plan Note (Signed)
Tolerating Tart Cherry extract, Turmeric and stay active.

## 2019-04-24 NOTE — Assessment & Plan Note (Signed)
Well controlled, no changes to meds. Encouraged heart healthy diet such as the DASH diet and exercise as tolerated.  °

## 2019-04-24 NOTE — Progress Notes (Signed)
Subjective:    Patient ID: Evan Best, male    DOB: 10-Apr-1951, 68 y.o.   MRN: [id number]  Chief Complaint  Patient presents with  . Diabetes    Pt needs refills on medications. Doing well, has log of blood sugars. Pt checks BP often and has logs.   . Hypertension    HPI Patient is in today for follow up on chronic medical concerns including diabetes, arthritis, anemia and more. No recent febrile illness or hospitalizations. Denies CP/palp/SOB/HA/congestion/fevers/GI or GU c/o. Taking meds as prescribed. No polyuria or polydipsia.   Past Medical History:  Diagnosis Date  . Allergic state 09/22/2013  . Anemia 09/07/2015  . Arthritis 03/06/2015  . Diabetes mellitus type II 1/09  . Dysphagia 05/04/2017  . Esophageal reflux 03/06/2015  . Gout 09/18/2012   Left big toe  . Hypertension   . Insomnia 09/13/2015  . Migraine headache    remote history  . OSA (obstructive sleep apnea)   . Preventative health care 09/05/2014  . Right shoulder pain 02/19/2009   Qualifier: Diagnosis of  By: Wynona Luna   . RLS (restless legs syndrome) 09/07/2015  . Sleep apnea    uses cpap  . Tremor 09/07/2015    Past Surgical History:  Procedure Laterality Date  . HERNIA REPAIR  [id number]   umbilical hernia repair  . KNEE SURGERY     left knee surgery  . TONSILLECTOMY  1959  . VASECTOMY      Family History  Problem Relation Age of Onset  . Breast cancer Mother   . Hypertension Mother   . Hyperlipidemia Father   . Kidney disease Father   . Heart disease Father   . Hypertension Father   . Colon cancer Neg Hx   . Stomach cancer Neg Hx     Social History   Socioeconomic History  . Marital status: Married    Spouse name: Not on file  . Number of children: Not on file  . Years of education: Not on file  . Highest education level: Not on file  Occupational History  . Not on file  Social Needs  . Financial resource strain: Not on file  . Food insecurity    Worry: Not on file   Inability: Not on file  . Transportation needs    Medical: Not on file    Non-medical: Not on file  Tobacco Use  . Smoking status: Never Smoker  . Smokeless tobacco: Never Used  Substance and Sexual Activity  . Alcohol use: No  . Drug use: No  . Sexual activity: Yes  Lifestyle  . Physical activity    Days per week: Not on file    Minutes per session: Not on file  . Stress: Not on file  Relationships  . Social Herbalist on phone: Not on file    Gets together: Not on file    Attends religious service: Not on file    Active member of club or organization: Not on file    Attends meetings of clubs or organizations: Not on file    Relationship status: Not on file  . Intimate partner violence    Fear of current or ex partner: Not on file    Emotionally abused: Not on file    Physically abused: Not on file    Forced sexual activity: Not on file  Other Topics Concern  . Not on file  Social History Narrative   Occupation:  Corporate Accountant   Married  -  1 daughter (age 66)    Never Smoked   Alcohol use-no     Drug use-no           Outpatient Medications Prior to Visit  Medication Sig Dispense Refill  . amLODipine (NORVASC) 10 MG tablet TAKE 1 TABLET BY MOUTH EVERY DAY 90 tablet 1  . Calcium-Magnesium-Zinc 1000-400-15 MG TABS Take by mouth daily.      . cetirizine (ZYRTEC) 10 MG tablet Take 10 mg by mouth daily.    . Chelated Potassium 99 MG TABS Take 99 mg by mouth 2 (two) times daily.     . Cholecalciferol (D3 ADULT PO) Take by mouth.    . Cinnamon 500 MG capsule Take 500 mg 3 (three) times daily by mouth. +50 mg Chromium in each supplement.    . Coenzyme Q10 (CO Q-10) 100 MG CAPS Take 200 mg daily by mouth.     . Cyanocobalamin (VITAMIN B 12 PO) Take 1 capsule daily by mouth.    . famotidine (PEPCID) 40 MG tablet TAKE 1 TABLET BY MOUTH EVERY DAY 90 tablet 1  . Glucosamine-Chondroitin-Vit D3 1500-1200-800 MG-MG-UNIT PACK Take by mouth.    Marland Kitchen lisinopril  (ZESTRIL) 40 MG tablet TAKE 1 TABLET BY MOUTH EVERY DAY 90 tablet 1  . Melatonin 10 MG TABS Take by mouth.    . metFORMIN (GLUCOPHAGE) 500 MG tablet Take 2 tablets (1,000 mg total) by mouth 2 (two) times daily with a meal. 360 tablet 1  . Misc Natural Products (TART CHERRY ADVANCED PO) Take by mouth.    . Omega-3 Fatty Acids (FISH OIL PO) Take 1,500 mg by mouth daily.      . ONE TOUCH ULTRA TEST test strip USE TO CHECK BLOOD SUGAR DAILY AS NEEDED 100 each 5  . Quercetin 250 MG TABS Take by mouth 2 (two) times daily.     . simvastatin (ZOCOR) 10 MG tablet TAKE 1 TABLET BY MOUTH EVERYDAY AT BEDTIME 90 tablet 1  . TURMERIC PO Take by mouth.    . vitamin C (ASCORBIC ACID) 500 MG tablet Take 500 mg by mouth 3 (three) times daily.      No facility-administered medications prior to visit.     No Known Allergies  ROS     Objective:    Physical Exam  Ht 5\' 9"  (1.753 m)   Wt 215 lb (97.5 kg)   BMI 31.75 kg/m  Wt Readings from Last 3 Encounters:  04/22/19 215 lb (97.5 kg)  09/06/18 213 lb (96.6 kg)  08/27/18 216 lb (98 kg)    Diabetic Foot Exam - Simple   No data filed     Lab Results  Component Value Date   WBC 7.8 04/17/2019   HGB 12.0 (L) 04/17/2019   HCT 36.9 (L) 04/17/2019   PLT 323.0 04/17/2019   GLUCOSE 132 (H) 03/19/2019   CHOL 114 03/19/2019   TRIG 123.0 03/19/2019   HDL 28.90 (L) 03/19/2019   LDLDIRECT 74.0 08/03/2017   LDLCALC 60 03/19/2019   ALT 25 03/19/2019   AST 19 03/19/2019   NA 139 03/19/2019   K 3.6 03/19/2019   CL 103 03/19/2019   CREATININE 1.14 03/19/2019   BUN 15 03/19/2019   CO2 29 03/19/2019   TSH 2.55 03/19/2019   PSA 0.61 02/08/2018   HGBA1C 7.4 (H) 03/19/2019   MICROALBUR 1.66 03/15/2013    Lab Results  Component Value Date   TSH 2.55 03/19/2019  Lab Results  Component Value Date   WBC 7.8 04/17/2019   HGB 12.0 (L) 04/17/2019   HCT 36.9 (L) 04/17/2019   MCV 71.6 (L) 04/17/2019   PLT 323.0 04/17/2019   Lab Results  Component  Value Date   NA 139 03/19/2019   K 3.6 03/19/2019   CO2 29 03/19/2019   GLUCOSE 132 (H) 03/19/2019   BUN 15 03/19/2019   CREATININE 1.14 03/19/2019   BILITOT 0.4 03/19/2019   ALKPHOS 59 03/19/2019   AST 19 03/19/2019   ALT 25 03/19/2019   PROT 7.2 03/19/2019   ALBUMIN 4.1 03/19/2019   CALCIUM 9.0 03/19/2019   GFR 64.02 03/19/2019   Lab Results  Component Value Date   CHOL 114 03/19/2019   Lab Results  Component Value Date   HDL 28.90 (L) 03/19/2019   Lab Results  Component Value Date   LDLCALC 60 03/19/2019   Lab Results  Component Value Date   TRIG 123.0 03/19/2019   Lab Results  Component Value Date   CHOLHDL 4 03/19/2019   Lab Results  Component Value Date   HGBA1C 7.4 (H) 03/19/2019       Assessment & Plan:   Problem List Items Addressed This Visit    Essential hypertension    Well controlled, no changes to meds. Encouraged heart healthy diet such as the DASH diet and exercise as tolerated.       Diabetes mellitus type 2 in obese (HCC)    hgba1c acceptable, minimize simple carbs. Increase exercise as tolerated. Continue current meds      Arthritis    Tolerating Tart Cherry extract, Turmeric and stay active.      Anemia    He agrees to complete stool cards. Increase leafy greens, consider increased lean red meat and using cast iron cookware. Continue to monitor, report any concerns      Insomnia    Encouraged good sleep hygiene such as dark, quiet room. No blue/green glowing lights such as computer screens in bedroom. No alcohol or stimulants in evening. Cut down on caffeine as able. Regular exercise is helpful but not just prior to bed time. melatonin         I am having Evan Best start on glucose blood. I am also having him maintain his Calcium-Magnesium-Zinc, Chelated Potassium, Cinnamon, Omega-3 Fatty Acids (FISH OIL PO), vitamin C, Co Q-10, cetirizine, Quercetin, Cyanocobalamin (VITAMIN B 12 PO), Glucosamine-Chondroitin-Vit D3,  TURMERIC PO, Misc Natural Products (TART CHERRY ADVANCED PO), metFORMIN, ONE TOUCH ULTRA TEST, lisinopril, amLODipine, simvastatin, Cholecalciferol (D3 ADULT PO), Melatonin, and famotidine.  Meds ordered this encounter  Medications  . glucose blood test strip    Sig: One touch Ultra blue -Check sugars daily-  DX E11.69    Dispense:  100 each    Refill:  11     Penni Homans, MD

## 2019-04-24 NOTE — Assessment & Plan Note (Signed)
hgba1c acceptable, minimize simple carbs. Increase exercise as tolerated. Continue current meds 

## 2019-05-10 ENCOUNTER — Encounter: Payer: Self-pay | Admitting: Podiatry

## 2019-05-10 ENCOUNTER — Other Ambulatory Visit: Payer: Self-pay

## 2019-05-10 ENCOUNTER — Ambulatory Visit (INDEPENDENT_AMBULATORY_CARE_PROVIDER_SITE_OTHER): Payer: Medicare Other | Admitting: Podiatry

## 2019-05-10 DIAGNOSIS — M79674 Pain in right toe(s): Secondary | ICD-10-CM | POA: Diagnosis not present

## 2019-05-10 DIAGNOSIS — B351 Tinea unguium: Secondary | ICD-10-CM

## 2019-05-10 DIAGNOSIS — M79675 Pain in left toe(s): Secondary | ICD-10-CM

## 2019-05-10 NOTE — Patient Instructions (Signed)
Diabetes Mellitus and Foot Care Foot care is an important part of your health, especially when you have diabetes. Diabetes may cause you to have problems because of poor blood flow (circulation) to your feet and legs, which can cause your skin to:  Become thinner and drier.  Break more easily.  Heal more slowly.  Peel and crack. You may also have nerve damage (neuropathy) in your legs and feet, causing decreased feeling in them. This means that you may not notice minor injuries to your feet that could lead to more serious problems. Noticing and addressing any potential problems early is the best way to prevent future foot problems. How to care for your feet Foot hygiene  Wash your feet daily with warm water and mild soap. Do not use hot water. Then, pat your feet and the areas between your toes until they are completely dry. Do not soak your feet as this can dry your skin.  Trim your toenails straight across. Do not dig under them or around the cuticle. File the edges of your nails with an emery board or nail file.  Apply a moisturizing lotion or petroleum jelly to the skin on your feet and to dry, brittle toenails. Use lotion that does not contain alcohol and is unscented. Do not apply lotion between your toes. Shoes and socks  Wear clean socks or stockings every day. Make sure they are not too tight. Do not wear knee-high stockings since they may decrease blood flow to your legs.  Wear shoes that fit properly and have enough cushioning. Always look in your shoes before you put them on to be sure there are no objects inside.  To break in new shoes, wear them for just a few hours a day. This prevents injuries on your feet. Wounds, scrapes, corns, and calluses  Check your feet daily for blisters, cuts, bruises, sores, and redness. If you cannot see the bottom of your feet, use a mirror or ask someone for help.  Do not cut corns or calluses or try to remove them with medicine.  If you  find a minor scrape, cut, or break in the skin on your feet, keep it and the skin around it clean and dry. You may clean these areas with mild soap and water. Do not clean the area with peroxide, alcohol, or iodine.  If you have a wound, scrape, corn, or callus on your foot, look at it several times a day to make sure it is healing and not infected. Check for: ? Redness, swelling, or pain. ? Fluid or blood. ? Warmth. ? Pus or a bad smell. General instructions  Do not cross your legs. This may decrease blood flow to your feet.  Do not use heating pads or hot water bottles on your feet. They may burn your skin. If you have lost feeling in your feet or legs, you may not know this is happening until it is too late.  Protect your feet from hot and cold by wearing shoes, such as at the beach or on hot pavement.  Schedule a complete foot exam at least once a year (annually) or more often if you have foot problems. If you have foot problems, report any cuts, sores, or bruises to your health care provider immediately. Contact a health care provider if:  You have a medical condition that increases your risk of infection and you have any cuts, sores, or bruises on your feet.  You have an injury that is not   healing.  You have redness on your legs or feet.  You feel burning or tingling in your legs or feet.  You have pain or cramps in your legs and feet.  Your legs or feet are numb.  Your feet always feel cold.  You have pain around a toenail. Get help right away if:  You have a wound, scrape, corn, or callus on your foot and: ? You have pain, swelling, or redness that gets worse. ? You have fluid or blood coming from the wound, scrape, corn, or callus. ? Your wound, scrape, corn, or callus feels warm to the touch. ? You have pus or a bad smell coming from the wound, scrape, corn, or callus. ? You have a fever. ? You have a red line going up your leg. Summary  Check your feet every day  for cuts, sores, red spots, swelling, and blisters.  Moisturize feet and legs daily.  Wear shoes that fit properly and have enough cushioning.  If you have foot problems, report any cuts, sores, or bruises to your health care provider immediately.  Schedule a complete foot exam at least once a year (annually) or more often if you have foot problems. This information is not intended to replace advice given to you by your health care provider. Make sure you discuss any questions you have with your health care provider. Document Released: 06/24/2000 Document Revised: 08/09/2017 Document Reviewed: 07/29/2016 Elsevier Patient Education  2020 Elsevier Inc.  

## 2019-05-11 NOTE — Progress Notes (Signed)
Subjective:  Evan Best presents to clinic today with cc of  painful, thick, discolored, elongated toenails 1-5 b/l that become tender and cannot cut because of thickness. Pain is aggravated when wearing enclosed shoe gear.  He voices no new pedal problems on today's visit.    Current Outpatient Medications on File Prior to Visit  Medication Sig Dispense Refill  . amLODipine (NORVASC) 10 MG tablet TAKE 1 TABLET BY MOUTH EVERY DAY 90 tablet 1  . Calcium-Magnesium-Zinc 1000-400-15 MG TABS Take by mouth daily.      . cetirizine (ZYRTEC) 10 MG tablet Take 10 mg by mouth daily.    . Chelated Potassium 99 MG TABS Take 99 mg by mouth 2 (two) times daily.     . Cholecalciferol (D3 ADULT PO) Take by mouth.    . Cinnamon 500 MG capsule Take 500 mg 3 (three) times daily by mouth. +50 mg Chromium in each supplement.    . Coenzyme Q10 (CO Q-10) 100 MG CAPS Take 200 mg daily by mouth.     . Cyanocobalamin (VITAMIN B 12 PO) Take 1 capsule daily by mouth.    . famotidine (PEPCID) 40 MG tablet TAKE 1 TABLET BY MOUTH EVERY DAY 90 tablet 1  . Glucosamine-Chondroitin-Vit D3 1500-1200-800 MG-MG-UNIT PACK Take by mouth.    Marland Kitchen glucose blood test strip One touch Ultra blue -Check sugars daily-  DX E11.69 100 each 11  . lisinopril (ZESTRIL) 40 MG tablet TAKE 1 TABLET BY MOUTH EVERY DAY 90 tablet 1  . Melatonin 10 MG TABS Take by mouth.    . metFORMIN (GLUCOPHAGE) 500 MG tablet Take 2 tablets (1,000 mg total) by mouth 2 (two) times daily with a meal. 360 tablet 1  . Misc Natural Products (TART CHERRY ADVANCED PO) Take by mouth.    . Omega-3 Fatty Acids (FISH OIL PO) Take 1,500 mg by mouth daily.      . ONE TOUCH ULTRA TEST test strip USE TO CHECK BLOOD SUGAR DAILY AS NEEDED 100 each 5  . Quercetin 250 MG TABS Take by mouth 2 (two) times daily.     . simvastatin (ZOCOR) 10 MG tablet TAKE 1 TABLET BY MOUTH EVERYDAY AT BEDTIME 90 tablet 1  . TURMERIC PO Take by mouth.    . vitamin C (ASCORBIC ACID) 500 MG tablet  Take 500 mg by mouth 3 (three) times daily.      No current facility-administered medications on file prior to visit.      No Known Allergies   Objective: There were no vitals filed for this visit.  Physical Examination:  Vascular Examination: Capillary refill time <3 seconds b/l.   Palpable DP/PT pulses b/l.  Digital hair absent b/l.  No edema noted b/l.  Skin temperature gradient WNL b/l.  Dermatological Examination: Skin with normal turgor, texture and tone b/l.  No open wounds b/l.  No interdigital macerations noted b/l.  Elongated, thick, discolored brittle toenails with subungual debris and pain on dorsal palpation of nailbeds 1-5 b/l.  Musculoskeletal Examination: Muscle strength 5/5 to all muscle groups b/l.  No pain, crepitus or joint discomfort with active/passive ROM.  Neurological Examination: Sensation intact 5/5 b/l with 10 gram monofilament.  Vibratory sensation intact b/l.  Assessment: Mycotic nail infection with pain 1-5 b/l NIDDM  Plan: 1. Toenails 1-5 b/l were debrided in length and girth without iatrogenic laceration. 2.  Continue soft, supportive shoe gear daily. 3.  Report any pedal injuries to medical professional. 4.  Follow up 3  months. 5.  Patient/POA to call should there be a question/concern in there interim.

## 2019-05-21 ENCOUNTER — Other Ambulatory Visit: Payer: Self-pay | Admitting: Family Medicine

## 2019-06-28 ENCOUNTER — Other Ambulatory Visit: Payer: Self-pay | Admitting: Family Medicine

## 2019-06-28 NOTE — Telephone Encounter (Signed)
Last OV 04/22/19 Last refill(s) 01/01/19 #90/1 Next OV 10/24/19

## 2019-07-27 ENCOUNTER — Other Ambulatory Visit: Payer: Self-pay | Admitting: Family Medicine

## 2019-08-05 ENCOUNTER — Telehealth: Payer: Self-pay | Admitting: *Deleted

## 2019-08-05 NOTE — Telephone Encounter (Signed)
Received call from [facility] stating due to multiple mail delays, they just received pt's IFOB dated October and specimen will need to be recollected if testing is still needed.  Pt would also need to return the specimen directly to our office instead of mailing it back to the lab.  Please advise?

## 2019-08-05 NOTE — Telephone Encounter (Signed)
Recollect if not already done

## 2019-08-12 ENCOUNTER — Other Ambulatory Visit: Payer: Self-pay

## 2019-08-12 ENCOUNTER — Encounter: Payer: Self-pay | Admitting: Podiatry

## 2019-08-12 ENCOUNTER — Ambulatory Visit (INDEPENDENT_AMBULATORY_CARE_PROVIDER_SITE_OTHER): Payer: Medicare Other | Admitting: Podiatry

## 2019-08-12 DIAGNOSIS — M25572 Pain in left ankle and joints of left foot: Secondary | ICD-10-CM

## 2019-08-12 DIAGNOSIS — M79674 Pain in right toe(s): Secondary | ICD-10-CM

## 2019-08-12 DIAGNOSIS — M79675 Pain in left toe(s): Secondary | ICD-10-CM | POA: Diagnosis not present

## 2019-08-12 DIAGNOSIS — B351 Tinea unguium: Secondary | ICD-10-CM

## 2019-08-12 NOTE — Progress Notes (Signed)
Subjective: Evan Best presents today for follow up of preventative diabetic foot care and painful mycotic nails b/l that are difficult to trim. Pain interferes with ambulation. Aggravating factors include wearing enclosed shoe gear. Pain is relieved with periodic professional debridement.   Patient states he is having some left ankle weakness and swelling, but feels it is due to arthritis. States ankle feels "brittle". He also has h/o gout and is currently not taking medication for it.  He denies any trauma to limb. He has h/o meniscus removal in 1980 and has knee issues on that limb as well. States issue comes and goes.   No Known Allergies   Objective: There were no vitals filed for this visit.  Vascular Examination:  Capillary fill time to digits <3s b/l, palpable DP pulses b/l, palpable PT pulses b/l, pedal hair absent b/l, skin temperature gradient within normal limits b/l, nonpitting edema noted b/l LE and no pain with calf compression b/l  Dermatological Examination: Pedal skin with normal turgor, texture and tone bilaterally, no open wounds bilaterally, no interdigital macerations bilaterally and toenails 1-5 b/l elongated, dystrophic, thickened, crumbly with subungual debris  Musculoskeletal: Normal muscle strength 5/5 to all lower extremity muscle groups bilaterally, no gross bony deformities bilaterally, no pain crepitus or joint limitation noted with ROM b/l and no effusion noted to left ankle, no warmth.   Neurological: Protective sensation intact 5/5 intact bilaterally with 10g monofilament b/l and vibratory sensation intact b/l  Assessment: 1. Pain due to onychomycosis of toenails of both feet   2. Left ankle pain, unspecified chronicity     Plan: -Continue diabetic foot care principles. Literature dispensed on today.  -Regarding left ankle, offered xray, compression anklet and ankle brace. Also discussed lab work for gout. Pt refuses work up on today, but states if  issue persists, he will call back.  -Toenails 1-5 b/l were debrided in length and girth without iatrogenic bleeding. -Patient to continue soft, supportive shoe gear daily. -Patient to report any pedal injuries to medical professional immediately. -Patient/POA to call should there be question/concern in the interim.  Return in about 3 months (around 11/09/2019) for diabetic nail trim.

## 2019-08-12 NOTE — Patient Instructions (Signed)
Diabetes Mellitus and Foot Care Foot care is an important part of your health, especially when you have diabetes. Diabetes may cause you to have problems because of poor blood flow (circulation) to your feet and legs, which can cause your skin to:  Become thinner and drier.  Break more easily.  Heal more slowly.  Peel and crack. You may also have nerve damage (neuropathy) in your legs and feet, causing decreased feeling in them. This means that you may not notice minor injuries to your feet that could lead to more serious problems. Noticing and addressing any potential problems early is the best way to prevent future foot problems. How to care for your feet Foot hygiene  Wash your feet daily with warm water and mild soap. Do not use hot water. Then, pat your feet and the areas between your toes until they are completely dry. Do not soak your feet as this can dry your skin.  Trim your toenails straight across. Do not dig under them or around the cuticle. File the edges of your nails with an emery board or nail file.  Apply a moisturizing lotion or petroleum jelly to the skin on your feet and to dry, brittle toenails. Use lotion that does not contain alcohol and is unscented. Do not apply lotion between your toes. Shoes and socks  Wear clean socks or stockings every day. Make sure they are not too tight. Do not wear knee-high stockings since they may decrease blood flow to your legs.  Wear shoes that fit properly and have enough cushioning. Always look in your shoes before you put them on to be sure there are no objects inside.  To break in new shoes, wear them for just a few hours a day. This prevents injuries on your feet. Wounds, scrapes, corns, and calluses  Check your feet daily for blisters, cuts, bruises, sores, and redness. If you cannot see the bottom of your feet, use a mirror or ask someone for help.  Do not cut corns or calluses or try to remove them with medicine.  If you  find a minor scrape, cut, or break in the skin on your feet, keep it and the skin around it clean and dry. You may clean these areas with mild soap and water. Do not clean the area with peroxide, alcohol, or iodine.  If you have a wound, scrape, corn, or callus on your foot, look at it several times a day to make sure it is healing and not infected. Check for: ? Redness, swelling, or pain. ? Fluid or blood. ? Warmth. ? Pus or a bad smell. General instructions  Do not cross your legs. This may decrease blood flow to your feet.  Do not use heating pads or hot water bottles on your feet. They may burn your skin. If you have lost feeling in your feet or legs, you may not know this is happening until it is too late.  Protect your feet from hot and cold by wearing shoes, such as at the beach or on hot pavement.  Schedule a complete foot exam at least once a year (annually) or more often if you have foot problems. If you have foot problems, report any cuts, sores, or bruises to your health care provider immediately. Contact a health care provider if:  You have a medical condition that increases your risk of infection and you have any cuts, sores, or bruises on your feet.  You have an injury that is not   healing.  You have redness on your legs or feet.  You feel burning or tingling in your legs or feet.  You have pain or cramps in your legs and feet.  Your legs or feet are numb.  Your feet always feel cold.  You have pain around a toenail. Get help right away if:  You have a wound, scrape, corn, or callus on your foot and: ? You have pain, swelling, or redness that gets worse. ? You have fluid or blood coming from the wound, scrape, corn, or callus. ? Your wound, scrape, corn, or callus feels warm to the touch. ? You have pus or a bad smell coming from the wound, scrape, corn, or callus. ? You have a fever. ? You have a red line going up your leg. Summary  Check your feet every day  for cuts, sores, red spots, swelling, and blisters.  Moisturize feet and legs daily.  Wear shoes that fit properly and have enough cushioning.  If you have foot problems, report any cuts, sores, or bruises to your health care provider immediately.  Schedule a complete foot exam at least once a year (annually) or more often if you have foot problems. This information is not intended to replace advice given to you by your health care provider. Make sure you discuss any questions you have with your health care provider. Document Revised: 03/20/2019 Document Reviewed: 07/29/2016 Elsevier Patient Education  2020 [employer] Inc.  

## 2019-08-29 ENCOUNTER — Ambulatory Visit: Payer: Medicare Other | Admitting: Internal Medicine

## 2019-09-19 NOTE — Telephone Encounter (Signed)
Spoke with pt. He states he has another IFOB kit at home and he will completed that and bring it back to our office once it has been collected. He is aware not to mail it back to the lab.

## 2019-10-03 ENCOUNTER — Encounter: Payer: Self-pay | Admitting: Internal Medicine

## 2019-10-03 ENCOUNTER — Other Ambulatory Visit: Payer: Self-pay

## 2019-10-03 ENCOUNTER — Ambulatory Visit (INDEPENDENT_AMBULATORY_CARE_PROVIDER_SITE_OTHER): Payer: Medicare Other | Admitting: Internal Medicine

## 2019-10-03 DIAGNOSIS — G4733 Obstructive sleep apnea (adult) (pediatric): Secondary | ICD-10-CM

## 2019-10-03 DIAGNOSIS — K219 Gastro-esophageal reflux disease without esophagitis: Secondary | ICD-10-CM | POA: Diagnosis not present

## 2019-10-03 NOTE — Patient Instructions (Signed)
We can continue CPAP auto 5-15, mask of choice, humidifier, supplies, AirView/ card  Please call if we can help

## 2019-10-03 NOTE — Progress Notes (Signed)
HPI male never smoker for sleep evaluation.  Previously followed for OSA, restless legs, chronic rhinitis, complicated by HBP, DM 2, GERD, HBP, gout, NPSG 11/26/06-AHI 56/hour, desaturation to 86%, body weight 225 pounds  ----------------------------------------------------------------------------------------   08/27/2018- 69 year old male never smoker followed for OSA, restless legs, chronic rhinitis, complicated by HBP, DM 2, GERD, gout, chronic headaches CPAP auto 5-15/Advanced Download 100% compliance AHI 0.5/hour -----Pt states he has been doing okay since last visit. States he believes he had a URI x2-3 weeks ago which did affect cpap readings due to nose being congested. Pt denies any current complaints of cough, SOB, or CP. DME: [facility] He adjust his mask a couple of times at night but is comfortable. Limb movement does not seem active problem.  10/03/19- 69 year old male never smoker followed for OSA, restless legs, chronic rhinitis, complicated by HBP, DM 2, GERD, gout, chronic headaches CPAP auto 5-15/Adapt Download compliance 97%, AHI 0.5/ hr Body weight today 213 lbs No Covax yet He is apparently quite comfortable to maintain social distancing and other covid precautions. Doing well with current CPAP settings.  Denies other medical issues of concern.   ROS-see HPI   + = positive Constitutional:   No-   weight loss, night sweats, fevers, chills, fatigue, lassitude. HEENT:   No-  headaches, difficulty swallowing, tooth/dental problems, sore throat,       No-  sneezing, itching, ear ache, nasal congestion, post nasal drip,  CV:  No-   chest pain, orthopnea, PND, swelling in lower extremities, anasarca, dizziness, palpitations Resp: No-   shortness of breath with exertion or at rest.              No-   productive cough,   non-productive cough,  No- coughing up of blood.              No-   change in color of mucus.  No- wheezing.   Skin: No-   rash or lesions. GI:  No-   heartburn,  indigestion, abdominal pain, nausea, vomiting,  GU: . MS:  No-   joint pain or swelling.   Neuro-     nothing unusual Psych:  No- change in mood or affect. No depression or anxiety.  No memory loss.  OBJ General- Alert, Oriented, Affect-appropriate, Distress- none acute, + overweight Skin- rash-none, lesions- none, excoriation- none Lymphadenopathy- none Head- atraumatic            Eyes- Gross vision intact, PERRLA, conjunctivae clear secretions            Ears- Hearing, canals-normal            Nose- Clear, +narrower on right, +mucus bridging, polyps, erosion, perforation             Throat- Mallampati III , mucosa  , drainage- none, tonsils- atrophic Neck- flexible , trachea midline, no stridor , thyroid nl, carotid no bruit Chest - symmetrical excursion , unlabored           Heart/CV- RRR , no murmur , no gallop  , no rub, nl s1 s2                           - JVD- none , edema- none, stasis changes- none, varices- none           Lung- clear to P&A, wheeze- none, cough- none , dullness-none, rub- none           Chest wall-  Abd-  Br/ Gen/ Rectal- Not done, not indicated Extrem- cyanosis- none, clubbing, none, atrophy- none, strength- nl Neuro- grossly intact to observation

## 2019-10-04 NOTE — Assessment & Plan Note (Signed)
Continued emphasis on reflux precautions.

## 2019-10-04 NOTE — Assessment & Plan Note (Signed)
Benefits from CPAP as reviewed Plan- continue auto 5-15, mask of choice, humidifier, supplies, AirView/ card

## 2019-10-21 DIAGNOSIS — H2513 Age-related nuclear cataract, bilateral: Secondary | ICD-10-CM | POA: Diagnosis not present

## 2019-10-21 DIAGNOSIS — H353131 Nonexudative age-related macular degeneration, bilateral, early dry stage: Secondary | ICD-10-CM | POA: Diagnosis not present

## 2019-10-21 DIAGNOSIS — E119 Type 2 diabetes mellitus without complications: Secondary | ICD-10-CM | POA: Diagnosis not present

## 2019-10-21 DIAGNOSIS — H04123 Dry eye syndrome of bilateral lacrimal glands: Secondary | ICD-10-CM | POA: Diagnosis not present

## 2019-10-21 LAB — HM DIABETES EYE EXAM

## 2019-10-22 ENCOUNTER — Encounter: Payer: Self-pay | Admitting: *Deleted

## 2019-10-22 ENCOUNTER — Other Ambulatory Visit (INDEPENDENT_AMBULATORY_CARE_PROVIDER_SITE_OTHER): Payer: Medicare Other

## 2019-10-22 DIAGNOSIS — I10 Essential (primary) hypertension: Secondary | ICD-10-CM

## 2019-10-22 LAB — CBC
HCT: 37.7 % — ABNORMAL LOW (ref 39.0–52.0)
Hemoglobin: 12.4 g/dL — ABNORMAL LOW (ref 13.0–17.0)
MCHC: 32.8 g/dL (ref 30.0–36.0)
MCV: 74 fl — ABNORMAL LOW (ref 78.0–100.0)
Platelets: 334 10*3/uL (ref 150.0–400.0)
RBC: 5.09 Mil/uL (ref 4.22–5.81)
RDW: 15.1 % (ref 11.5–15.5)
WBC: 8.9 10*3/uL (ref 4.0–10.5)

## 2019-10-23 ENCOUNTER — Other Ambulatory Visit: Payer: Self-pay

## 2019-10-24 ENCOUNTER — Other Ambulatory Visit: Payer: Self-pay

## 2019-10-24 ENCOUNTER — Ambulatory Visit (INDEPENDENT_AMBULATORY_CARE_PROVIDER_SITE_OTHER): Payer: Medicare Other | Admitting: Family Medicine

## 2019-10-24 VITALS — BP 152/70 | HR 86 | Temp 98.8°F | Resp 12 | Ht 71.0 in | Wt 213.0 lb

## 2019-10-24 DIAGNOSIS — E1169 Type 2 diabetes mellitus with other specified complication: Secondary | ICD-10-CM

## 2019-10-24 DIAGNOSIS — E782 Mixed hyperlipidemia: Secondary | ICD-10-CM | POA: Diagnosis not present

## 2019-10-24 DIAGNOSIS — D649 Anemia, unspecified: Secondary | ICD-10-CM

## 2019-10-24 DIAGNOSIS — G4733 Obstructive sleep apnea (adult) (pediatric): Secondary | ICD-10-CM | POA: Diagnosis not present

## 2019-10-24 DIAGNOSIS — E669 Obesity, unspecified: Secondary | ICD-10-CM

## 2019-10-24 DIAGNOSIS — M1A9XX0 Chronic gout, unspecified, without tophus (tophi): Secondary | ICD-10-CM | POA: Diagnosis not present

## 2019-10-24 DIAGNOSIS — R350 Frequency of micturition: Secondary | ICD-10-CM | POA: Diagnosis not present

## 2019-10-24 DIAGNOSIS — M791 Myalgia, unspecified site: Secondary | ICD-10-CM | POA: Diagnosis not present

## 2019-10-24 DIAGNOSIS — M25511 Pain in right shoulder: Secondary | ICD-10-CM | POA: Diagnosis not present

## 2019-10-24 DIAGNOSIS — I10 Essential (primary) hypertension: Secondary | ICD-10-CM

## 2019-10-24 DIAGNOSIS — M1990 Unspecified osteoarthritis, unspecified site: Secondary | ICD-10-CM

## 2019-10-24 NOTE — Assessment & Plan Note (Signed)
Tolerating statin, encouraged heart healthy diet, avoid trans fats, minimize simple carbs and saturated fats. Increase exercise as tolerated 

## 2019-10-24 NOTE — Assessment & Plan Note (Signed)
Worse after a long night of sleep without moving. It is some better today. He declines referral at this time.

## 2019-10-24 NOTE — Progress Notes (Signed)
Subjective:    Patient ID: Evan Best, male    DOB: [birth year], 69 y.o.   MRN: [id number]  Chief Complaint  Patient presents with  . [facility]    HPI Patient is in today for follow up on chronic medical concerns. No recent febrile illness or hospitalizations. Sugars are up some 150s to 190s fasting. Notes some polyuria but no dysuria or hematuria. Is trying to eat well, continues to eat well most days although acknowledges sodium intake. He notes increased trouble with right shoulder pain worse after a long nights sleep without changing positions. Denies CP/palp/SOB/HA/congestion/fevers/GI c/o. Taking meds as prescribed  Past Medical History:  Diagnosis Date  . Allergic state 09/22/2013  . Anemia 09/07/2015  . Arthritis 03/06/2015  . Diabetes mellitus type II 1/09  . Dysphagia 05/04/2017  . Esophageal reflux 03/06/2015  . Gout 09/18/2012   Left big toe  . Hypertension   . Insomnia 09/13/2015  . Migraine headache    remote history  . OSA (obstructive sleep apnea)   . Preventative health care 09/05/2014  . Right shoulder pain 02/19/2009   Qualifier: Diagnosis of  By: Wynona Luna   . RLS (restless legs syndrome) 09/07/2015  . Sleep apnea    uses cpap  . Tremor 09/07/2015    Past Surgical History:  Procedure Laterality Date  . HERNIA REPAIR  [id number]   umbilical hernia repair  . KNEE SURGERY     left knee surgery  . TONSILLECTOMY  1959  . VASECTOMY      Family History  Problem Relation Age of Onset  . Breast cancer Mother   . Hypertension Mother   . Hyperlipidemia Father   . Kidney disease Father   . Heart disease Father   . Hypertension Father   . Colon cancer Neg Hx   . Stomach cancer Neg Hx     Social History   Socioeconomic History  . Marital status: Married    Spouse name: Not on file  . Number of children: Not on file  . Years of education: Not on file  . Highest education level: Not on file  Occupational History  . Not on file  Tobacco Use   . Smoking status: Never Smoker  . Smokeless tobacco: Never Used  Substance and Sexual Activity  . Alcohol use: No  . Drug use: No  . Sexual activity: Yes  Other Topics Concern  . Not on file  Social History Narrative   Occupation:  Chemical engineer   Married  -  1 daughter (age 45)    Never Smoked   Alcohol use-no     Drug use-no          Social Determinants of Health   Financial Resource Strain:   . Difficulty of Paying Living Expenses:   Food Insecurity:   . Worried About Charity fundraiser in the Last Year:   . Arboriculturist in the Last Year:   Transportation Needs:   . Film/video editor (Medical):   Marland Kitchen Lack of Transportation (Non-Medical):   Physical Activity:   . Days of Exercise per Week:   . Minutes of Exercise per Session:   Stress:   . Feeling of Stress :   Social Connections:   . Frequency of Communication with Friends and Family:   . Frequency of Social Gatherings with Friends and Family:   . Attends Religious Services:   . Active Member of Clubs or Organizations:   .  Attends Archivist Meetings:   Marland Kitchen Marital Status:   Intimate Partner Violence:   . Fear of Current or Ex-Partner:   . Emotionally Abused:   Marland Kitchen Physically Abused:   . Sexually Abused:     Outpatient Medications Prior to Visit  Medication Sig Dispense Refill  . amLODipine (NORVASC) 10 MG tablet TAKE 1 TABLET BY MOUTH EVERY DAY 90 tablet 1  . Calcium-Magnesium-Zinc 1000-400-15 MG TABS Take by mouth daily.      . cetirizine (ZYRTEC) 10 MG tablet Take 10 mg by mouth daily.    . Chelated Potassium 99 MG TABS Take 99 mg by mouth 2 (two) times daily.     . Cholecalciferol (D3 ADULT PO) Take by mouth.    . Cinnamon 500 MG capsule Take 500 mg 3 (three) times daily by mouth. +50 mg Chromium in each supplement.    . Coenzyme Q10 (CO Q-10) 100 MG CAPS Take 200 mg daily by mouth.     . Cyanocobalamin (VITAMIN B 12 PO) Take 1 capsule daily by mouth.    . famotidine (PEPCID) 40 MG  tablet TAKE 1 TABLET BY MOUTH EVERY DAY 90 tablet 1  . Glucosamine-Chondroitin-Vit D3 1500-1200-800 MG-MG-UNIT PACK Take by mouth.    Marland Kitchen glucose blood test strip One touch Ultra blue -Check sugars daily-  DX E11.69 100 each 11  . lisinopril (ZESTRIL) 40 MG tablet TAKE 1 TABLET BY MOUTH EVERY DAY 90 tablet 1  . Melatonin 10 MG TABS Take by mouth.    . metFORMIN (GLUCOPHAGE) 500 MG tablet TAKE 2 TABLETS (1,000 MG TOTAL) BY MOUTH 2 (TWO) TIMES DAILY WITH A MEAL. 360 tablet 1  . Misc Natural Products (TART CHERRY ADVANCED PO) Take by mouth.    . Omega-3 Fatty Acids (FISH OIL PO) Take 1,500 mg by mouth daily.      . ONE TOUCH ULTRA TEST test strip USE TO CHECK BLOOD SUGAR DAILY AS NEEDED 100 each 5  . Quercetin 250 MG TABS Take by mouth 2 (two) times daily.     . simvastatin (ZOCOR) 10 MG tablet TAKE 1 TABLET BY MOUTH EVERYDAY AT BEDTIME 90 tablet 1  . TURMERIC PO Take by mouth.    . vitamin C (ASCORBIC ACID) 500 MG tablet Take 500 mg by mouth 3 (three) times daily.      No facility-administered medications prior to visit.    No Known Allergies  Review of Systems  Constitutional: Negative for fever and malaise/fatigue.  HENT: Negative for congestion.   Eyes: Negative for blurred vision.  Respiratory: Negative for shortness of breath.   Cardiovascular: Negative for chest pain, palpitations and leg swelling.  Gastrointestinal: Negative for abdominal pain, blood in stool and nausea.  Genitourinary: Negative for dysuria and frequency.  Musculoskeletal: Negative for falls.  Skin: Negative for rash.  Neurological: Negative for dizziness, loss of consciousness and headaches.  Endo/Heme/Allergies: Negative for environmental allergies.  Psychiatric/Behavioral: Negative for depression. The patient is not nervous/anxious.        Objective:    Physical Exam Vitals and nursing note reviewed.  Constitutional:      General: He is not in acute distress.    Appearance: He is well-developed.  HENT:      Head: Normocephalic and atraumatic.     Nose: Nose normal.  Eyes:     General:        Right eye: No discharge.        Left eye: No discharge.  Cardiovascular:  Rate and Rhythm: Normal rate and regular rhythm.     Heart sounds: No murmur.  Pulmonary:     Effort: Pulmonary effort is normal.     Breath sounds: Normal breath sounds.  Abdominal:     General: Bowel sounds are normal.     Palpations: Abdomen is soft.     Tenderness: There is no abdominal tenderness.  Musculoskeletal:     Cervical back: Normal range of motion and neck supple.  Skin:    General: Skin is warm and dry.  Neurological:     Mental Status: He is alert and oriented to person, place, and time.     BP (!) 152/70 (BP Location: Left Arm, Cuff Size: Normal)   Pulse 86   Temp 98.8 F (37.1 C) (Temporal)   Resp 12   Ht 5\' 11"  (1.803 m)   Wt 213 lb (96.6 kg)   SpO2 98%   BMI 29.71 kg/m  Wt Readings from Last 3 Encounters:  10/24/19 213 lb (96.6 kg)  10/03/19 213 lb (96.6 kg)  04/22/19 215 lb (97.5 kg)    Diabetic Foot Exam - Simple   No data filed     Lab Results  Component Value Date   WBC 8.9 10/22/2019   HGB 12.4 (L) 10/22/2019   HCT 37.7 (L) 10/22/2019   PLT 334.0 10/22/2019   GLUCOSE 132 (H) 03/19/2019   CHOL 114 03/19/2019   TRIG 123.0 03/19/2019   HDL 28.90 (L) 03/19/2019   LDLDIRECT 74.0 08/03/2017   LDLCALC 60 03/19/2019   ALT 25 03/19/2019   AST 19 03/19/2019   NA 139 03/19/2019   K 3.6 03/19/2019   CL 103 03/19/2019   CREATININE 1.14 03/19/2019   BUN 15 03/19/2019   CO2 29 03/19/2019   TSH 2.55 03/19/2019   PSA 0.61 02/08/2018   HGBA1C 7.4 (H) 03/19/2019   MICROALBUR 1.66 03/15/2013    Lab Results  Component Value Date   TSH 2.55 03/19/2019   Lab Results  Component Value Date   WBC 8.9 10/22/2019   HGB 12.4 (L) 10/22/2019   HCT 37.7 (L) 10/22/2019   MCV 74.0 (L) 10/22/2019   PLT 334.0 10/22/2019   Lab Results  Component Value Date   NA 139 03/19/2019    K 3.6 03/19/2019   CO2 29 03/19/2019   GLUCOSE 132 (H) 03/19/2019   BUN 15 03/19/2019   CREATININE 1.14 03/19/2019   BILITOT 0.4 03/19/2019   ALKPHOS 59 03/19/2019   AST 19 03/19/2019   ALT 25 03/19/2019   PROT 7.2 03/19/2019   ALBUMIN 4.1 03/19/2019   CALCIUM 9.0 03/19/2019   GFR 64.02 03/19/2019   Lab Results  Component Value Date   CHOL 114 03/19/2019   Lab Results  Component Value Date   HDL 28.90 (L) 03/19/2019   Lab Results  Component Value Date   LDLCALC 60 03/19/2019   Lab Results  Component Value Date   TRIG 123.0 03/19/2019   Lab Results  Component Value Date   CHOLHDL 4 03/19/2019   Lab Results  Component Value Date   HGBA1C 7.4 (H) 03/19/2019       Assessment & Plan:   Problem List Items Addressed This Visit    Hyperlipidemia, mixed    Tolerating statin, encouraged heart healthy diet, avoid trans fats, minimize simple carbs and saturated fats. Increase exercise as tolerated      Obstructive sleep apnea    Is using CPAP regularly. Feels much better.  Essential hypertension - Primary   Right shoulder pain    Worse after a long night of sleep without moving. It is some better today. He declines referral at this time.       Diabetes mellitus type 2 in obese (HCC)   Myalgia   Gout    Hydrate and monitor      Arthritis    Knees, thumbs, back all bother him most days but he feels he is managing it well most days.       Other Visit Diagnoses    Urinary frequency   (Chronic)        I am having Evan Best maintain his Calcium-Magnesium-Zinc, Chelated Potassium, Cinnamon, Omega-3 Fatty Acids (FISH OIL PO), vitamin C, Co Q-10, cetirizine, Quercetin, Cyanocobalamin (VITAMIN B 12 PO), Glucosamine-Chondroitin-Vit D3, TURMERIC PO, Misc Natural Products (TART CHERRY ADVANCED PO), ONE TOUCH ULTRA TEST, Cholecalciferol (D3 ADULT PO), Melatonin, famotidine, glucose blood, metFORMIN, lisinopril, amLODipine, and simvastatin.  No orders of  the defined types were placed in this encounter.    Penni Homans, MD

## 2019-10-24 NOTE — Assessment & Plan Note (Signed)
Knees, thumbs, back all bother him most days but he feels he is managing it well most days.

## 2019-10-24 NOTE — Assessment & Plan Note (Signed)
Hydrate and monitor 

## 2019-10-24 NOTE — Assessment & Plan Note (Signed)
Is using CPAP regularly. Feels much better.

## 2019-10-24 NOTE — Patient Instructions (Addendum)
Omron Blood Pressure cuff, upper arm, want BP 100-140/60-90 Pulse oximeter, want oxygen in 90s  Weekly vitals  Take Multivitamin with minerals, selenium Vitamin D 1000-2000 IU daily Probiotic with lactobacillus and bifidophilus Asprin EC 81 mg daily  Melatonin 2-5 mg at bedtime  [email or web address]  The mRNA technology has been in development for 20 years and we already had the Coronavirus family of viruses (which usually just cause the common cold) genetically mapped already which is why we were able to come up with viable vaccine candidates so quickly in stage 1, then stage 2 scientifically took the correct amount of time what we did to speed it up was just build the manufacturing platform at the same time we were running the experiments so if it worked we could produce faster. And stage 3 has now had many months and millions of people immunized and we are seeing the immunity hold for over 9 months now with sign of it dissipating and no significant numbers of adverse reactions.  During every flu season we see 2 anaphylactic reactions for every million shots given and we initially thought we would see 11 per million with the COVID vaccine but now we see only 2-3 with Moderna and 5 or so with [facility] so compared to someone is dying every 20 minutes from [place] and more deadly and infectious strains are coming it is definitely best when weighing the risks and benefits to take the shots.  Another pooled analysis of the 5 most utilized vaccines in the world shows that after full immunization so far no one has died from [place].     Carbohydrate Counting for Diabetes Mellitus, Adult  Carbohydrate counting is a method of keeping track of how many carbohydrates you eat. Eating carbohydrates naturally increases the amount of sugar (glucose) in the blood. Counting how many carbohydrates you eat helps keep your blood glucose within normal limits, which helps you manage  your diabetes (diabetes mellitus). It is important to know how many carbohydrates you can safely have in each meal. This is different for every person. A diet and nutrition specialist (registered dietitian) can help you make a meal plan and calculate how many carbohydrates you should have at each meal and snack. Carbohydrates are found in the following foods:  Grains, such as breads and cereals.  Dried beans and soy products.  Starchy vegetables, such as potatoes, peas, and corn.  Fruit and fruit juices.  Milk and yogurt.  Sweets and snack foods, such as cake, cookies, candy, chips, and soft drinks. How do I count carbohydrates? There are two ways to count carbohydrates in food. You can use either of the methods or a combination of both. Reading "Nutrition Facts" on packaged food The "Nutrition Facts" list is included on the labels of almost all packaged foods and beverages in the [place] It includes:  The serving size.  Information about nutrients in each serving, including the grams (g) of carbohydrate per serving. To use the "Nutrition Facts":  Decide how many servings you will have.  Multiply the number of servings by the number of carbohydrates per serving.  The resulting number is the total amount of carbohydrates that you will be having. Learning standard serving sizes of other foods When you eat carbohydrate foods that are not packaged or do not include "Nutrition Facts" on the label, you need to measure the servings in order to count the amount of carbohydrates:  Measure the foods that you will eat with a food scale or  measuring cup, if needed.  Decide how many standard-size servings you will eat.  Multiply the number of servings by 15. Most carbohydrate-rich foods have about 15 g of carbohydrates per serving. ? For example, if you eat 8 oz (170 g) of strawberries, you will have eaten 2 servings and 30 g of carbohydrates (2 servings x 15 g = 30 g).  For foods that have  more than one food mixed, such as soups and casseroles, you must count the carbohydrates in each food that is included. The following list contains standard serving sizes of common carbohydrate-rich foods. Each of these servings has about 15 g of carbohydrates:   hamburger bun or  English muffin.   oz (15 mL) syrup.   oz (14 g) jelly.  1 slice of bread.  1 six-inch tortilla.  3 oz (85 g) cooked rice or pasta.  4 oz (113 g) cooked dried beans.  4 oz (113 g) starchy vegetable, such as peas, corn, or potatoes.  4 oz (113 g) hot cereal.  4 oz (113 g) mashed potatoes or  of a large baked potato.  4 oz (113 g) canned or frozen fruit.  4 oz (120 mL) fruit juice.  4-6 crackers.  6 chicken nuggets.  6 oz (170 g) unsweetened dry cereal.  6 oz (170 g) plain fat-free yogurt or yogurt sweetened with artificial sweeteners.  8 oz (240 mL) milk.  8 oz (170 g) fresh fruit or one small piece of fruit.  24 oz (680 g) popped popcorn. Example of carbohydrate counting Sample meal  3 oz (85 g) chicken breast.  6 oz (170 g) brown rice.  4 oz (113 g) corn.  8 oz (240 mL) milk.  8 oz (170 g) strawberries with sugar-free whipped topping. Carbohydrate calculation 1. Identify the foods that contain carbohydrates: ? Rice. ? Corn. ? Milk. ? Strawberries. 2. Calculate how many servings you have of each food: ? 2 servings rice. ? 1 serving corn. ? 1 serving milk. ? 1 serving strawberries. 3. Multiply each number of servings by 15 g: ? 2 servings rice x 15 g = 30 g. ? 1 serving corn x 15 g = 15 g. ? 1 serving milk x 15 g = 15 g. ? 1 serving strawberries x 15 g = 15 g. 4. Add together all of the amounts to find the total grams of carbohydrates eaten: ? 30 g + 15 g + 15 g + 15 g = 75 g of carbohydrates total. Summary  Carbohydrate counting is a method of keeping track of how many carbohydrates you eat.  Eating carbohydrates naturally increases the amount of sugar (glucose)  in the blood.  Counting how many carbohydrates you eat helps keep your blood glucose within normal limits, which helps you manage your diabetes.  A diet and nutrition specialist (registered dietitian) can help you make a meal plan and calculate how many carbohydrates you should have at each meal and snack. This information is not intended to replace advice given to you by your health care provider. Make sure you discuss any questions you have with your health care provider. Document Revised: 01/19/2017 Document Reviewed: 12/09/2015 Elsevier Patient Education  [place].

## 2019-10-25 ENCOUNTER — Other Ambulatory Visit (INDEPENDENT_AMBULATORY_CARE_PROVIDER_SITE_OTHER): Payer: Medicare Other

## 2019-10-25 DIAGNOSIS — K625 Hemorrhage of anus and rectum: Secondary | ICD-10-CM | POA: Diagnosis not present

## 2019-10-25 DIAGNOSIS — D649 Anemia, unspecified: Secondary | ICD-10-CM | POA: Diagnosis not present

## 2019-10-25 LAB — FECAL OCCULT BLOOD, IMMUNOCHEMICAL: Fecal Occult Bld: NEGATIVE

## 2019-10-30 ENCOUNTER — Other Ambulatory Visit: Payer: Self-pay | Admitting: Family Medicine

## 2019-11-11 ENCOUNTER — Ambulatory Visit (INDEPENDENT_AMBULATORY_CARE_PROVIDER_SITE_OTHER): Payer: Medicare Other | Admitting: Podiatry

## 2019-11-11 ENCOUNTER — Other Ambulatory Visit: Payer: Self-pay

## 2019-11-11 ENCOUNTER — Encounter: Payer: Self-pay | Admitting: Podiatry

## 2019-11-11 DIAGNOSIS — B351 Tinea unguium: Secondary | ICD-10-CM

## 2019-11-11 DIAGNOSIS — M79674 Pain in right toe(s): Secondary | ICD-10-CM

## 2019-11-11 DIAGNOSIS — M79675 Pain in left toe(s): Secondary | ICD-10-CM

## 2019-11-11 NOTE — Patient Instructions (Signed)
Diabetes Mellitus and Foot Care Foot care is an important part of your health, especially when you have diabetes. Diabetes may cause you to have problems because of poor blood flow (circulation) to your feet and legs, which can cause your skin to:  Become thinner and drier.  Break more easily.  Heal more slowly.  Peel and crack. You may also have nerve damage (neuropathy) in your legs and feet, causing decreased feeling in them. This means that you may not notice minor injuries to your feet that could lead to more serious problems. Noticing and addressing any potential problems early is the best way to prevent future foot problems. How to care for your feet Foot hygiene  Wash your feet daily with warm water and mild soap. Do not use hot water. Then, pat your feet and the areas between your toes until they are completely dry. Do not soak your feet as this can dry your skin.  Trim your toenails straight across. Do not dig under them or around the cuticle. File the edges of your nails with an emery board or nail file.  Apply a moisturizing lotion or petroleum jelly to the skin on your feet and to dry, brittle toenails. Use lotion that does not contain alcohol and is unscented. Do not apply lotion between your toes. Shoes and socks  Wear clean socks or stockings every day. Make sure they are not too tight. Do not wear knee-high stockings since they may decrease blood flow to your legs.  Wear shoes that fit properly and have enough cushioning. Always look in your shoes before you put them on to be sure there are no objects inside.  To break in new shoes, wear them for just a few hours a day. This prevents injuries on your feet. Wounds, scrapes, corns, and calluses  Check your feet daily for blisters, cuts, bruises, sores, and redness. If you cannot see the bottom of your feet, use a mirror or ask someone for help.  Do not cut corns or calluses or try to remove them with medicine.  If you  find a minor scrape, cut, or break in the skin on your feet, keep it and the skin around it clean and dry. You may clean these areas with mild soap and water. Do not clean the area with peroxide, alcohol, or iodine.  If you have a wound, scrape, corn, or callus on your foot, look at it several times a day to make sure it is healing and not infected. Check for: ? Redness, swelling, or pain. ? Fluid or blood. ? Warmth. ? Pus or a bad smell. General instructions  Do not cross your legs. This may decrease blood flow to your feet.  Do not use heating pads or hot water bottles on your feet. They may burn your skin. If you have lost feeling in your feet or legs, you may not know this is happening until it is too late.  Protect your feet from hot and cold by wearing shoes, such as at the beach or on hot pavement.  Schedule a complete foot exam at least once a year (annually) or more often if you have foot problems. If you have foot problems, report any cuts, sores, or bruises to your health care provider immediately. Contact a health care provider if:  You have a medical condition that increases your risk of infection and you have any cuts, sores, or bruises on your feet.  You have an injury that is not   healing.  You have redness on your legs or feet.  You feel burning or tingling in your legs or feet.  You have pain or cramps in your legs and feet.  Your legs or feet are numb.  Your feet always feel cold.  You have pain around a toenail. Get help right away if:  You have a wound, scrape, corn, or callus on your foot and: ? You have pain, swelling, or redness that gets worse. ? You have fluid or blood coming from the wound, scrape, corn, or callus. ? Your wound, scrape, corn, or callus feels warm to the touch. ? You have pus or a bad smell coming from the wound, scrape, corn, or callus. ? You have a fever. ? You have a red line going up your leg. Summary  Check your feet every day  for cuts, sores, red spots, swelling, and blisters.  Moisturize feet and legs daily.  Wear shoes that fit properly and have enough cushioning.  If you have foot problems, report any cuts, sores, or bruises to your health care provider immediately.  Schedule a complete foot exam at least once a year (annually) or more often if you have foot problems. This information is not intended to replace advice given to you by your health care provider. Make sure you discuss any questions you have with your health care provider. Document Revised: 03/20/2019 Document Reviewed: 07/29/2016 Elsevier Patient Education  2020 [employer] Inc.  

## 2019-11-11 NOTE — Progress Notes (Signed)
Subjective: Evan Best presents today for follow up of preventative diabetic foot care, follow up left ankle pain and painful mycotic nails b/l that are difficult to trim. Pain interferes with ambulation. Aggravating factors include wearing enclosed shoe gear. Pain is relieved with periodic professional debridement.   He states his left ankle is feeling much better and symptoms have resolved.  No Known Allergies   Objective: There were no vitals filed for this visit.  Pt is a pleasant 69 y.o. year old Caucasian male WD, WN in NAD. AAO x 3.   Vascular Examination:  Capillary fill time to digits <3 seconds b/l. Palpable DP pulses b/l. Palpable PT pulses b/l. Pedal hair sparse b/l. Skin temperature gradient within normal limits b/l. No edema noted b/l.  Dermatological Examination: Pedal skin with normal turgor, texture and tone bilaterally. No open wounds bilaterally. No interdigital macerations bilaterally. Toenails 1-5 b/l elongated, dystrophic, thickened, crumbly with subungual debris and tenderness to dorsal palpation.  Musculoskeletal: Normal muscle strength 5/5 to all lower extremity muscle groups bilaterally. No gross bony deformities bilaterally. No pain crepitus or joint limitation noted with ROM b/l.  Neurological: Protective sensation intact 5/5 intact bilaterally with 10g monofilament b/l. Vibratory sensation intact b/l.  Assessment: 1. Pain due to onychomycosis of toenails of both feet    Plan: -No new findings. No new orders. -Continue diabetic foot care principles. Literature dispensed on today.  -Toenails 1-5 b/l were debrided in length and girth with sterile nail nippers and dremel without iatrogenic bleeding.  -Patient to continue soft, supportive shoe gear daily. -Patient to report any pedal injuries to medical professional immediately. -Patient/POA to call should there be question/concern in the interim.  Return in about 3 months (around 02/11/2020) for  diabetic nail trim.  Marzetta Board, DPM

## 2019-11-15 ENCOUNTER — Other Ambulatory Visit: Payer: Self-pay | Admitting: Family Medicine

## 2019-12-03 ENCOUNTER — Other Ambulatory Visit: Payer: Self-pay | Admitting: Family Medicine

## 2019-12-03 ENCOUNTER — Other Ambulatory Visit (INDEPENDENT_AMBULATORY_CARE_PROVIDER_SITE_OTHER): Payer: Medicare Other

## 2019-12-03 DIAGNOSIS — R519 Headache, unspecified: Secondary | ICD-10-CM | POA: Diagnosis not present

## 2019-12-03 DIAGNOSIS — H4922 Sixth [abducent] nerve palsy, left eye: Secondary | ICD-10-CM | POA: Insufficient documentation

## 2019-12-03 LAB — CBC WITH DIFFERENTIAL/PLATELET
Basophils Absolute: 0.1 10*3/uL (ref 0.0–0.1)
Basophils Relative: 0.9 % (ref 0.0–3.0)
Eosinophils Absolute: 0.3 10*3/uL (ref 0.0–0.7)
Eosinophils Relative: 4.1 % (ref 0.0–5.0)
HCT: 36.6 % — ABNORMAL LOW (ref 39.0–52.0)
Hemoglobin: 12.4 g/dL — ABNORMAL LOW (ref 13.0–17.0)
Lymphocytes Relative: 26.1 % (ref 12.0–46.0)
Lymphs Abs: 2 10*3/uL (ref 0.7–4.0)
MCHC: 33.8 g/dL (ref 30.0–36.0)
MCV: 72.3 fl — ABNORMAL LOW (ref 78.0–100.0)
Monocytes Absolute: 1 10*3/uL (ref 0.1–1.0)
Monocytes Relative: 13.1 % — ABNORMAL HIGH (ref 3.0–12.0)
Neutro Abs: 4.4 10*3/uL (ref 1.4–7.7)
Neutrophils Relative %: 55.8 % (ref 43.0–77.0)
Platelets: 339 10*3/uL (ref 150.0–400.0)
RBC: 5.07 Mil/uL (ref 4.22–5.81)
RDW: 15.6 % — ABNORMAL HIGH (ref 11.5–15.5)
WBC: 7.8 10*3/uL (ref 4.0–10.5)

## 2019-12-03 LAB — COMPREHENSIVE METABOLIC PANEL
ALT: 21 U/L (ref 0–53)
AST: 18 U/L (ref 0–37)
Albumin: 4.2 g/dL (ref 3.5–5.2)
Alkaline Phosphatase: 63 U/L (ref 39–117)
BUN: 18 mg/dL (ref 6–23)
CO2: 25 mEq/L (ref 19–32)
Calcium: 9.1 mg/dL (ref 8.4–10.5)
Chloride: 102 mEq/L (ref 96–112)
Creatinine, Ser: 1.11 mg/dL (ref 0.40–1.50)
GFR: 65.88 mL/min (ref >60.00)
Glucose, Bld: 220 mg/dL — ABNORMAL HIGH (ref 70–99)
Potassium: 3.8 mEq/L (ref 3.5–5.1)
Sodium: 137 mEq/L (ref 135–145)
Total Bilirubin: 0.4 mg/dL (ref 0.2–1.2)
Total Protein: 6.9 g/dL (ref 6.0–8.3)

## 2019-12-03 LAB — HM DIABETES EYE EXAM

## 2019-12-03 LAB — SEDIMENTATION RATE: Sed Rate: 9 mm/hr (ref 0–20)

## 2019-12-03 LAB — C-REACTIVE PROTEIN: CRP: <1 mg/dL (ref 0.5–20.0)

## 2019-12-03 NOTE — Progress Notes (Unsigned)
cbc

## 2019-12-04 ENCOUNTER — Encounter: Payer: Self-pay | Admitting: *Deleted

## 2019-12-04 DIAGNOSIS — H4922 Sixth [abducent] nerve palsy, left eye: Secondary | ICD-10-CM

## 2019-12-05 ENCOUNTER — Ambulatory Visit (HOSPITAL_COMMUNITY)
Admission: RE | Admit: 2019-12-05 | Discharge: 2019-12-05 | Disposition: A | Discharge status: Home / Self Care | Payer: Medicare Other | Source: Ambulatory Visit | Attending: Family Medicine | Admitting: Family Medicine

## 2019-12-05 ENCOUNTER — Other Ambulatory Visit: Payer: Self-pay

## 2019-12-05 DIAGNOSIS — R519 Headache, unspecified: Secondary | ICD-10-CM | POA: Diagnosis not present

## 2019-12-05 MED ORDER — GADOBUTROL 1 MMOL/ML IV SOLN
9.6000 mL | Freq: Once | INTRAVENOUS | Status: AC | PRN
  Administered 2019-12-05: 9.6 mL via INTRAVENOUS

## 2019-12-20 ENCOUNTER — Other Ambulatory Visit: Payer: Self-pay | Admitting: Family Medicine

## 2020-01-23 ENCOUNTER — Other Ambulatory Visit: Payer: Self-pay | Admitting: Family Medicine

## 2020-02-10 ENCOUNTER — Encounter: Payer: Self-pay | Admitting: Podiatry

## 2020-02-10 ENCOUNTER — Ambulatory Visit (INDEPENDENT_AMBULATORY_CARE_PROVIDER_SITE_OTHER): Payer: Medicare Other | Admitting: Podiatry

## 2020-02-10 ENCOUNTER — Other Ambulatory Visit: Payer: Self-pay

## 2020-02-10 DIAGNOSIS — M79675 Pain in left toe(s): Secondary | ICD-10-CM | POA: Diagnosis not present

## 2020-02-10 DIAGNOSIS — L84 Corns and callosities: Secondary | ICD-10-CM

## 2020-02-10 DIAGNOSIS — B351 Tinea unguium: Secondary | ICD-10-CM | POA: Diagnosis not present

## 2020-02-10 DIAGNOSIS — E119 Type 2 diabetes mellitus without complications: Secondary | ICD-10-CM | POA: Diagnosis not present

## 2020-02-10 DIAGNOSIS — M79674 Pain in right toe(s): Secondary | ICD-10-CM | POA: Diagnosis not present

## 2020-02-11 NOTE — Progress Notes (Signed)
Subjective: Evan Best presents today for follow up of preventative diabetic foot care, follow up left ankle pain and painful mycotic nails b/l that are difficult to trim. Pain interferes with ambulation. Aggravating factors include wearing enclosed shoe gear. Pain is relieved with periodic professional debridement.   He voices no new pedal problems on today's visit.  No Known Allergies   PCP is Dr. Willette Alma.  Last visit October 24, 2019.  Objective:  There were no vitals filed for this visit.  Pt is a pleasant 69 y.o. year old Caucasian male WD, WN in NAD. AAO x 3.   Vascular Examination:  Capillary fill time to digits <3 seconds b/l. Palpable DP pulses b/l. Palpable PT pulses b/l. Pedal hair sparse b/l. Skin temperature gradient within normal limits b/l. No edema noted b/l.  Dermatological Examination: Pedal skin with normal turgor, texture and tone bilaterally. No open wounds bilaterally. No interdigital macerations bilaterally. Toenails 1-5 b/l elongated, discolored, dystrophic, thickened, crumbly with subungual debris and tenderness to dorsal palpation. Hyperkeratotic lesion(s) R hallux.  No erythema, no edema, no drainage, no flocculence.  Musculoskeletal: Normal muscle strength 5/5 to all lower extremity muscle groups bilaterally. No gross bony deformities bilaterally. No pain crepitus or joint limitation noted with ROM b/l.  Neurological: Protective sensation intact 5/5 intact bilaterally with 10g monofilament b/l. Vibratory sensation intact b/l.  Assessment: 1. Pain due to onychomycosis of toenails of both feet   2. Callus   3. Controlled type 2 diabetes mellitus without complication, without long-term current use of insulin ([place])    Plan: -Continue diabetic foot care principles. -Toenails 1-5 b/l were debrided in length and girth with sterile nail nippers and dremel without iatrogenic bleeding.  -Callus(es) R hallux pared utilizing sterile scalpel blade without  complication or incident. Total number debrided =1. -Patient to report any pedal injuries to medical professional immediately. -Patient to continue soft, supportive shoe gear daily. -Patient/POA to call should there be question/concern in the interim.  Return in about 3 months (around 05/12/2020) for diabetic nail trim.  Marzetta Board, DPM

## 2020-03-24 ENCOUNTER — Ambulatory Visit: Payer: Self-pay | Admitting: *Deleted

## 2020-03-31 DIAGNOSIS — H4922 Sixth [abducent] nerve palsy, left eye: Secondary | ICD-10-CM | POA: Diagnosis not present

## 2020-03-31 DIAGNOSIS — H532 Diplopia: Secondary | ICD-10-CM | POA: Diagnosis not present

## 2020-03-31 DIAGNOSIS — H5000 Unspecified esotropia: Secondary | ICD-10-CM | POA: Diagnosis not present

## 2020-03-31 DIAGNOSIS — H5022 Vertical strabismus, left eye: Secondary | ICD-10-CM | POA: Diagnosis not present

## 2020-04-02 DIAGNOSIS — L814 Other melanin hyperpigmentation: Secondary | ICD-10-CM | POA: Diagnosis not present

## 2020-04-02 DIAGNOSIS — L821 Other seborrheic keratosis: Secondary | ICD-10-CM | POA: Diagnosis not present

## 2020-04-02 DIAGNOSIS — L7211 Pilar cyst: Secondary | ICD-10-CM | POA: Diagnosis not present

## 2020-04-02 DIAGNOSIS — D225 Melanocytic nevi of trunk: Secondary | ICD-10-CM | POA: Diagnosis not present

## 2020-04-02 DIAGNOSIS — D1801 Hemangioma of skin and subcutaneous tissue: Secondary | ICD-10-CM | POA: Diagnosis not present

## 2020-04-02 DIAGNOSIS — D234 Other benign neoplasm of skin of scalp and neck: Secondary | ICD-10-CM | POA: Diagnosis not present

## 2020-04-02 DIAGNOSIS — I878 Other specified disorders of veins: Secondary | ICD-10-CM | POA: Diagnosis not present

## 2020-04-02 DIAGNOSIS — L723 Sebaceous cyst: Secondary | ICD-10-CM | POA: Diagnosis not present

## 2020-04-22 ENCOUNTER — Telehealth: Payer: Self-pay | Admitting: Family Medicine

## 2020-04-22 ENCOUNTER — Other Ambulatory Visit: Payer: Self-pay | Admitting: Family Medicine

## 2020-04-22 NOTE — Telephone Encounter (Signed)
Yes OK to add on a testosterone

## 2020-04-22 NOTE — Telephone Encounter (Signed)
Caller name: Kitt Call back number: [phone]  Patient states he when to donate blood and was recommended to have his testosterone check. He will like that test along with his normal lab to be done prior to his appt on 04/28/20.

## 2020-04-24 ENCOUNTER — Other Ambulatory Visit (INDEPENDENT_AMBULATORY_CARE_PROVIDER_SITE_OTHER): Payer: Medicare Other

## 2020-04-24 ENCOUNTER — Other Ambulatory Visit: Payer: Self-pay

## 2020-04-24 DIAGNOSIS — E669 Obesity, unspecified: Secondary | ICD-10-CM

## 2020-04-24 DIAGNOSIS — E782 Mixed hyperlipidemia: Secondary | ICD-10-CM

## 2020-04-24 DIAGNOSIS — D649 Anemia, unspecified: Secondary | ICD-10-CM | POA: Diagnosis not present

## 2020-04-24 DIAGNOSIS — I10 Essential (primary) hypertension: Secondary | ICD-10-CM | POA: Diagnosis not present

## 2020-04-24 DIAGNOSIS — R350 Frequency of micturition: Secondary | ICD-10-CM

## 2020-04-24 DIAGNOSIS — E1169 Type 2 diabetes mellitus with other specified complication: Secondary | ICD-10-CM | POA: Diagnosis not present

## 2020-04-24 DIAGNOSIS — M1A9XX0 Chronic gout, unspecified, without tophus (tophi): Secondary | ICD-10-CM

## 2020-04-24 LAB — COMPREHENSIVE METABOLIC PANEL
ALT: 20 U/L (ref 0–53)
AST: 16 U/L (ref 0–37)
Albumin: 4.1 g/dL (ref 3.5–5.2)
Alkaline Phosphatase: 57 U/L (ref 39–117)
BUN: 15 mg/dL (ref 6–23)
CO2: 29 mEq/L (ref 19–32)
Calcium: 9 mg/dL (ref 8.4–10.5)
Chloride: 102 mEq/L (ref 96–112)
Creatinine, Ser: 1.13 mg/dL (ref 0.40–1.50)
GFR: 66.24 mL/min (ref >60.00)
Glucose, Bld: 122 mg/dL — ABNORMAL HIGH (ref 70–99)
Potassium: 3.7 mEq/L (ref 3.5–5.1)
Sodium: 138 mEq/L (ref 135–145)
Total Bilirubin: 0.4 mg/dL (ref 0.2–1.2)
Total Protein: 7 g/dL (ref 6.0–8.3)

## 2020-04-24 LAB — TSH: TSH: 2.56 u[IU]/mL (ref 0.35–4.50)

## 2020-04-24 LAB — LIPID PANEL
Cholesterol: 113 mg/dL (ref 0–200)
HDL: 29.2 mg/dL — ABNORMAL LOW (ref >39.00)
LDL Cholesterol: 67 mg/dL (ref 0–99)
NonHDL: 84.28
Total CHOL/HDL Ratio: 4
Triglycerides: 85 mg/dL (ref 0.0–149.0)
VLDL: 17 mg/dL (ref 0.0–40.0)

## 2020-04-24 LAB — CBC
HCT: 38.3 % — ABNORMAL LOW (ref 39.0–52.0)
Hemoglobin: 12.6 g/dL — ABNORMAL LOW (ref 13.0–17.0)
MCHC: 32.9 g/dL (ref 30.0–36.0)
MCV: 71.7 fl — ABNORMAL LOW (ref 78.0–100.0)
Platelets: 310 10*3/uL (ref 150.0–400.0)
RBC: 5.34 Mil/uL (ref 4.22–5.81)
RDW: 17.6 % — ABNORMAL HIGH (ref 11.5–15.5)
WBC: 8.3 10*3/uL (ref 4.0–10.5)

## 2020-04-24 LAB — URIC ACID: Uric Acid, Serum: 6.4 mg/dL (ref 4.0–7.8)

## 2020-04-24 LAB — TESTOSTERONE: Testosterone: 320.64 ng/dL (ref 300.00–890.00)

## 2020-04-24 LAB — HEMOGLOBIN A1C: Hgb A1c MFr Bld: 7.5 % — ABNORMAL HIGH (ref 4.6–6.5)

## 2020-04-24 NOTE — Telephone Encounter (Signed)
Lab called and order added to blood draw

## 2020-04-25 LAB — IRON,TIBC AND FERRITIN PANEL
%SAT: 6 % (calc) — ABNORMAL LOW (ref 20–48)
Ferritin: 8 ng/mL — ABNORMAL LOW (ref 24–380)
Iron: 27 ug/dL — ABNORMAL LOW (ref 50–180)
TIBC: 438 mcg/dL (calc) — ABNORMAL HIGH (ref 250–425)

## 2020-04-27 ENCOUNTER — Other Ambulatory Visit: Payer: Self-pay

## 2020-04-27 DIAGNOSIS — E611 Iron deficiency: Secondary | ICD-10-CM

## 2020-04-27 MED ORDER — FERROUS FUMARATE 325 (106 FE) MG PO TABS: 1.0000 | ORAL_TABLET | Freq: Every day | ORAL | Status: DC

## 2020-04-28 ENCOUNTER — Other Ambulatory Visit: Payer: Self-pay

## 2020-04-28 ENCOUNTER — Ambulatory Visit (INDEPENDENT_AMBULATORY_CARE_PROVIDER_SITE_OTHER): Payer: Medicare Other | Admitting: Family Medicine

## 2020-04-28 ENCOUNTER — Encounter: Payer: Self-pay | Admitting: Family Medicine

## 2020-04-28 VITALS — BP 136/74 | HR 90 | Temp 98.0°F | Resp 16 | Wt 205.8 lb

## 2020-04-28 DIAGNOSIS — E669 Obesity, unspecified: Secondary | ICD-10-CM | POA: Diagnosis not present

## 2020-04-28 DIAGNOSIS — E1169 Type 2 diabetes mellitus with other specified complication: Secondary | ICD-10-CM

## 2020-04-28 DIAGNOSIS — R600 Localized edema: Secondary | ICD-10-CM

## 2020-04-28 DIAGNOSIS — E782 Mixed hyperlipidemia: Secondary | ICD-10-CM | POA: Diagnosis not present

## 2020-04-28 DIAGNOSIS — R739 Hyperglycemia, unspecified: Secondary | ICD-10-CM

## 2020-04-28 DIAGNOSIS — R252 Cramp and spasm: Secondary | ICD-10-CM | POA: Diagnosis not present

## 2020-04-28 DIAGNOSIS — I10 Essential (primary) hypertension: Secondary | ICD-10-CM

## 2020-04-28 DIAGNOSIS — M1A9XX0 Chronic gout, unspecified, without tophus (tophi): Secondary | ICD-10-CM

## 2020-04-28 DIAGNOSIS — F32A Depression, unspecified: Secondary | ICD-10-CM | POA: Diagnosis not present

## 2020-04-28 DIAGNOSIS — M79662 Pain in left lower leg: Secondary | ICD-10-CM

## 2020-04-28 DIAGNOSIS — D649 Anemia, unspecified: Secondary | ICD-10-CM

## 2020-04-28 NOTE — Assessment & Plan Note (Signed)
Mild, recurrent over a lifetime. He feels is managing OK but felt he should bring it up. He does

## 2020-04-28 NOTE — Progress Notes (Signed)
Subjective:    Patient ID: Evan Best, male    DOB: 11-26-1950, 69 y.o.   MRN: [id number]  Chief Complaint  Patient presents with  . Follow-up    Pain and swelling in left knee.  . Diabetes  . Hyperlipidemia    HPI Patient is in today for follow up on chronic medical concerns. No recent febrile illness or hospitalizations. He is noting an increased level of pain in his left calf and hamstring just above his posterior knee for about 2 weeks. Otherwise he reports he has been doing well. Denies CP/palp/SOB/HA/congestion/fevers/GI or GU c/o. Taking meds as prescribed  Past Medical History:  Diagnosis Date  . Allergic state 09/22/2013  . Anemia 09/07/2015  . Arthritis 03/06/2015  . Diabetes mellitus type II 1/09  . Dysphagia 05/04/2017  . Esophageal reflux 03/06/2015  . Gout 09/18/2012   Left big toe  . Hypertension   . Insomnia 09/13/2015  . Migraine headache    remote history  . OSA (obstructive sleep apnea)   . Preventative health care 09/05/2014  . Right shoulder pain 02/19/2009   Qualifier: Diagnosis of  By: Wynona Luna   . RLS (restless legs syndrome) 09/07/2015  . Sleep apnea    uses cpap  . Tremor 09/07/2015    Past Surgical History:  Procedure Laterality Date  . HERNIA REPAIR  5993   umbilical hernia repair  . KNEE SURGERY     left knee surgery  . TONSILLECTOMY  1959  . VASECTOMY      Family History  Problem Relation Age of Onset  . Breast cancer Mother   . Hypertension Mother   . Hyperlipidemia Father   . Kidney disease Father   . Heart disease Father   . Hypertension Father   . Colon cancer Neg Hx   . Stomach cancer Neg Hx     Social History   Socioeconomic History  . Marital status: Married    Spouse name: Not on file  . Number of children: Not on file  . Years of education: Not on file  . Highest education level: Not on file  Occupational History  . Not on file  Tobacco Use  . Smoking status: Never Smoker  . Smokeless tobacco: Never  Used  Substance and Sexual Activity  . Alcohol use: No  . Drug use: No  . Sexual activity: Yes  Other Topics Concern  . Not on file  Social History Narrative   Occupation:  Chemical engineer   Married  -  1 daughter (age 56)    Never Smoked   Alcohol use-no     Drug use-no          Social Determinants of Health   Financial Resource Strain:   . Difficulty of Paying Living Expenses: Not on file  Food Insecurity:   . Worried About Charity fundraiser in the Last Year: Not on file  . Ran Out of Food in the Last Year: Not on file  Transportation Needs:   . Lack of Transportation (Medical): Not on file  . Lack of Transportation (Non-Medical): Not on file  Physical Activity:   . Days of Exercise per Week: Not on file  . Minutes of Exercise per Session: Not on file  Stress:   . Feeling of Stress : Not on file  Social Connections:   . Frequency of Communication with Friends and Family: Not on file  . Frequency of Social Gatherings with Friends and Family:  Not on file  . Attends Religious Services: Not on file  . Active Member of Clubs or Organizations: Not on file  . Attends Archivist Meetings: Not on file  . Marital Status: Not on file  Intimate Partner Violence:   . Fear of Current or Ex-Partner: Not on file  . Emotionally Abused: Not on file  . Physically Abused: Not on file  . Sexually Abused: Not on file    Outpatient Medications Prior to Visit  Medication Sig Dispense Refill  . amLODipine (NORVASC) 10 MG tablet TAKE 1 TABLET BY MOUTH EVERY DAY 90 tablet 1  . Calcium-Magnesium-Zinc 1000-400-15 MG TABS Take by mouth daily.      . cetirizine (ZYRTEC) 10 MG tablet Take 10 mg by mouth daily.    . Chelated Potassium 99 MG TABS Take 99 mg by mouth 2 (two) times daily.     . Cholecalciferol (D3 ADULT PO) Take by mouth.    . Cinnamon 500 MG capsule Take 500 mg 3 (three) times daily by mouth. +50 mg Chromium in each supplement.    . Coenzyme Q10 (CO Q-10) 100 MG  CAPS Take 200 mg daily by mouth.     . Cyanocobalamin (VITAMIN B 12 PO) Take 1 capsule daily by mouth.    . famotidine (PEPCID) 40 MG tablet TAKE 1 TABLET BY MOUTH EVERY DAY 90 tablet 1  . Glucosamine-Chondroitin-Vit D3 1500-1200-800 MG-MG-UNIT PACK Take by mouth.    Marland Kitchen glucose blood test strip One touch Ultra blue -Check sugars daily-  DX E11.69 100 each 11  . lisinopril (ZESTRIL) 40 MG tablet TAKE 1 TABLET BY MOUTH EVERY DAY 90 tablet 1  . Melatonin 10 MG TABS Take by mouth.    . metFORMIN (GLUCOPHAGE) 500 MG tablet TAKE 2 TABLETS (1,000 MG TOTAL) BY MOUTH 2 (TWO) TIMES DAILY WITH A MEAL. 360 tablet 1  . Misc Natural Products (TART CHERRY ADVANCED PO) Take by mouth.    . Omega-3 Fatty Acids (FISH OIL PO) Take 1,500 mg by mouth daily.      . Quercetin 250 MG TABS Take by mouth 2 (two) times daily.     . simvastatin (ZOCOR) 10 MG tablet TAKE 1 TABLET BY MOUTH EVERYDAY AT BEDTIME 90 tablet 1  . TURMERIC PO Take by mouth.    . vitamin C (ASCORBIC ACID) 500 MG tablet Take 500 mg by mouth 3 (three) times daily.     . ONE TOUCH ULTRA TEST test strip USE TO CHECK BLOOD SUGAR DAILY AS NEEDED 100 each 5   Facility-Administered Medications Prior to Visit  Medication Dose Route Frequency Provider Last Rate Last Admin  . ferrous fumarate (HEMOCYTE - 106 mg FE) tablet 106 mg of iron  1 tablet Oral Daily [name], MD        No Known Allergies  Review of Systems  Constitutional: Negative for fever and malaise/fatigue.  HENT: Negative for congestion.   Eyes: Negative for blurred vision.  Respiratory: Negative for shortness of breath.   Cardiovascular: Negative for chest pain, palpitations and leg swelling.  Gastrointestinal: Negative for abdominal pain, blood in stool and nausea.  Genitourinary: Negative for dysuria and frequency.  Musculoskeletal: Positive for joint pain and myalgias. Negative for falls.  Skin: Negative for rash.  Neurological: Negative for dizziness, loss of consciousness  and headaches.  Endo/Heme/Allergies: Negative for environmental allergies.  Psychiatric/Behavioral: Negative for depression. The patient is not nervous/anxious.        Objective:  Physical Exam Vitals and nursing note reviewed.  Constitutional:      General: He is not in acute distress.    Appearance: He is well-developed.  HENT:     Head: Normocephalic and atraumatic.     Nose: Nose normal.  Eyes:     General:        Right eye: No discharge.        Left eye: No discharge.  Cardiovascular:     Rate and Rhythm: Normal rate and regular rhythm.     Heart sounds: No murmur heard.   Pulmonary:     Effort: Pulmonary effort is normal.     Breath sounds: Normal breath sounds.  Abdominal:     General: Bowel sounds are normal.     Palpations: Abdomen is soft.     Tenderness: There is no abdominal tenderness.  Musculoskeletal:     Cervical back: Normal range of motion and neck supple.  Skin:    General: Skin is warm and dry.  Neurological:     Mental Status: He is alert and oriented to person, place, and time.     BP 136/74   Pulse 90   Temp 98 F (36.7 C) (Oral)   Resp 16   Wt 205 lb 12.8 oz (93.4 kg)   SpO2 99%   BMI 28.70 kg/m  Wt Readings from Last 3 Encounters:  04/28/20 205 lb 12.8 oz (93.4 kg)  10/24/19 213 lb (96.6 kg)  10/03/19 213 lb (96.6 kg)    Diabetic Foot Exam - Simple   No data filed     Lab Results  Component Value Date   WBC 8.3 04/24/2020   HGB 12.6 (L) 04/24/2020   HCT 38.3 (L) 04/24/2020   PLT 310.0 04/24/2020   GLUCOSE 122 (H) 04/24/2020   CHOL 113 04/24/2020   TRIG 85.0 04/24/2020   HDL 29.20 (L) 04/24/2020   LDLDIRECT 74.0 08/03/2017   LDLCALC 67 04/24/2020   ALT 20 04/24/2020   AST 16 04/24/2020   NA 138 04/24/2020   K 3.7 04/24/2020   CL 102 04/24/2020   CREATININE 1.13 04/24/2020   BUN 15 04/24/2020   CO2 29 04/24/2020   TSH 2.56 04/24/2020   PSA 0.61 02/08/2018   HGBA1C 7.5 (H) 04/24/2020   MICROALBUR 1.66 03/15/2013     Lab Results  Component Value Date   TSH 2.56 04/24/2020   Lab Results  Component Value Date   WBC 8.3 04/24/2020   HGB 12.6 (L) 04/24/2020   HCT 38.3 (L) 04/24/2020   MCV 71.7 (L) 04/24/2020   PLT 310.0 04/24/2020   Lab Results  Component Value Date   NA 138 04/24/2020   K 3.7 04/24/2020   CO2 29 04/24/2020   GLUCOSE 122 (H) 04/24/2020   BUN 15 04/24/2020   CREATININE 1.13 04/24/2020   BILITOT 0.4 04/24/2020   ALKPHOS 57 04/24/2020   AST 16 04/24/2020   ALT 20 04/24/2020   PROT 7.0 04/24/2020   ALBUMIN 4.1 04/24/2020   CALCIUM 9.0 04/24/2020   GFR 66.24 04/24/2020   Lab Results  Component Value Date   CHOL 113 04/24/2020   Lab Results  Component Value Date   HDL 29.20 (L) 04/24/2020   Lab Results  Component Value Date   LDLCALC 67 04/24/2020   Lab Results  Component Value Date   TRIG 85.0 04/24/2020   Lab Results  Component Value Date   CHOLHDL 4 04/24/2020   Lab Results  Component Value Date  HGBA1C 7.5 (H) 04/24/2020       Assessment & Plan:   Problem List Items Addressed This Visit    Hyperlipidemia, mixed    Encouraged heart healthy diet, increase exercise, avoid trans fats, consider a krill oil cap daily      Relevant Orders   Lipid panel   Essential hypertension    Well controlled, no changes to meds. Encouraged heart healthy diet such as the DASH diet and exercise as tolerated.       Relevant Orders   CBC   Comprehensive metabolic panel   TSH   MUSCLE CRAMPS   Relevant Orders   Magnesium   Gout   Relevant Orders   Uric acid   Anemia    Increase leafy greens, consider increased lean red meat and using cast iron cookware. Continue to monitor, report any concerns. He donates blood regularly.       Relevant Orders   CBC with Differential/Platelet   CBC   Pain of left calf - Primary    Worsening over last couple of weeks. Tender to touch in posterior calf. Check ultrasound and confirm no blood clot. Consider topical rubs  on leg, ie Lidocaine gel, Diclofenac gel      Relevant Orders   US Venous Img Lower Unilateral Left    Other Visit Diagnoses    Pedal edema       Relevant Orders   US Venous Img Lower Unilateral Left   Hyperglycemia       Relevant Orders   Hemoglobin A1c      I am having Evan Best maintain his Calcium-Magnesium-Zinc, Chelated Potassium, Cinnamon, Omega-3 Fatty Acids (FISH OIL PO), vitamin C, Co Q-10, cetirizine, Quercetin, Cyanocobalamin (VITAMIN B 12 PO), Glucosamine-Chondroitin-Vit D3, TURMERIC PO, Misc Natural Products (TART CHERRY ADVANCED PO), ONE TOUCH ULTRA TEST, Cholecalciferol (D3 ADULT PO), Melatonin, glucose blood, metFORMIN, lisinopril, amLODipine, simvastatin, and famotidine. We will continue to administer ferrous fumarate.  No orders of the defined types were placed in this encounter.    Penni Homans, MD

## 2020-04-28 NOTE — Assessment & Plan Note (Signed)
Encouraged heart healthy diet, increase exercise, avoid trans fats, consider a krill oil cap daily 

## 2020-04-28 NOTE — Assessment & Plan Note (Signed)
Well controlled, no changes to meds. Encouraged heart healthy diet such as the DASH diet and exercise as tolerated.  °

## 2020-04-28 NOTE — Patient Instructions (Signed)
Anemia  Anemia is a condition in which you do not have enough red blood cells or hemoglobin. Hemoglobin is a substance in red blood cells that carries oxygen. When you do not have enough red blood cells or hemoglobin (are anemic), your body cannot get enough oxygen and your organs may not work properly. As a result, you may feel very tired or have other problems. What are the causes? Common causes of anemia include:  Excessive bleeding. Anemia can be caused by excessive bleeding inside or outside the body, including bleeding from the intestine or from periods in women.  Poor nutrition.  Long-lasting (chronic) kidney, thyroid, and liver disease.  Bone marrow disorders.  Cancer and treatments for cancer.  HIV (human immunodeficiency virus) and AIDS (acquired immunodeficiency syndrome).  Treatments for HIV and AIDS.  Spleen problems.  Blood disorders.  Infections, medicines, and autoimmune disorders that destroy red blood cells. What are the signs or symptoms? Symptoms of this condition include:  Minor weakness.  Dizziness.  Headache.  Feeling heartbeats that are irregular or faster than normal (palpitations).  Shortness of breath, especially with exercise.  Paleness.  Cold sensitivity.  Indigestion.  Nausea.  Difficulty sleeping.  Difficulty concentrating. Symptoms may occur suddenly or develop slowly. If your anemia is mild, you may not have symptoms. How is this diagnosed? This condition is diagnosed based on:  Blood tests.  Your medical history.  A physical exam.  Bone marrow biopsy. Your health care provider may also check your stool (feces) for blood and may do additional testing to look for the cause of your bleeding. You may also have other tests, including:  Imaging tests, such as a CT scan or MRI.  Endoscopy.  Colonoscopy. How is this treated? Treatment for this condition depends on the cause. If you continue to lose a lot of blood, you may  need to be treated at a hospital. Treatment may include:  Taking supplements of iron, vitamin I37, or folic acid.  Taking a hormone medicine (erythropoietin) that can help to stimulate red blood cell growth.  Having a blood transfusion. This may be needed if you lose a lot of blood.  Making changes to your diet.  Having surgery to remove your spleen. Follow these instructions at home:  Take over-the-counter and prescription medicines only as told by your health care provider.  Take supplements only as told by your health care provider.  Follow any diet instructions that you were given.  Keep all follow-up visits as told by your health care provider. This is important. Contact a health care provider if:  You develop new bleeding anywhere in the body. Get help right away if:  You are very weak.  You are short of breath.  You have pain in your abdomen or chest.  You are dizzy or feel faint.  You have trouble concentrating.  You have bloody or black, tarry stools.  You vomit repeatedly or you vomit up blood. Summary  Anemia is a condition in which you do not have enough red blood cells or enough of a substance in your red blood cells that carries oxygen (hemoglobin).  Symptoms may occur suddenly or develop slowly.  If your anemia is mild, you may not have symptoms.  This condition is diagnosed with blood tests as well as a medical history and physical exam. Other tests may be needed.  Treatment for this condition depends on the cause of the anemia. This information is not intended to replace advice given to you by  your health care provider. Make sure you discuss any questions you have with your health care provider. Document Revised: 06/09/2017 Document Reviewed: 07/29/2016 Elsevier Patient Education  [place].

## 2020-04-28 NOTE — Assessment & Plan Note (Signed)
Worsening over last couple of weeks. Tender to touch in posterior calf. Check ultrasound and confirm no blood clot. Consider topical rubs on leg, ie Lidocaine gel, Diclofenac gel

## 2020-04-28 NOTE — Assessment & Plan Note (Signed)
Increase leafy greens, consider increased lean red meat and using cast iron cookware. Continue to monitor, report any concerns. He donates blood regularly.

## 2020-04-29 NOTE — Assessment & Plan Note (Signed)
hgba1c acceptable, minimize simple carbs. Increase exercise as tolerated. Continue current meds 

## 2020-04-30 ENCOUNTER — Ambulatory Visit (HOSPITAL_BASED_OUTPATIENT_CLINIC_OR_DEPARTMENT_OTHER): Payer: Medicare Other

## 2020-05-04 ENCOUNTER — Other Ambulatory Visit: Payer: Self-pay

## 2020-05-04 ENCOUNTER — Ambulatory Visit (HOSPITAL_BASED_OUTPATIENT_CLINIC_OR_DEPARTMENT_OTHER)
Admission: RE | Admit: 2020-05-04 | Discharge: 2020-05-04 | Disposition: A | Discharge status: Home / Self Care | Payer: Medicare Other | Source: Ambulatory Visit | Attending: Family Medicine | Admitting: Family Medicine

## 2020-05-04 DIAGNOSIS — M7989 Other specified soft tissue disorders: Secondary | ICD-10-CM | POA: Diagnosis not present

## 2020-05-04 DIAGNOSIS — R600 Localized edema: Secondary | ICD-10-CM

## 2020-05-04 DIAGNOSIS — M79662 Pain in left lower leg: Secondary | ICD-10-CM | POA: Diagnosis not present

## 2020-05-04 DIAGNOSIS — M7122 Synovial cyst of popliteal space [Baker], left knee: Secondary | ICD-10-CM | POA: Diagnosis not present

## 2020-05-04 DIAGNOSIS — E611 Iron deficiency: Secondary | ICD-10-CM

## 2020-05-04 MED ORDER — FERROUS SULFATE 325 (65 FE) MG PO TABS: 325.0000 mg | ORAL_TABLET | Freq: Every day | ORAL | 3 refills | Status: DC

## 2020-05-12 ENCOUNTER — Encounter: Payer: Self-pay | Admitting: Podiatry

## 2020-05-12 ENCOUNTER — Ambulatory Visit (INDEPENDENT_AMBULATORY_CARE_PROVIDER_SITE_OTHER): Payer: Medicare Other | Admitting: Podiatry

## 2020-05-12 ENCOUNTER — Other Ambulatory Visit: Payer: Self-pay

## 2020-05-12 DIAGNOSIS — M79675 Pain in left toe(s): Secondary | ICD-10-CM | POA: Diagnosis not present

## 2020-05-12 DIAGNOSIS — B351 Tinea unguium: Secondary | ICD-10-CM

## 2020-05-12 DIAGNOSIS — M79674 Pain in right toe(s): Secondary | ICD-10-CM | POA: Diagnosis not present

## 2020-05-12 DIAGNOSIS — E119 Type 2 diabetes mellitus without complications: Secondary | ICD-10-CM

## 2020-05-12 DIAGNOSIS — L84 Corns and callosities: Secondary | ICD-10-CM

## 2020-05-13 ENCOUNTER — Other Ambulatory Visit: Payer: Self-pay | Admitting: Family Medicine

## 2020-05-16 NOTE — Progress Notes (Signed)
Subjective: Evan Best presents today for follow up of preventative diabetic foot care, follow up left ankle pain and painful mycotic nails b/l that are difficult to trim. Pain interferes with ambulation. Aggravating factors include wearing enclosed shoe gear. Pain is relieved with periodic professional debridement.   He voices no new pedal problems on today's visit. He did not check his blood glucose on today's visit.  No Known Allergies   PCP is Dr. Willette Alma.  Last visit 04/28/2020.  Objective:  There were no vitals filed for this visit.  Pt is a pleasant 69 y.o. year old Caucasian male WD, WN in NAD. AAO x 3.   Vascular Examination:  Capillary fill time to digits <3 seconds b/l. Palpable DP pulses b/l. Palpable PT pulses b/l. Pedal hair sparse b/l. Skin temperature gradient within normal limits b/l. No edema noted b/l.  Dermatological Examination: Pedal skin with normal turgor, texture and tone bilaterally. No open wounds bilaterally. No interdigital macerations bilaterally. Toenails 1-5 b/l elongated, discolored, dystrophic, thickened, crumbly with subungual debris and tenderness to dorsal palpation. Hyperkeratotic lesion(s) R hallux.  No erythema, no edema, no drainage, no flocculence.  Musculoskeletal: Normal muscle strength 5/5 to all lower extremity muscle groups bilaterally. No gross bony deformities bilaterally. No pain crepitus or joint limitation noted with ROM b/l.  Neurological: Protective sensation intact 5/5 intact bilaterally with 10g monofilament b/l. Vibratory sensation intact b/l.   Hemoglobin A1C Latest Ref Rng & Units 04/24/2020  HGBA1C 4.6 - 6.5 % 7.5(H)  Some recent data might be hidden    Assessment: 1. Pain due to onychomycosis of toenails of both feet   2. Callus   3. Controlled type 2 diabetes mellitus without complication, without long-term current use of insulin ([place])     Plan: -Continue diabetic foot care principles. -Toenails 1-5 b/l were  debrided in length and girth with sterile nail nippers and dremel without iatrogenic bleeding.  -Callus(es) R hallux pared utilizing sterile scalpel blade without complication or incident. Total number debrided =1. -Patient to report any pedal injuries to medical professional immediately. -Patient to continue soft, supportive shoe gear daily. -Patient/POA to call should there be question/concern in the interim.  Return in about 3 months (around 08/12/2020) for diabetic toenails.  Marzetta Board, DPM

## 2020-06-01 DIAGNOSIS — Z23 Encounter for immunization: Secondary | ICD-10-CM | POA: Diagnosis not present

## 2020-06-12 ENCOUNTER — Other Ambulatory Visit: Payer: Self-pay | Admitting: Family Medicine

## 2020-06-13 ENCOUNTER — Other Ambulatory Visit: Payer: Self-pay | Admitting: Family Medicine

## 2020-07-18 ENCOUNTER — Other Ambulatory Visit: Payer: Self-pay | Admitting: Family Medicine

## 2020-07-30 ENCOUNTER — Other Ambulatory Visit: Payer: Self-pay | Admitting: Family Medicine

## 2020-07-30 DIAGNOSIS — E611 Iron deficiency: Secondary | ICD-10-CM

## 2020-08-19 ENCOUNTER — Encounter: Payer: Self-pay | Admitting: Podiatry

## 2020-08-19 ENCOUNTER — Other Ambulatory Visit: Payer: Self-pay

## 2020-08-19 ENCOUNTER — Ambulatory Visit (INDEPENDENT_AMBULATORY_CARE_PROVIDER_SITE_OTHER): Payer: Medicare Other | Admitting: Podiatry

## 2020-08-19 DIAGNOSIS — L84 Corns and callosities: Secondary | ICD-10-CM

## 2020-08-19 DIAGNOSIS — E119 Type 2 diabetes mellitus without complications: Secondary | ICD-10-CM

## 2020-08-19 DIAGNOSIS — B351 Tinea unguium: Secondary | ICD-10-CM | POA: Diagnosis not present

## 2020-08-19 DIAGNOSIS — M79675 Pain in left toe(s): Secondary | ICD-10-CM

## 2020-08-19 DIAGNOSIS — M79674 Pain in right toe(s): Secondary | ICD-10-CM | POA: Diagnosis not present

## 2020-08-23 NOTE — Progress Notes (Signed)
Subjective: Evan Best presents today for follow up of preventative diabetic foot care, follow up left ankle pain and painful mycotic nails b/l that are difficult to trim. Pain interferes with ambulation. Aggravating factors include wearing enclosed shoe gear. Pain is relieved with periodic professional debridement.   He voices no new pedal problems on today's visit.   No Known Allergies   PCP is Dr. Willette Best.  Last visit 04/28/2020.  Objective:  There were no vitals filed for this visit.  Pt is a pleasant 70 y.o. year old Caucasian male WD, WN in NAD. AAO x 3.   Vascular Examination:  Capillary fill time to digits <3 seconds b/l. Palpable DP pulses b/l. Palpable PT pulses b/l. Pedal hair sparse b/l. Skin temperature gradient within normal limits b/l. No edema noted b/l.  Dermatological Examination: Pedal skin with normal turgor, texture and tone bilaterally. No open wounds bilaterally. No interdigital macerations bilaterally. Toenails 1-5 b/l elongated, discolored, dystrophic, thickened, crumbly with subungual debris and tenderness to dorsal palpation. Hyperkeratotic lesion(s) R hallux.  No erythema, no edema, no drainage, no flocculence.  Musculoskeletal: Normal muscle strength 5/5 to all lower extremity muscle groups bilaterally. No gross bony deformities bilaterally. No pain crepitus or joint limitation noted with ROM b/l.  Neurological: Protective sensation intact 5/5 intact bilaterally with 10g monofilament b/l. Vibratory sensation intact b/l.   Hemoglobin A1C Latest Ref Rng & Units 04/24/2020  HGBA1C 4.6 - 6.5 % 7.5(H)  Some recent data might be hidden   Assessment: 1. Pain due to onychomycosis of toenails of both feet   2. Callus   3. Controlled type 2 diabetes mellitus without complication, without long-term current use of insulin (Evan Best)     Plan: -Continue diabetic foot care principles. -Toenails 1-5 b/l were debrided in length and girth with sterile nail  nippers and dremel without iatrogenic bleeding.  -Callus(es) R hallux pared utilizing sterile scalpel blade without complication or incident. Total number debrided =1. -Patient to report any pedal injuries to medical professional immediately. -Patient to continue soft, supportive shoe gear daily. -Patient/POA to call should there be question/concern in the interim.  Return in about 3 months (around 11/16/2020).  Marzetta Board, DPM

## 2020-08-28 ENCOUNTER — Other Ambulatory Visit (INDEPENDENT_AMBULATORY_CARE_PROVIDER_SITE_OTHER): Payer: Medicare Other

## 2020-08-28 DIAGNOSIS — E782 Mixed hyperlipidemia: Secondary | ICD-10-CM | POA: Diagnosis not present

## 2020-08-28 DIAGNOSIS — I10 Essential (primary) hypertension: Secondary | ICD-10-CM

## 2020-08-28 DIAGNOSIS — D649 Anemia, unspecified: Secondary | ICD-10-CM

## 2020-08-28 DIAGNOSIS — R252 Cramp and spasm: Secondary | ICD-10-CM

## 2020-08-28 DIAGNOSIS — M1A9XX0 Chronic gout, unspecified, without tophus (tophi): Secondary | ICD-10-CM

## 2020-08-28 DIAGNOSIS — R739 Hyperglycemia, unspecified: Secondary | ICD-10-CM | POA: Diagnosis not present

## 2020-08-28 LAB — CBC WITH DIFFERENTIAL/PLATELET
Basophils Absolute: 0.1 10*3/uL (ref 0.0–0.1)
Basophils Relative: 0.9 % (ref 0.0–3.0)
Eosinophils Absolute: 0.4 10*3/uL (ref 0.0–0.7)
Eosinophils Relative: 4.1 % (ref 0.0–5.0)
HCT: 41 % (ref 39.0–52.0)
Hemoglobin: 14.7 g/dL (ref 13.0–17.0)
Lymphocytes Relative: 29.9 % (ref 12.0–46.0)
Lymphs Abs: 2.6 10*3/uL (ref 0.7–4.0)
MCHC: 35.9 g/dL (ref 30.0–36.0)
MCV: 83.8 fl (ref 78.0–100.0)
Monocytes Absolute: 0.9 10*3/uL (ref 0.1–1.0)
Monocytes Relative: 10.2 % (ref 3.0–12.0)
Neutro Abs: 4.7 10*3/uL (ref 1.4–7.7)
Neutrophils Relative %: 54.9 % (ref 43.0–77.0)
Platelets: 306 10*3/uL (ref 150.0–400.0)
RBC: 4.9 Mil/uL (ref 4.22–5.81)
RDW: 15.5 % (ref 11.5–15.5)
WBC: 8.6 10*3/uL (ref 4.0–10.5)

## 2020-08-28 LAB — COMPREHENSIVE METABOLIC PANEL
ALT: 23 U/L (ref 0–53)
AST: 18 U/L (ref 0–37)
Albumin: 4.2 g/dL (ref 3.5–5.2)
Alkaline Phosphatase: 52 U/L (ref 39–117)
BUN: 13 mg/dL (ref 6–23)
CO2: 30 mEq/L (ref 19–32)
Calcium: 9.3 mg/dL (ref 8.4–10.5)
Chloride: 102 mEq/L (ref 96–112)
Creatinine, Ser: 1.05 mg/dL (ref 0.40–1.50)
GFR: 72.83 mL/min (ref >60.00)
Glucose, Bld: 123 mg/dL — ABNORMAL HIGH (ref 70–99)
Potassium: 3.7 mEq/L (ref 3.5–5.1)
Sodium: 139 mEq/L (ref 135–145)
Total Bilirubin: 0.7 mg/dL (ref 0.2–1.2)
Total Protein: 6.6 g/dL (ref 6.0–8.3)

## 2020-08-28 LAB — LIPID PANEL
Cholesterol: 122 mg/dL (ref 0–200)
HDL: 32.1 mg/dL — ABNORMAL LOW (ref >39.00)
LDL Cholesterol: 66 mg/dL (ref 0–99)
NonHDL: 90.27
Total CHOL/HDL Ratio: 4
Triglycerides: 119 mg/dL (ref 0.0–149.0)
VLDL: 23.8 mg/dL (ref 0.0–40.0)

## 2020-08-28 LAB — URIC ACID: Uric Acid, Serum: 7.1 mg/dL (ref 4.0–7.8)

## 2020-08-28 LAB — HEMOGLOBIN A1C: Hgb A1c MFr Bld: 6.1 % (ref 4.6–6.5)

## 2020-08-28 LAB — TSH: TSH: 4.43 u[IU]/mL (ref 0.35–4.50)

## 2020-08-28 LAB — MAGNESIUM: Magnesium: 2 mg/dL (ref 1.5–2.5)

## 2020-08-28 NOTE — Addendum Note (Signed)
Addended by: Jacob Moores on: 08/28/2020 09:30 AM   Modules accepted: Orders

## 2020-09-01 ENCOUNTER — Ambulatory Visit (INDEPENDENT_AMBULATORY_CARE_PROVIDER_SITE_OTHER): Payer: Medicare Other | Admitting: Family Medicine

## 2020-09-01 ENCOUNTER — Other Ambulatory Visit: Payer: Self-pay

## 2020-09-01 DIAGNOSIS — G8929 Other chronic pain: Secondary | ICD-10-CM | POA: Diagnosis not present

## 2020-09-01 DIAGNOSIS — E611 Iron deficiency: Secondary | ICD-10-CM | POA: Diagnosis not present

## 2020-09-01 DIAGNOSIS — M25572 Pain in left ankle and joints of left foot: Secondary | ICD-10-CM

## 2020-09-01 DIAGNOSIS — R600 Localized edema: Secondary | ICD-10-CM

## 2020-09-01 DIAGNOSIS — I10 Essential (primary) hypertension: Secondary | ICD-10-CM | POA: Diagnosis not present

## 2020-09-01 DIAGNOSIS — G4700 Insomnia, unspecified: Secondary | ICD-10-CM | POA: Diagnosis not present

## 2020-09-01 DIAGNOSIS — M1A9XX0 Chronic gout, unspecified, without tophus (tophi): Secondary | ICD-10-CM

## 2020-09-01 DIAGNOSIS — M109 Gout, unspecified: Secondary | ICD-10-CM

## 2020-09-01 DIAGNOSIS — D649 Anemia, unspecified: Secondary | ICD-10-CM

## 2020-09-01 DIAGNOSIS — E782 Mixed hyperlipidemia: Secondary | ICD-10-CM | POA: Diagnosis not present

## 2020-09-01 DIAGNOSIS — E1169 Type 2 diabetes mellitus with other specified complication: Secondary | ICD-10-CM

## 2020-09-01 DIAGNOSIS — E669 Obesity, unspecified: Secondary | ICD-10-CM

## 2020-09-01 MED ORDER — FERROUS SULFATE 325 (65 FE) MG PO TABS: 325.0000 mg | ORAL_TABLET | ORAL | 3 refills | Status: DC

## 2020-09-01 NOTE — Patient Instructions (Signed)
Gout  Gout is a condition that causes painful swelling of the joints. Gout is a type of inflammation of the joints (arthritis). This condition is caused by having too much uric acid in the body. Uric acid is a chemical that forms when the body breaks down substances called purines. Purines are important for building body proteins. When the body has too much uric acid, sharp crystals can form and build up inside the joints. This causes pain and swelling. Gout attacks can happen quickly and may be very painful (acute gout). Over time, the attacks can affect more joints and become more frequent (chronic gout). Gout can also cause uric acid to build up under the skin and inside the kidneys. What are the causes? This condition is caused by too much uric acid in your blood. This can happen because:  Your kidneys do not remove enough uric acid from your blood. This is the most common cause.  Your body makes too much uric acid. This can happen with some cancers and cancer treatments. It can also occur if your body is breaking down too many red blood cells (hemolytic anemia).  You eat too many foods that are high in purines. These foods include organ meats and some seafood. Alcohol, especially beer, is also high in purines. A gout attack may be triggered by trauma or stress. What increases the risk? You are more likely to develop this condition if you:  Have a family history of gout.  Are male and middle-aged.  Are male and have gone through menopause.  Are obese.  Frequently drink alcohol, especially beer.  Are dehydrated.  Lose weight too quickly.  Have an organ transplant.  Have lead poisoning.  Take certain medicines, including aspirin, cyclosporine, diuretics, levodopa, and niacin.  Have kidney disease.  Have a skin condition called psoriasis. What are the signs or symptoms? An attack of acute gout happens quickly. It usually occurs in just one joint. The most common place is  the big toe. Attacks often start at night. Other joints that may be affected include joints of the feet, ankle, knee, fingers, wrist, or elbow. Symptoms of this condition may include:  Severe pain.  Warmth.  Swelling.  Stiffness.  Tenderness. The affected joint may be very painful to touch.  Shiny, red, or purple skin.  Chills and fever. Chronic gout may cause symptoms more frequently. More joints may be involved. You may also have white or yellow lumps (tophi) on your hands or feet or in other areas near your joints.   How is this diagnosed? This condition is diagnosed based on your symptoms, medical history, and physical exam. You may have tests, such as:  Blood tests to measure uric acid levels.  Removal of joint fluid with a thin needle (aspiration) to look for uric acid crystals.  X-rays to look for joint damage. How is this treated? Treatment for this condition has two phases: treating an acute attack and preventing future attacks. Acute gout treatment may include medicines to reduce pain and swelling, including:  NSAIDs.  Steroids. These are strong anti-inflammatory medicines that can be taken by mouth (orally) or injected into a joint.  Colchicine. This medicine relieves pain and swelling when it is taken soon after an attack. It can be given by mouth or through an IV. Preventive treatment may include:  Daily use of smaller doses of NSAIDs or colchicine.  Use of a medicine that reduces uric acid levels in your blood.  Changes to your diet.   You may need to see a dietitian about what to eat and drink to prevent gout. Follow these instructions at home: During a gout attack  If directed, put ice on the affected area: ? Put ice in a plastic bag. ? Place a towel between your skin and the bag. ? Leave the ice on for 20 minutes, 2-3 times a day.  Raise (elevate) the affected joint above the level of your heart as often as possible.  Rest the joint as much as possible.  If the affected joint is in your leg, you may be given crutches to use.  Follow instructions from your health care provider about eating or drinking restrictions.   Avoiding future gout attacks  Follow a low-purine diet as told by your dietitian or health care provider. Avoid foods and drinks that are high in purines, including liver, kidney, anchovies, asparagus, herring, mushrooms, mussels, and beer.  Maintain a healthy weight or lose weight if you are overweight. If you want to lose weight, talk with your health care provider. It is important that you do not lose weight too quickly.  Start or maintain an exercise program as told by your health care provider. Eating and drinking  Drink enough fluids to keep your urine pale yellow.  If you drink alcohol: ? Limit how much you use to:  0-1 drink a day for women.  0-2 drinks a day for men. ? Be aware of how much alcohol is in your drink. In the [place] one drink equals one 12 oz bottle of beer (355 mL) one 5 oz glass of wine (148 mL), or one 1 oz glass of hard liquor (44 mL). General instructions  Take over-the-counter and prescription medicines only as told by your health care provider.  Do not drive or use heavy machinery while taking prescription pain medicine.  Return to your normal activities as told by your health care provider. Ask your health care provider what activities are safe for you.  Keep all follow-up visits as told by your health care provider. This is important. Contact a health care provider if you have:  Another gout attack.  Continuing symptoms of a gout attack after 10 days of treatment.  Side effects from your medicines.  Chills or a fever.  Burning pain when you urinate.  Pain in your lower back or belly. Get help right away if you:  Have severe or uncontrolled pain.  Cannot urinate. Summary  Gout is painful swelling of the joints caused by inflammation.  The most common site of pain is the big  toe, but it can affect other joints in the body.  Medicines and dietary changes can help to prevent and treat gout attacks. This information is not intended to replace advice given to you by your health care provider. Make sure you discuss any questions you have with your health care provider. Document Revised: 01/17/2018 Document Reviewed: 01/17/2018 Elsevier Patient Education  2021 [employer]  

## 2020-09-01 NOTE — Assessment & Plan Note (Addendum)
Left ankle. Minimize purines, hydrate and monitor, uric acid has trended up.

## 2020-09-01 NOTE — Assessment & Plan Note (Signed)
hgba1c acceptable, minimize simple carbs. Increase exercise as tolerated. Continue current meds 

## 2020-09-02 DIAGNOSIS — R600 Localized edema: Secondary | ICD-10-CM | POA: Insufficient documentation

## 2020-09-02 NOTE — Assessment & Plan Note (Signed)
Increase leafy greens, consider increased lean red meat and using cast iron cookware. Continue to monitor, report any concerns. Continue FeSO4

## 2020-09-02 NOTE — Progress Notes (Signed)
Subjective:    Patient ID: Evan Best, male    DOB: 1951/03/25, 70 y.o.   MRN: [id number]  Chief Complaint  Patient presents with  . Follow-up  . Diabetes    Swollen foot,ankle, and calf    HPI Patient is in today for follow up on chronic medical concerns. No recent febrile illness or hospitalizations. He denies any falls or trauma but he still has significant swelling in left lower leg as well as discomfort. He has notable pain in his left ankle. No complaints of polyruia or polydipsia. Denies CP/palp/SOB/HA/congestion/fevers/GI or GU c/o. Taking meds as prescribed  Past Medical History:  Diagnosis Date  . Allergic state 09/22/2013  . Anemia 09/07/2015  . Arthritis 03/06/2015  . Diabetes mellitus type II 1/09  . Dysphagia 05/04/2017  . Esophageal reflux 03/06/2015  . Gout 09/18/2012   Left big toe  . Hypertension   . Insomnia 09/13/2015  . Migraine headache    remote history  . OSA (obstructive sleep apnea)   . Preventative health care 09/05/2014  . Right shoulder pain 02/19/2009   Qualifier: Diagnosis of  By: Wynona Luna   . RLS (restless legs syndrome) 09/07/2015  . Sleep apnea    uses cpap  . Tremor 09/07/2015    Past Surgical History:  Procedure Laterality Date  . HERNIA REPAIR  5277   umbilical hernia repair  . KNEE SURGERY     left knee surgery  . TONSILLECTOMY  1959  . VASECTOMY      Family History  Problem Relation Age of Onset  . Breast cancer Mother   . Hypertension Mother   . Hyperlipidemia Father   . Kidney disease Father   . Heart disease Father   . Hypertension Father   . Colon cancer Neg Hx   . Stomach cancer Neg Hx     Social History   Socioeconomic History  . Marital status: Married    Spouse name: Not on file  . Number of children: Not on file  . Years of education: Not on file  . Highest education level: Not on file  Occupational History  . Not on file  Tobacco Use  . Smoking status: Never Smoker  . Smokeless tobacco:  Never Used  Substance and Sexual Activity  . Alcohol use: No  . Drug use: No  . Sexual activity: Yes  Other Topics Concern  . Not on file  Social History Narrative   Occupation:  Chemical engineer   Married  -  1 daughter (age 87)    Never Smoked   Alcohol use-no     Drug use-no          Social Determinants of Health   Financial Resource Strain: Not on file  Food Insecurity: Not on file  Transportation Needs: Not on file  Physical Activity: Not on file  Stress: Not on file  Social Connections: Not on file  Intimate Partner Violence: Not on file    Outpatient Medications Prior to Visit  Medication Sig Dispense Refill  . amLODipine (NORVASC) 10 MG tablet TAKE 1 TABLET BY MOUTH EVERY DAY 90 tablet 1  . Calcium-Magnesium-Zinc 1000-400-15 MG TABS Take by mouth daily.    . cetirizine (ZYRTEC) 10 MG tablet Take 10 mg by mouth daily.    . Chelated Potassium 99 MG TABS Take 99 mg by mouth 2 (two) times daily.    . Cholecalciferol (D3 ADULT PO) Take by mouth.    . Cinnamon 500  MG capsule Take 500 mg 3 (three) times daily by mouth. +50 mg Chromium in each supplement.    . Coenzyme Q10 (CO Q-10) 100 MG CAPS Take 200 mg by mouth daily.    . Cyanocobalamin (VITAMIN B 12 PO) Take 1 capsule daily by mouth.    . famotidine (PEPCID) 40 MG tablet TAKE 1 TABLET BY MOUTH EVERY DAY 90 tablet 1  . Glucosamine-Chondroitin-Vit D3 1500-1200-800 MG-MG-UNIT PACK Take by mouth.    Marland Kitchen glucose blood test strip One touch Ultra blue -Check sugars daily-  DX E11.69 100 each 11  . lisinopril (ZESTRIL) 40 MG tablet Take 1 tablet (40 mg total) by mouth daily. 90 tablet 1  . Melatonin 10 MG TABS Take by mouth.    . metFORMIN (GLUCOPHAGE) 500 MG tablet TAKE 2 TABLETS (1,000 MG TOTAL) BY MOUTH 2 (TWO) TIMES DAILY WITH A MEAL. 360 tablet 1  . Misc Natural Products (TART CHERRY ADVANCED PO) Take by mouth.    . Omega-3 Fatty Acids (FISH OIL PO) Take 1,500 mg by mouth daily.    . Quercetin 250 MG TABS Take by  mouth 2 (two) times daily.     . simvastatin (ZOCOR) 10 MG tablet Take 1 tablet (10 mg total) by mouth at bedtime. 90 tablet 1  . TURMERIC PO Take by mouth.    . vitamin C (ASCORBIC ACID) 500 MG tablet Take 500 mg by mouth 3 (three) times daily.    . ferrous sulfate 325 (65 FE) MG tablet Take 1 tablet (325 mg total) by mouth daily with breakfast. 90 tablet 3  . ONE TOUCH ULTRA TEST test strip USE TO CHECK BLOOD SUGAR DAILY AS NEEDED 100 each 5   No facility-administered medications prior to visit.    No Known Allergies  Review of Systems  Constitutional: Negative for fever and malaise/fatigue.  HENT: Negative for congestion.   Eyes: Negative for blurred vision.  Respiratory: Negative for shortness of breath.   Cardiovascular: Positive for leg swelling. Negative for chest pain and palpitations.  Gastrointestinal: Negative for abdominal pain, blood in stool and nausea.  Genitourinary: Negative for dysuria and frequency.  Musculoskeletal: Positive for joint pain and myalgias. Negative for falls.  Skin: Negative for rash.  Neurological: Negative for dizziness, loss of consciousness and headaches.  Endo/Heme/Allergies: Negative for environmental allergies.  Psychiatric/Behavioral: Negative for depression. The patient is not nervous/anxious.        Objective:    Physical Exam Vitals and nursing note reviewed.  Constitutional:      General: He is not in acute distress.    Appearance: He is well-developed and well-nourished.  HENT:     Head: Normocephalic and atraumatic.     Nose: Nose normal.  Eyes:     General:        Right eye: No discharge.        Left eye: No discharge.  Cardiovascular:     Rate and Rhythm: Normal rate and regular rhythm.     Heart sounds: No murmur heard.   Pulmonary:     Effort: Pulmonary effort is normal.     Breath sounds: Normal breath sounds.  Abdominal:     General: Bowel sounds are normal.     Palpations: Abdomen is soft.     Tenderness:  There is no abdominal tenderness.  Musculoskeletal:        General: Swelling present. No edema.     Cervical back: Normal range of motion and neck supple.  Comments: Left leg 2+ pedal edema, right leg without pedal edema  Skin:    General: Skin is warm and dry.  Neurological:     Mental Status: He is alert and oriented to person, place, and time.  Psychiatric:        Mood and Affect: Mood and affect normal.     There were no vitals taken for this visit. Wt Readings from Last 3 Encounters:  04/28/20 205 lb 12.8 oz (93.4 kg)  10/24/19 213 lb (96.6 kg)  10/03/19 213 lb (96.6 kg)    Diabetic Foot Exam - Simple   Simple Foot Form Visual Inspection Sensation Testing Intact to touch and monofilament testing bilaterally: Yes Pulse Check Posterior Tibialis and Dorsalis pulse intact bilaterally: Yes Comments Edema in left foot>right foot     Lab Results  Component Value Date   WBC 8.6 08/28/2020   HGB 14.7 08/28/2020   HCT 41.0 08/28/2020   PLT 306.0 08/28/2020   GLUCOSE 123 (H) 08/28/2020   CHOL 122 08/28/2020   TRIG 119.0 08/28/2020   HDL 32.10 (L) 08/28/2020   LDLDIRECT 74.0 08/03/2017   LDLCALC 66 08/28/2020   ALT 23 08/28/2020   AST 18 08/28/2020   NA 139 08/28/2020   K 3.7 08/28/2020   CL 102 08/28/2020   CREATININE 1.05 08/28/2020   BUN 13 08/28/2020   CO2 30 08/28/2020   TSH 4.43 08/28/2020   PSA 0.61 02/08/2018   HGBA1C 6.1 08/28/2020   MICROALBUR 1.66 03/15/2013    Lab Results  Component Value Date   TSH 4.43 08/28/2020   Lab Results  Component Value Date   WBC 8.6 08/28/2020   HGB 14.7 08/28/2020   HCT 41.0 08/28/2020   MCV 83.8 08/28/2020   PLT 306.0 08/28/2020   Lab Results  Component Value Date   NA 139 08/28/2020   K 3.7 08/28/2020   CO2 30 08/28/2020   GLUCOSE 123 (H) 08/28/2020   BUN 13 08/28/2020   CREATININE 1.05 08/28/2020   BILITOT 0.7 08/28/2020   ALKPHOS 52 08/28/2020   AST 18 08/28/2020   ALT 23 08/28/2020   PROT 6.6  08/28/2020   ALBUMIN 4.2 08/28/2020   CALCIUM 9.3 08/28/2020   GFR 72.83 08/28/2020   Lab Results  Component Value Date   CHOL 122 08/28/2020   Lab Results  Component Value Date   HDL 32.10 (L) 08/28/2020   Lab Results  Component Value Date   LDLCALC 66 08/28/2020   Lab Results  Component Value Date   TRIG 119.0 08/28/2020   Lab Results  Component Value Date   CHOLHDL 4 08/28/2020   Lab Results  Component Value Date   HGBA1C 6.1 08/28/2020       Assessment & Plan:   Problem List Items Addressed This Visit    Hyperlipidemia, mixed - Primary   Relevant Orders   Lipid panel   Essential hypertension    Well controlled, no changes to meds. Encouraged heart healthy diet such as the DASH diet and exercise as tolerated.       Relevant Orders   CBC   Comprehensive metabolic panel   TSH   Diabetes mellitus type 2 in obese (HCC)    hgba1c acceptable, minimize simple carbs. Increase exercise as tolerated. Continue current meds      Relevant Orders   Hemoglobin A1c   Gout    Left ankle. Minimize purines, hydrate and monitor, uric acid has trended up.       Relevant  Orders   Uric acid   Anemia    Increase leafy greens, consider increased lean red meat and using cast iron cookware. Continue to monitor, report any concerns. Continue FeSO4      Relevant Medications   ferrous sulfate 325 (65 FE) MG tablet   Insomnia    Encouraged good sleep hygiene such as dark, quiet room. No blue/green glowing lights such as computer screens in bedroom. No alcohol or stimulants in evening. Cut down on caffeine as able. Regular exercise is helpful but not just prior to bed time.       Pedal edema    Left leg significantly worse than right. Lower extremity US of left leg showed no DVT. He is referred to vascular surgeon for further consderation.       Relevant Orders   Ambulatory referral to Vascular Surgery    Other Visit Diagnoses    Chronic pain of left ankle        Relevant Orders   Ambulatory referral to Vascular Surgery   Low iron       Relevant Medications   ferrous sulfate 325 (65 FE) MG tablet      I have changed Evan Best's ferrous sulfate. I am also having him maintain his Calcium-Magnesium-Zinc, Chelated Potassium, Cinnamon, Omega-3 Fatty Acids (FISH OIL PO), vitamin C, Co Q-10, cetirizine, Quercetin, Cyanocobalamin (VITAMIN B 12 PO), Glucosamine-Chondroitin-Vit D3, TURMERIC PO, Misc Natural Products (TART CHERRY ADVANCED PO), ONE TOUCH ULTRA TEST, Cholecalciferol (D3 ADULT PO), Melatonin, glucose blood, famotidine, metFORMIN, amLODipine, lisinopril, and simvastatin.  Meds ordered this encounter  Medications  . ferrous sulfate 325 (65 FE) MG tablet    Sig: Take 1 tablet (325 mg total) by mouth every other day.    Dispense:  90 tablet    Refill:  3     Penni Homans, MD

## 2020-09-02 NOTE — Assessment & Plan Note (Signed)
Left leg significantly worse than right. Lower extremity US of left leg showed no DVT. He is referred to vascular surgeon for further consderation.

## 2020-09-02 NOTE — Assessment & Plan Note (Signed)
Encouraged good sleep hygiene such as dark, quiet room. No blue/green glowing lights such as computer screens in bedroom. No alcohol or stimulants in evening. Cut down on caffeine as able. Regular exercise is helpful but not just prior to bed time.  

## 2020-09-02 NOTE — Assessment & Plan Note (Signed)
Well controlled, no changes to meds. Encouraged heart healthy diet such as the DASH diet and exercise as tolerated.  °

## 2020-09-25 ENCOUNTER — Encounter: Payer: Self-pay | Admitting: Podiatry

## 2020-09-29 ENCOUNTER — Other Ambulatory Visit: Payer: Self-pay

## 2020-09-29 DIAGNOSIS — M7989 Other specified soft tissue disorders: Secondary | ICD-10-CM

## 2020-10-02 ENCOUNTER — Ambulatory Visit (HOSPITAL_COMMUNITY)
Admission: RE | Admit: 2020-10-02 | Discharge: 2020-10-02 | Disposition: A | Discharge status: Home / Self Care | Payer: Medicare Other | Source: Ambulatory Visit | Attending: Vascular Surgery | Admitting: Vascular Surgery

## 2020-10-02 ENCOUNTER — Other Ambulatory Visit: Payer: Self-pay

## 2020-10-02 ENCOUNTER — Ambulatory Visit (INDEPENDENT_AMBULATORY_CARE_PROVIDER_SITE_OTHER): Payer: Medicare Other | Admitting: Physician Assistant

## 2020-10-02 VITALS — BP 164/83 | HR 73 | Temp 98.3°F | Resp 20 | Ht 71.0 in | Wt 209.2 lb

## 2020-10-02 DIAGNOSIS — M7989 Other specified soft tissue disorders: Secondary | ICD-10-CM

## 2020-10-02 NOTE — Progress Notes (Signed)
HPI male never smoker for sleep evaluation.  Previously followed for OSA, restless legs, chronic rhinitis, complicated by HBP, DM 2, GERD, HBP, gout, NPSG 11/26/06-AHI 56/hour, desaturation to 86%, body weight 225 pounds  ----------------------------------------------------------------------------------------   10/03/19- 70 year old male never smoker followed for OSA, restless legs, chronic rhinitis, complicated by HBP, DM 2, GERD, gout, chronic headaches CPAP auto 5-15/Adapt Download compliance 97%, AHI 0.5/ hr Body weight today 213 lbs No Covax yet He is apparently quite comfortable to maintain social distancing and other covid precautions. Doing well with current CPAP settings.  Denies other medical issues of concern.   10/05/20- 70 year old male never smoker followed for OSA, restless legs, chronic rhinitis, complicated by HBP, DM 2, GERD, gout, chronic headaches, Hyperlipidemia,  CPAP auto 5-15/Adapt Download- compliance 93%, AHI 0.6/ hr Body weight today-210 lbs Covid vax-2 Phizer Flu vax-had Doing well with CPAP. Sleeps on sides preferentially.  Denies allergic rhinitis so far this Spring.   ROS-see HPI   + = positive Constitutional:   No-   weight loss, night sweats, fevers, chills, fatigue, lassitude. HEENT:   No-  headaches, difficulty swallowing, tooth/dental problems, sore throat,       No-  sneezing, itching, ear ache, nasal congestion, post nasal drip,  CV:  No-   chest pain, orthopnea, PND, swelling in lower extremities, anasarca, dizziness, palpitations Resp: No-   shortness of breath with exertion or at rest.              No-   productive cough,   non-productive cough,  No- coughing up of blood.              No-   change in color of mucus.  No- wheezing.   Skin: No-   rash or lesions. GI:  No-   heartburn, indigestion, abdominal pain, nausea, vomiting,  GU: . MS:  No-   joint pain or swelling.   Neuro-     nothing unusual Psych:  No- change in mood or affect. No  depression or anxiety.  No memory loss.  OBJ General- Alert, Oriented, Affect-appropriate, Distress- none acute, + overweight Skin- rash-none, lesions- none, excoriation- none Lymphadenopathy- none Head- atraumatic            Eyes- Gross vision intact, PERRLA, conjunctivae clear secretions            Ears- Hearing, canals-normal            Nose- Clear, +narrower on right, +mucus bridging, polyps, erosion, perforation             Throat- Mallampati III , mucosa  , drainage- none, tonsils- atrophic Neck- flexible , trachea midline, no stridor , thyroid nl, carotid no bruit Chest - symmetrical excursion , unlabored           Heart/CV- RRR/ +frequent extra beats , no murmur , no gallop  , no rub, nl s1 s2                           - JVD- none , edema- none, stasis changes- none, varices- none           Lung- clear to P&A, wheeze- none, cough- none , dullness-none, rub- none           Chest wall-  Abd-  Br/ Gen/ Rectal- Not done, not indicated Extrem- cyanosis- none, clubbing, none, atrophy- none, strength- nl Neuro- grossly intact to observation

## 2020-10-02 NOTE — Progress Notes (Signed)
[facility]           OF [place]  History and Physical   Evan Best is a 70 y.o. male who presents with LLE swelling.   He states he started having leg swelling about 4 months ago in the left leg.  He states that it had gotten a little tight in the top part of his calf and he was scheduled for ultrasound.  It got delayed and it did not reveal any evidence of blood clots.  He states by the time he got the u/s, the tightness had somewhat improved.  He states he does not have a hx of DVT that he is aware of.  He does not have varicose veins that he is aware of.  He does not have any cardiac or kidney issues.  His paternal grandfather may have had varicose veins.  He has not used any compression in the past but did try to use an ace wrap but did not get any relief.  He does a lot of sitting at the computer.    He states that at one point, he did elevate his legs on a couple of pillows instead of two and the swelling did get a little bit better but was short lived.  He does have some diabetic neuropathy.    The pt is on a statin for cholesterol management.  The pt is not on a daily aspirin.   Other AC:  none The pt is on CCB, ACEI for hypertension.   The pt is diabetic.   Tobacco hx:  former   Past Medical History:  Diagnosis Date  . Allergic state 09/22/2013  . Anemia 09/07/2015  . Arthritis 03/06/2015  . Diabetes mellitus type II 1/09  . Dysphagia 05/04/2017  . Esophageal reflux 03/06/2015  . Gout 09/18/2012   Left big toe  . Hypertension   . Insomnia 09/13/2015  . Migraine headache    remote history  . OSA (obstructive sleep apnea)   . Preventative health care 09/05/2014  . Right shoulder pain 02/19/2009   Qualifier: Diagnosis of  By: Wynona Luna   . RLS (restless legs syndrome) 09/07/2015  . Sleep apnea    uses cpap  . Tremor 09/07/2015    Past Surgical History:  Procedure Laterality Date  . HERNIA REPAIR  2671   umbilical hernia repair  .  KNEE SURGERY     left knee surgery  . TONSILLECTOMY  1959  . VASECTOMY      Social History   Socioeconomic History  . Marital status: Married    Spouse name: Not on file  . Number of children: 1  . Years of education: Not on file  . Highest education level: Not on file  Occupational History  . Not on file  Tobacco Use  . Smoking status: Never Smoker  . Smokeless tobacco: Never Used  Substance and Sexual Activity  . Alcohol use: No  . Drug use: No  . Sexual activity: Yes  Other Topics Concern  . Not on file  Social History Narrative   Occupation:  Chemical engineer   Married  -  1 daughter (age 53)    Never Smoked   Alcohol use-no     Drug use-no          Social Determinants of Health   Financial Resource Strain: Not on file  Food Insecurity: Not on file  Transportation Needs: Not  on file  Physical Activity: Not on file  Stress: Not on file  Social Connections: Not on file  Intimate Partner Violence: Not on file     Family History  Problem Relation Age of Onset  . Breast cancer Mother   . Hypertension Mother   . Hyperlipidemia Father   . Kidney disease Father   . Heart disease Father   . Hypertension Father   . Colon cancer Neg Hx   . Stomach cancer Neg Hx     Current Outpatient Medications  Medication Sig Dispense Refill  . amLODipine (NORVASC) 10 MG tablet TAKE 1 TABLET BY MOUTH EVERY DAY 90 tablet 1  . Calcium-Magnesium-Zinc 1000-400-15 MG TABS Take by mouth daily.    . cetirizine (ZYRTEC) 10 MG tablet Take 10 mg by mouth daily.    . Chelated Potassium 99 MG TABS Take 99 mg by mouth 2 (two) times daily.    . Cholecalciferol (D3 ADULT PO) Take by mouth.    . Cinnamon 500 MG capsule Take 500 mg 3 (three) times daily by mouth. +50 mg Chromium in each supplement.    . Coenzyme Q10 (CO Q-10) 100 MG CAPS Take 200 mg by mouth daily.    . Cyanocobalamin (VITAMIN B 12 PO) Take 1 capsule daily by mouth.    . famotidine (PEPCID) 40 MG tablet TAKE 1 TABLET  BY MOUTH EVERY DAY 90 tablet 1  . ferrous sulfate 325 (65 FE) MG tablet Take 1 tablet (325 mg total) by mouth every other day. 90 tablet 3  . Glucosamine-Chondroitin-Vit D3 1500-1200-800 MG-MG-UNIT PACK Take by mouth.    Marland Kitchen glucose blood test strip One touch Ultra blue -Check sugars daily-  DX E11.69 100 each 11  . lisinopril (ZESTRIL) 40 MG tablet Take 1 tablet (40 mg total) by mouth daily. 90 tablet 1  . Melatonin 10 MG TABS Take by mouth.    . metFORMIN (GLUCOPHAGE) 500 MG tablet TAKE 2 TABLETS (1,000 MG TOTAL) BY MOUTH 2 (TWO) TIMES DAILY WITH A MEAL. 360 tablet 1  . Misc Natural Products (TART CHERRY ADVANCED PO) Take by mouth.    . Omega-3 Fatty Acids (FISH OIL PO) Take 1,500 mg by mouth daily.    . Quercetin 250 MG TABS Take by mouth 2 (two) times daily.     . simvastatin (ZOCOR) 10 MG tablet Take 1 tablet (10 mg total) by mouth at bedtime. 90 tablet 1  . TURMERIC PO Take by mouth.    . vitamin C (ASCORBIC ACID) 500 MG tablet Take 500 mg by mouth 3 (three) times daily.     No current facility-administered medications for this visit.    No Known Allergies  REVIEW OF SYSTEMS:   [X]  denotes positive finding, [ ]  denotes negative finding Cardiac  Comments:  Chest pain or chest pressure:    Shortness of breath upon exertion:    Short of breath when lying flat:    Irregular heart rhythm:        Vascular    Pain in calf, thigh, or hip brought on by ambulation:    Pain in feet at night that wakes you up from your sleep:     Blood clot in your veins:    Leg swelling:  x       Pulmonary    Oxygen at home:    Productive cough:     Wheezing:         Neurologic    Sudden weakness in arms or  legs:     Sudden numbness in arms or legs:     Sudden onset of difficulty speaking or slurred speech:    Temporary loss of vision in one eye:     Problems with dizziness:         Gastrointestinal    Blood in stool:     Vomited blood:         Genitourinary    Burning when urinating:      Blood in urine:        Psychiatric    Major depression:         Hematologic    Bleeding problems:    Problems with blood clotting too easily:        Skin    Rashes or ulcers:        Constitutional    Fever or chills:      PHYSICAL EXAMINATION:  Today's Vitals   10/02/20 1039  BP: (!) 164/83  Pulse: 73  Resp: 20  Temp: 98.3 F (36.8 C)  TempSrc: Temporal  SpO2: 96%  Weight: 209 lb 3.2 oz (94.9 kg)  Height: 5\' 11"  (1.803 m)  PainSc: 3   PainLoc: Leg   Body mass index is 29.18 kg/m.   General:  WDWN in NAD; vital signs documented above Gait: Not observed HENT: WNL, normocephalic Pulmonary: normal non-labored breathing without wheezing Cardiac: regular HR; without carotid bruits Abdomen: soft, NT, no masses; aortic pulse is not palpable Skin: without rashes Vascular Exam/Pulses:  Right Left  Radial 2+ (normal) 2+ (normal)  DP 2+ (normal) 2+ (normal)  PT Unable to palpate Unable to palpate   Extremities: left lower extremity edema      The right leg also has 1+ pitting edema as does the left leg Musculoskeletal: no muscle wasting or atrophy  Neurologic: A&O X 3;  moving all extremities equally Psychiatric:  The pt has Normal affect.   Non-Invasive Vascular Imaging:   Venous duplex on 10/02/2020: +--------------+---------+------+-----------+------------+--------+  LEFT     Reflux NoRefluxReflux TimeDiameter cmsComments               Yes                   +--------------+---------+------+-----------+------------+--------+  CFV            yes  >1 second             +--------------+---------+------+-----------+------------+--------+  FV mid    no                         +--------------+---------+------+-----------+------------+--------+  Popliteal   no                          +--------------+---------+------+-----------+------------+--------+  GSV at SFJ        yes  >500 ms   0.72        +--------------+---------+------+-----------+------------+--------+  GSV prox thigh      yes  >500 ms   0.32        +--------------+---------+------+-----------+------------+--------+  GSV mid thigh no               0.32        +--------------+---------+------+-----------+------------+--------+  GSV dist thighno               0.34        +--------------+---------+------+-----------+------------+--------+  GSV at knee  no  0.33        +--------------+---------+------+-----------+------------+--------+  GSV prox calf       yes  >500 ms   0.32        +--------------+---------+------+-----------+------------+--------+  SSV Pop Fossa no               0.28        +--------------+---------+------+-----------+------------+--------+  SSV prox calf no               0.30        +--------------+---------+------+-----------+------------+--------+  SSV mid calf no               0.32        +--------------+---------+------+-----------+------------+--------+   Summary:  Left:  - No evidence of deep vein thrombosis seen in the left lower extremity,  from the common femoral through the popliteal veins.  - No evidence of superficial venous thrombosis in the left lower  extremity.    - Venous reflux is noted in the left common femoral vein.  - Venous reflux is noted in the left sapheno-femoral junction.  - Venous reflux is noted in the left greater saphenous vein in the thigh.  - Venous reflux is noted in the left greater saphenous vein in the calf.    Evan Best is a 70 y.o. male who presents with:bilateral  leg swelling with left > right  Pt  with palpable bilateral DP pulses.  His duplex today reveals there is no evidence of DVT.  He does hve deep venous reflux as well as superficial venous reflux, however, the diameter of the vein does not meet criteria for laser ablation.   -given it is the left leg, he could have a May Thurner syndrome.  We discussed proceeding currently with conservative therapy but if this worsens, we can proceed with venogram in the PV lab.  He is in agreement with this plan.  -discussed with pt about wearing knee high 15-52mmHg compression stockings.  Discussed with him to put them on in the morning before getting out of bed and taking them off prior to going to bed.  He was measured today for compression.  -discussed the importance of leg elevation and how to elevate properly - pt is advised to elevate their legs and a diagram is given to them to demonstrate to lay flat on their back with knees elevated and slightly bent with their feet higher than her knees, which puts their feet higher than their heart for 15 minutes per day.  If they cannot lay flat, advised to lay as flat as possible.  -pt is advised to continue as much walking as possible and avoid sitting or standing for long periods of time.  -discussed importance of weight loss and exercise and that water aerobics would also be beneficial.  -handout with recommendations given -pt will f/u as needed.  He knows to call if he has any other issues.    Leontine Locket, [facility] 10/02/2020 10:45 AM  Clinic MD:  Donzetta Matters

## 2020-10-05 ENCOUNTER — Encounter: Payer: Self-pay | Admitting: Internal Medicine

## 2020-10-05 ENCOUNTER — Other Ambulatory Visit: Payer: Self-pay

## 2020-10-05 ENCOUNTER — Ambulatory Visit (INDEPENDENT_AMBULATORY_CARE_PROVIDER_SITE_OTHER): Payer: Medicare Other | Admitting: Internal Medicine

## 2020-10-05 DIAGNOSIS — G4733 Obstructive sleep apnea (adult) (pediatric): Secondary | ICD-10-CM

## 2020-10-05 DIAGNOSIS — J310 Chronic rhinitis: Secondary | ICD-10-CM

## 2020-10-05 NOTE — Assessment & Plan Note (Signed)
Benefits with good compliance and control Plan- continue CPAP auto 5-15

## 2020-10-05 NOTE — Patient Instructions (Signed)
We can continue CPAP auto 5-15 ° °Please call if we can help °

## 2020-10-05 NOTE — Assessment & Plan Note (Signed)
Persistent stuffiness with some seasonal exacerbation.  Plan- educated very cautious use of decongestants. Potential referral to ENT

## 2020-10-16 ENCOUNTER — Other Ambulatory Visit: Payer: Self-pay | Admitting: Family Medicine

## 2020-10-21 DIAGNOSIS — H2513 Age-related nuclear cataract, bilateral: Secondary | ICD-10-CM | POA: Diagnosis not present

## 2020-10-21 DIAGNOSIS — E119 Type 2 diabetes mellitus without complications: Secondary | ICD-10-CM | POA: Diagnosis not present

## 2020-10-21 DIAGNOSIS — H353131 Nonexudative age-related macular degeneration, bilateral, early dry stage: Secondary | ICD-10-CM | POA: Diagnosis not present

## 2020-10-21 DIAGNOSIS — H04123 Dry eye syndrome of bilateral lacrimal glands: Secondary | ICD-10-CM | POA: Diagnosis not present

## 2020-10-21 LAB — HM DIABETES EYE EXAM

## 2020-11-26 ENCOUNTER — Other Ambulatory Visit (INDEPENDENT_AMBULATORY_CARE_PROVIDER_SITE_OTHER): Payer: Medicare Other

## 2020-11-26 DIAGNOSIS — E1169 Type 2 diabetes mellitus with other specified complication: Secondary | ICD-10-CM

## 2020-11-26 DIAGNOSIS — E669 Obesity, unspecified: Secondary | ICD-10-CM | POA: Diagnosis not present

## 2020-11-26 DIAGNOSIS — I10 Essential (primary) hypertension: Secondary | ICD-10-CM

## 2020-11-26 DIAGNOSIS — M109 Gout, unspecified: Secondary | ICD-10-CM | POA: Diagnosis not present

## 2020-11-26 DIAGNOSIS — E782 Mixed hyperlipidemia: Secondary | ICD-10-CM | POA: Diagnosis not present

## 2020-11-26 LAB — CBC
HCT: 44 % (ref 39.0–52.0)
Hemoglobin: 15.7 g/dL (ref 13.0–17.0)
MCHC: 35.6 g/dL (ref 30.0–36.0)
MCV: 87.3 fl (ref 78.0–100.0)
Platelets: 317 10*3/uL (ref 150.0–400.0)
RBC: 5.05 Mil/uL (ref 4.22–5.81)
RDW: 13.2 % (ref 11.5–15.5)
WBC: 8.9 10*3/uL (ref 4.0–10.5)

## 2020-11-26 LAB — COMPREHENSIVE METABOLIC PANEL
ALT: 28 U/L (ref 0–53)
AST: 19 U/L (ref 0–37)
Albumin: 4.3 g/dL (ref 3.5–5.2)
Alkaline Phosphatase: 54 U/L (ref 39–117)
BUN: 15 mg/dL (ref 6–23)
CO2: 31 mEq/L (ref 19–32)
Calcium: 9.3 mg/dL (ref 8.4–10.5)
Chloride: 102 mEq/L (ref 96–112)
Creatinine, Ser: 1.18 mg/dL (ref 0.40–1.50)
GFR: 63.2 mL/min (ref >60.00)
Glucose, Bld: 134 mg/dL — ABNORMAL HIGH (ref 70–99)
Potassium: 4 mEq/L (ref 3.5–5.1)
Sodium: 140 mEq/L (ref 135–145)
Total Bilirubin: 0.8 mg/dL (ref 0.2–1.2)
Total Protein: 7.1 g/dL (ref 6.0–8.3)

## 2020-11-26 LAB — LIPID PANEL
Cholesterol: 123 mg/dL (ref 0–200)
HDL: 30.8 mg/dL — ABNORMAL LOW (ref >39.00)
LDL Cholesterol: 69 mg/dL (ref 0–99)
NonHDL: 92.36
Total CHOL/HDL Ratio: 4
Triglycerides: 115 mg/dL (ref 0.0–149.0)
VLDL: 23 mg/dL (ref 0.0–40.0)

## 2020-11-26 LAB — TSH: TSH: 3.35 u[IU]/mL (ref 0.35–4.50)

## 2020-11-26 LAB — HEMOGLOBIN A1C: Hgb A1c MFr Bld: 5.9 % (ref 4.6–6.5)

## 2020-11-26 LAB — URIC ACID: Uric Acid, Serum: 7.6 mg/dL (ref 4.0–7.8)

## 2020-11-30 ENCOUNTER — Other Ambulatory Visit: Payer: Self-pay

## 2020-11-30 ENCOUNTER — Encounter: Payer: Self-pay | Admitting: Family Medicine

## 2020-11-30 ENCOUNTER — Ambulatory Visit (INDEPENDENT_AMBULATORY_CARE_PROVIDER_SITE_OTHER): Payer: Medicare Other | Admitting: Family Medicine

## 2020-11-30 DIAGNOSIS — E669 Obesity, unspecified: Secondary | ICD-10-CM | POA: Diagnosis not present

## 2020-11-30 DIAGNOSIS — E782 Mixed hyperlipidemia: Secondary | ICD-10-CM | POA: Diagnosis not present

## 2020-11-30 DIAGNOSIS — I10 Essential (primary) hypertension: Secondary | ICD-10-CM | POA: Diagnosis not present

## 2020-11-30 DIAGNOSIS — M1A9XX0 Chronic gout, unspecified, without tophus (tophi): Secondary | ICD-10-CM

## 2020-11-30 DIAGNOSIS — M1990 Unspecified osteoarthritis, unspecified site: Secondary | ICD-10-CM

## 2020-11-30 DIAGNOSIS — E1169 Type 2 diabetes mellitus with other specified complication: Secondary | ICD-10-CM

## 2020-11-30 DIAGNOSIS — R600 Localized edema: Secondary | ICD-10-CM

## 2020-11-30 NOTE — Patient Instructions (Signed)

## 2020-11-30 NOTE — Assessment & Plan Note (Signed)
hgba1c acceptable, minimize simple carbs. Increase exercise as tolerated.  

## 2020-11-30 NOTE — Assessment & Plan Note (Signed)
Bilateral knees limit his exercise ability. He declines aquatic therapy.

## 2020-11-30 NOTE — Assessment & Plan Note (Signed)
Encouraged heart healthy diet, increase exercise, avoid trans fats, consider a krill oil cap daily 

## 2020-11-30 NOTE — Progress Notes (Signed)
Subjective:    Patient ID: Evan Best, male    DOB: 1951/01/18, 70 y.o.   MRN: [id number]  Chief Complaint  Patient presents with  . Follow-up    DM and hyperlipidemia    HPI Patient is in today for Patient is in today for follow up on chronic medical concerns.including hypertension, diabetes and more. He feels well today. No recent febrile illness or hospitalizations. He continues to try and eat well and stay active. Denies CP/palp/SOB/HA/congestion/fevers/GI or GU c/o. Taking meds as prescribed. He continues to struggle with knee pain bilaterally which limit his ability to exercise.   Past Medical History:  Diagnosis Date  . Allergic state 09/22/2013  . Anemia 09/07/2015  . Arthritis 03/06/2015  . Diabetes mellitus type II 1/09  . Dysphagia 05/04/2017  . Esophageal reflux 03/06/2015  . Gout 09/18/2012   Left big toe  . Hypertension   . Insomnia 09/13/2015  . Migraine headache    remote history  . OSA (obstructive sleep apnea)   . Preventative health care 09/05/2014  . Right shoulder pain 02/19/2009   Qualifier: Diagnosis of  By: Wynona Luna   . RLS (restless legs syndrome) 09/07/2015  . Sleep apnea    uses cpap  . Tremor 09/07/2015    Past Surgical History:  Procedure Laterality Date  . HERNIA REPAIR  6440   umbilical hernia repair  . KNEE SURGERY     left knee surgery  . TONSILLECTOMY  1959  . VASECTOMY      Family History  Problem Relation Age of Onset  . Breast cancer Mother   . Hypertension Mother   . Hyperlipidemia Father   . Kidney disease Father   . Heart disease Father   . Hypertension Father   . Colon cancer Neg Hx   . Stomach cancer Neg Hx     Social History   Socioeconomic History  . Marital status: Married    Spouse name: Not on file  . Number of children: 1  . Years of education: Not on file  . Highest education level: Not on file  Occupational History  . Not on file  Tobacco Use  . Smoking status: Former Smoker    Years: 5.00     Types: Cigarettes    Quit date: 2005    Years since quitting: 17.4  . Smokeless tobacco: Never Used  . Tobacco comment: 1 pack per year  Substance and Sexual Activity  . Alcohol use: No  . Drug use: No  . Sexual activity: Yes  Other Topics Concern  . Not on file  Social History Narrative   Occupation:  Chemical engineer   Married  -  1 daughter (age 49)    Never Smoked   Alcohol use-no     Drug use-no          Social Determinants of Health   Financial Resource Strain: Not on file  Food Insecurity: Not on file  Transportation Needs: Not on file  Physical Activity: Not on file  Stress: Not on file  Social Connections: Not on file  Intimate Partner Violence: Not on file    Outpatient Medications Prior to Visit  Medication Sig Dispense Refill  . amLODipine (NORVASC) 10 MG tablet TAKE 1 TABLET BY MOUTH EVERY DAY 90 tablet 1  . Calcium-Magnesium-Zinc 1000-400-15 MG TABS Take by mouth daily.    . cetirizine (ZYRTEC) 10 MG tablet Take 10 mg by mouth daily.    . Chelated Potassium  99 MG TABS Take 99 mg by mouth 2 (two) times daily.    . Cholecalciferol (D3 ADULT PO) Take by mouth.    . Cinnamon 500 MG capsule Take 500 mg 3 (three) times daily by mouth. +50 mg Chromium in each supplement.    . Coenzyme Q10 (CO Q-10) 100 MG CAPS Take 200 mg by mouth daily.    . Cyanocobalamin (VITAMIN B 12 PO) Take 1 capsule daily by mouth.    . famotidine (PEPCID) 40 MG tablet TAKE 1 TABLET BY MOUTH EVERY DAY 90 tablet 1  . ferrous sulfate 325 (65 FE) MG tablet Take 1 tablet (325 mg total) by mouth every other day. 90 tablet 3  . Glucosamine-Chondroitin-Vit D3 1500-1200-800 MG-MG-UNIT PACK Take by mouth.    Marland Kitchen glucose blood test strip One touch Ultra blue -Check sugars daily-  DX E11.69 100 each 11  . lisinopril (ZESTRIL) 40 MG tablet Take 1 tablet (40 mg total) by mouth daily. 90 tablet 1  . Melatonin 10 MG TABS Take by mouth.    . metFORMIN (GLUCOPHAGE) 500 MG tablet TAKE 2 TABLETS (1,000  MG TOTAL) BY MOUTH 2 (TWO) TIMES DAILY WITH A MEAL. 360 tablet 1  . Misc Natural Products (TART CHERRY ADVANCED PO) Take by mouth.    . Omega-3 Fatty Acids (FISH OIL PO) Take 1,500 mg by mouth daily.    . Quercetin 250 MG TABS Take by mouth 2 (two) times daily.     . simvastatin (ZOCOR) 10 MG tablet Take 1 tablet (10 mg total) by mouth at bedtime. 90 tablet 1  . TURMERIC PO Take by mouth.    . vitamin C (ASCORBIC ACID) 500 MG tablet Take 500 mg by mouth 3 (three) times daily.     No facility-administered medications prior to visit.    No Known Allergies  Review of Systems  Constitutional: Negative for fever and malaise/fatigue.  HENT: Negative for congestion.   Eyes: Negative for blurred vision.  Respiratory: Negative for shortness of breath.   Cardiovascular: Negative for chest pain, palpitations and leg swelling.  Gastrointestinal: Negative for abdominal pain, blood in stool and nausea.  Genitourinary: Negative for dysuria and frequency.  Musculoskeletal: Negative for falls.  Skin: Negative for rash.  Neurological: Negative for dizziness, loss of consciousness and headaches.  Endo/Heme/Allergies: Negative for environmental allergies.  Psychiatric/Behavioral: Negative for depression. The patient is not nervous/anxious.        Objective:    Physical Exam Vitals and nursing note reviewed.  Constitutional:      General: He is not in acute distress.    Appearance: He is well-developed.  HENT:     Head: Normocephalic and atraumatic.     Nose: Nose normal.  Eyes:     General:        Right eye: No discharge.        Left eye: No discharge.  Cardiovascular:     Rate and Rhythm: Normal rate and regular rhythm.     Heart sounds: No murmur heard.   Pulmonary:     Effort: Pulmonary effort is normal.     Breath sounds: Normal breath sounds.  Abdominal:     General: Bowel sounds are normal.     Palpations: Abdomen is soft.     Tenderness: There is no abdominal tenderness.   Musculoskeletal:     Cervical back: Normal range of motion and neck supple.  Skin:    General: Skin is warm and dry.  Neurological:  Mental Status: He is alert and oriented to person, place, and time.     BP 110/72   Pulse 83   Temp 97.8 F (36.6 C)   Resp 16   Wt 212 lb 9.6 oz (96.4 kg)   SpO2 99%   BMI 29.65 kg/m  Wt Readings from Last 3 Encounters:  11/30/20 212 lb 9.6 oz (96.4 kg)  10/05/20 210 lb 12.8 oz (95.6 kg)  10/02/20 209 lb 3.2 oz (94.9 kg)    Diabetic Foot Exam - Simple   No data filed    Lab Results  Component Value Date   WBC 8.9 11/26/2020   HGB 15.7 11/26/2020   HCT 44.0 11/26/2020   PLT 317.0 11/26/2020   GLUCOSE 134 (H) 11/26/2020   CHOL 123 11/26/2020   TRIG 115.0 11/26/2020   HDL 30.80 (L) 11/26/2020   LDLDIRECT 74.0 08/03/2017   LDLCALC 69 11/26/2020   ALT 28 11/26/2020   AST 19 11/26/2020   NA 140 11/26/2020   K 4.0 11/26/2020   CL 102 11/26/2020   CREATININE 1.18 11/26/2020   BUN 15 11/26/2020   CO2 31 11/26/2020   TSH 3.35 11/26/2020   PSA 0.61 02/08/2018   HGBA1C 5.9 11/26/2020   MICROALBUR 1.66 03/15/2013    Lab Results  Component Value Date   TSH 3.35 11/26/2020   Lab Results  Component Value Date   WBC 8.9 11/26/2020   HGB 15.7 11/26/2020   HCT 44.0 11/26/2020   MCV 87.3 11/26/2020   PLT 317.0 11/26/2020   Lab Results  Component Value Date   NA 140 11/26/2020   K 4.0 11/26/2020   CO2 31 11/26/2020   GLUCOSE 134 (H) 11/26/2020   BUN 15 11/26/2020   CREATININE 1.18 11/26/2020   BILITOT 0.8 11/26/2020   ALKPHOS 54 11/26/2020   AST 19 11/26/2020   ALT 28 11/26/2020   PROT 7.1 11/26/2020   ALBUMIN 4.3 11/26/2020   CALCIUM 9.3 11/26/2020   GFR 63.20 11/26/2020   Lab Results  Component Value Date   CHOL 123 11/26/2020   Lab Results  Component Value Date   HDL 30.80 (L) 11/26/2020   Lab Results  Component Value Date   LDLCALC 69 11/26/2020   Lab Results  Component Value Date   TRIG 115.0  11/26/2020   Lab Results  Component Value Date   CHOLHDL 4 11/26/2020   Lab Results  Component Value Date   HGBA1C 5.9 11/26/2020       Assessment & Plan:   Problem List Items Addressed This Visit    Hyperlipidemia, mixed    Encouraged heart healthy diet, increase exercise, avoid trans fats, consider a krill oil cap daily      Essential hypertension    Well controlled, no changes to meds. Encouraged heart healthy diet such as the DASH diet and exercise as tolerated.       Diabetes mellitus type 2 in obese (HCC)    hgba1c acceptable, minimize simple carbs. Increase exercise as tolerated      Gout    Hydrate and monitor         I am having Evan Best maintain his Calcium-Magnesium-Zinc, Chelated Potassium, Cinnamon, Omega-3 Fatty Acids (FISH OIL PO), vitamin C, Co Q-10, cetirizine, Quercetin, Cyanocobalamin (VITAMIN B 12 PO), Glucosamine-Chondroitin-Vit D3, TURMERIC PO, Misc Natural Products (TART CHERRY ADVANCED PO), Cholecalciferol (D3 ADULT PO), Melatonin, glucose blood, metFORMIN, amLODipine, lisinopril, simvastatin, ferrous sulfate, and famotidine.  No orders of the defined types were placed  in this encounter.    Penni Homans, MD

## 2020-11-30 NOTE — Assessment & Plan Note (Signed)
Hydrate and monitor 

## 2020-11-30 NOTE — Assessment & Plan Note (Signed)
Well controlled, no changes to meds. Encouraged heart healthy diet such as the DASH diet and exercise as tolerated.  °

## 2020-11-30 NOTE — Assessment & Plan Note (Signed)
Improved with compression hose from vascular surgery, he has no plans to return unless the swelling worsens again.

## 2020-12-01 ENCOUNTER — Ambulatory Visit: Payer: Medicare Other | Admitting: Podiatry

## 2020-12-09 ENCOUNTER — Other Ambulatory Visit: Payer: Self-pay | Admitting: Family Medicine

## 2020-12-14 ENCOUNTER — Encounter: Payer: Self-pay | Admitting: Podiatry

## 2020-12-14 ENCOUNTER — Other Ambulatory Visit: Payer: Self-pay

## 2020-12-14 ENCOUNTER — Ambulatory Visit (INDEPENDENT_AMBULATORY_CARE_PROVIDER_SITE_OTHER): Payer: Medicare Other | Admitting: Podiatry

## 2020-12-14 DIAGNOSIS — M79674 Pain in right toe(s): Secondary | ICD-10-CM | POA: Diagnosis not present

## 2020-12-14 DIAGNOSIS — M79675 Pain in left toe(s): Secondary | ICD-10-CM | POA: Diagnosis not present

## 2020-12-14 DIAGNOSIS — L84 Corns and callosities: Secondary | ICD-10-CM

## 2020-12-14 DIAGNOSIS — B351 Tinea unguium: Secondary | ICD-10-CM

## 2020-12-14 DIAGNOSIS — E119 Type 2 diabetes mellitus without complications: Secondary | ICD-10-CM

## 2020-12-18 NOTE — Progress Notes (Signed)
  Subjective:  Patient ID: Evan Best, male    DOB: 10/17/1950,  MRN: [id number]  Evan Best presents to clinic today for preventative diabetic foot care and painful thick toenails that are difficult to trim. Pain interferes with ambulation. Aggravating factors include wearing enclosed shoe gear. Pain is relieved with periodic professional debridement.  Patient states he doesn't monitor his blood glucose daily. His last A1c was 5.9%.  He notes no new pedal concerns on today's visit.  No Known Allergies  Review of Systems: Negative except as noted in the HPI. Objective:   Constitutional Evan Best is a pleasant 70 y.o. Caucasian male, WD, WN in NAD. AAO x 3.   Vascular Neurovascular status unchanged b/l lower extremities. Capillary refill time to digits immediate b/l. Palpable pedal pulses b/l LE. Pedal hair sparse. Lower extremity skin temperature gradient within normal limits. No cyanosis or clubbing noted.  Neurologic Normal speech. Oriented to person, place, and time. Protective sensation intact 5/5 intact bilaterally with 10g monofilament b/l. Vibratory sensation intact b/l.  Dermatologic Pedal skin with normal turgor, texture and tone bilaterally. No open wounds bilaterally. No interdigital macerations bilaterally. Toenails 1-5 b/l elongated, discolored, dystrophic, thickened, crumbly with subungual debris and tenderness to dorsal palpation. Hyperkeratotic lesion(s) R hallux.  No erythema, no edema, no drainage, no fluctuance.  Orthopedic: Normal muscle strength 5/5 to all lower extremity muscle groups bilaterally. No pain crepitus or joint limitation noted with ROM b/l. No gross bony deformities bilaterally. Patient ambulates independent of any assistive aids.   Radiographs: None Assessment:   1. Pain due to onychomycosis of toenails of both feet   2. Callus   3. Controlled type 2 diabetes mellitus without complication, without long-term current use of insulin ([place])     Plan:  Patient was evaluated and treated and all questions answered.  Onychomycosis with pain -Nails palliatively debridement as below -Educated on self-care  Procedure: Nail Debridement Rationale: Pain Type of Debridement: manual, sharp debridement. Instrumentation: Nail nipper, rotary burr. Number of Nails: 10 -Examined patient. -Continue diabetic foot care principles. -Patient to continue soft, supportive shoe gear daily. -Toenails 1-5 b/l were debrided in length and girth with sterile nail nippers and dremel without iatrogenic bleeding.  -Callus(es) R hallux pared utilizing sterile scalpel blade without complication or incident. Total number debrided =1. -Patient to report any pedal injuries to medical professional immediately. -Patient/POA to call should there be question/concern in the interim.  Return in about 3 months (around 03/16/2021).  Marzetta Board, DPM

## 2021-01-14 ENCOUNTER — Other Ambulatory Visit: Payer: Self-pay | Admitting: Family Medicine

## 2021-03-19 ENCOUNTER — Other Ambulatory Visit (INDEPENDENT_AMBULATORY_CARE_PROVIDER_SITE_OTHER): Payer: Medicare Other

## 2021-03-19 DIAGNOSIS — E782 Mixed hyperlipidemia: Secondary | ICD-10-CM | POA: Diagnosis not present

## 2021-03-19 DIAGNOSIS — E669 Obesity, unspecified: Secondary | ICD-10-CM | POA: Diagnosis not present

## 2021-03-19 DIAGNOSIS — I10 Essential (primary) hypertension: Secondary | ICD-10-CM

## 2021-03-19 DIAGNOSIS — E1169 Type 2 diabetes mellitus with other specified complication: Secondary | ICD-10-CM | POA: Diagnosis not present

## 2021-03-19 LAB — COMPREHENSIVE METABOLIC PANEL
ALT: 22 U/L (ref 0–53)
AST: 17 U/L (ref 0–37)
Albumin: 4.1 g/dL (ref 3.5–5.2)
Alkaline Phosphatase: 45 U/L (ref 39–117)
BUN: 15 mg/dL (ref 6–23)
CO2: 28 mEq/L (ref 19–32)
Calcium: 8.9 mg/dL (ref 8.4–10.5)
Chloride: 103 mEq/L (ref 96–112)
Creatinine, Ser: 1.01 mg/dL (ref 0.40–1.50)
GFR: 76 mL/min (ref >60.00)
Glucose, Bld: 123 mg/dL — ABNORMAL HIGH (ref 70–99)
Potassium: 3.7 mEq/L (ref 3.5–5.1)
Sodium: 139 mEq/L (ref 135–145)
Total Bilirubin: 0.7 mg/dL (ref 0.2–1.2)
Total Protein: 6.7 g/dL (ref 6.0–8.3)

## 2021-03-19 LAB — CBC
HCT: 41.2 % (ref 39.0–52.0)
Hemoglobin: 14.8 g/dL (ref 13.0–17.0)
MCHC: 35.8 g/dL (ref 30.0–36.0)
MCV: 86.8 fl (ref 78.0–100.0)
Platelets: 294 10*3/uL (ref 150.0–400.0)
RBC: 4.75 Mil/uL (ref 4.22–5.81)
RDW: 13.5 % (ref 11.5–15.5)
WBC: 9.3 10*3/uL (ref 4.0–10.5)

## 2021-03-19 LAB — LIPID PANEL
Cholesterol: 110 mg/dL (ref 0–200)
HDL: 29.3 mg/dL — ABNORMAL LOW (ref >39.00)
LDL Cholesterol: 59 mg/dL (ref 0–99)
NonHDL: 80.77
Total CHOL/HDL Ratio: 4
Triglycerides: 111 mg/dL (ref 0.0–149.0)
VLDL: 22.2 mg/dL (ref 0.0–40.0)

## 2021-03-19 LAB — HEMOGLOBIN A1C: Hgb A1c MFr Bld: 5.9 % (ref 4.6–6.5)

## 2021-03-19 LAB — TSH: TSH: 2.96 u[IU]/mL (ref 0.35–5.50)

## 2021-03-22 ENCOUNTER — Other Ambulatory Visit: Payer: Self-pay

## 2021-03-23 ENCOUNTER — Encounter: Payer: Self-pay | Admitting: Family Medicine

## 2021-03-23 ENCOUNTER — Ambulatory Visit (INDEPENDENT_AMBULATORY_CARE_PROVIDER_SITE_OTHER): Payer: Medicare Other | Admitting: Family Medicine

## 2021-03-23 DIAGNOSIS — M1A9XX0 Chronic gout, unspecified, without tophus (tophi): Secondary | ICD-10-CM | POA: Diagnosis not present

## 2021-03-23 DIAGNOSIS — E782 Mixed hyperlipidemia: Secondary | ICD-10-CM | POA: Diagnosis not present

## 2021-03-23 DIAGNOSIS — G6289 Other specified polyneuropathies: Secondary | ICD-10-CM

## 2021-03-23 DIAGNOSIS — E1169 Type 2 diabetes mellitus with other specified complication: Secondary | ICD-10-CM | POA: Diagnosis not present

## 2021-03-23 DIAGNOSIS — R42 Dizziness and giddiness: Secondary | ICD-10-CM | POA: Diagnosis not present

## 2021-03-23 DIAGNOSIS — H9190 Unspecified hearing loss, unspecified ear: Secondary | ICD-10-CM | POA: Insufficient documentation

## 2021-03-23 DIAGNOSIS — E669 Obesity, unspecified: Secondary | ICD-10-CM

## 2021-03-23 DIAGNOSIS — I10 Essential (primary) hypertension: Secondary | ICD-10-CM | POA: Diagnosis not present

## 2021-03-23 DIAGNOSIS — H9193 Unspecified hearing loss, bilateral: Secondary | ICD-10-CM | POA: Diagnosis not present

## 2021-03-23 DIAGNOSIS — D649 Anemia, unspecified: Secondary | ICD-10-CM | POA: Diagnosis not present

## 2021-03-23 DIAGNOSIS — G629 Polyneuropathy, unspecified: Secondary | ICD-10-CM | POA: Insufficient documentation

## 2021-03-23 NOTE — Assessment & Plan Note (Signed)
He sees his podiatrist tomorrow and he is encouraged to discuss his increasing pain and numbness in his toes and ball of his feet. Consider new orthotics.

## 2021-03-23 NOTE — Progress Notes (Signed)
Subjective:   By signing my name below, I, Burnett Corrente, attest that this documentation has been prepared under the direction and in the presence of Penni Homans 03/23/2021   Patient ID: Evan Best, male    DOB: 1950-09-24, 70 y.o.   MRN: [id number]  Chief Complaint  Patient presents with   3 months F/U   Hypertension    HPI Patient is in today for an office visit on chronic medical concerns with hx of hypertension and diabetes. He reports he is having trouble with pain in his left foot at times. He has pain in his left big toe at times and pain in the ball of his left foot as well. He notes his balance his balance has been off some but no fall or injury. No recent febrile illness or hospitalizations. He is trying to eat a diabetic diet and to stay active.  Patient denies any blood in stool, chest pains, palpitations, shortness of breath, congestion, fever, of GI or GU complaints.  Past Medical History:  Diagnosis Date   Allergic state 09/22/2013   Anemia 09/07/2015   Arthritis 03/06/2015   Diabetes mellitus type II 1/09   Dysphagia 05/04/2017   Esophageal reflux 03/06/2015   Gout 09/18/2012   Left big toe   Hypertension    Insomnia 09/13/2015   Migraine headache    remote history   OSA (obstructive sleep apnea)    Preventative health care 09/05/2014   Right shoulder pain 02/19/2009   Qualifier: Diagnosis of  By: Wynona Luna    RLS (restless legs syndrome) 09/07/2015   Sleep apnea    uses cpap   Tremor 09/07/2015    Past Surgical History:  Procedure Laterality Date   HERNIA REPAIR  [id number]   umbilical hernia repair   KNEE SURGERY     left knee surgery   TONSILLECTOMY  1959   VASECTOMY      Family History  Problem Relation Age of Onset   Breast cancer Mother    Hypertension Mother    Hyperlipidemia Father    Kidney disease Father    Heart disease Father    Hypertension Father    Colon cancer Neg Hx    Stomach cancer Neg Hx     Social History    Socioeconomic History   Marital status: Married    Spouse name: Not on file   Number of children: 1   Years of education: Not on file   Highest education level: Not on file  Occupational History   Not on file  Tobacco Use   Smoking status: Former    Years: 5.00    Types: Cigarettes    Quit date: 2005    Years since quitting: 17.7   Smokeless tobacco: Never   Tobacco comments:    1 pack per year  Substance and Sexual Activity   Alcohol use: No   Drug use: No   Sexual activity: Yes  Other Topics Concern   Not on file  Social History Narrative   Occupation:  Chemical engineer   Married  -  1 daughter (age 32)    Never Smoked   Alcohol use-no     Drug use-no          Social Determinants of Health   Financial Resource Strain: Not on file  Food Insecurity: Not on file  Transportation Needs: Not on file  Physical Activity: Not on file  Stress: Not on file  Social Connections: Not  on file  Intimate Partner Violence: Not on file    Outpatient Medications Prior to Visit  Medication Sig Dispense Refill   amLODipine (NORVASC) 10 MG tablet TAKE 1 TABLET BY MOUTH EVERY DAY 90 tablet 1   Calcium-Magnesium-Zinc 1000-400-15 MG TABS Take by mouth daily.     cetirizine (ZYRTEC) 10 MG tablet Take 10 mg by mouth daily.     Chelated Potassium 99 MG TABS Take 99 mg by mouth 2 (two) times daily.     Cholecalciferol (D3 ADULT PO) Take by mouth.     Cinnamon 500 MG capsule Take 500 mg 3 (three) times daily by mouth. +50 mg Chromium in each supplement.     Coenzyme Q10 (CO Q-10) 100 MG CAPS Take 200 mg by mouth daily.     Cyanocobalamin (VITAMIN B 12 PO) Take 1 capsule daily by mouth.     famotidine (PEPCID) 40 MG tablet TAKE 1 TABLET BY MOUTH EVERY DAY 90 tablet 1   ferrous sulfate 325 (65 FE) MG tablet Take 1 tablet (325 mg total) by mouth every other day. 90 tablet 3   Glucosamine-Chondroitin-Vit D3 1500-1200-800 MG-MG-UNIT PACK Take by mouth.     glucose blood test strip One  touch Ultra blue -Check sugars daily-  DX E11.69 100 each 11   lisinopril (ZESTRIL) 40 MG tablet TAKE 1 TABLET BY MOUTH EVERY DAY 90 tablet 1   Melatonin 10 MG TABS Take by mouth.     metFORMIN (GLUCOPHAGE) 500 MG tablet TAKE 2 TABLETS (1,000 MG TOTAL) BY MOUTH 2 (TWO) TIMES DAILY WITH A MEAL. 360 tablet 1   Misc Natural Products (TART CHERRY ADVANCED PO) Take by mouth.     Omega-3 Fatty Acids (FISH OIL PO) Take 1,500 mg by mouth daily.     OMEPRAZOLE PO      Quercetin 250 MG TABS Take by mouth 2 (two) times daily.      simvastatin (ZOCOR) 10 MG tablet TAKE 1 TABLET BY MOUTH EVERYDAY AT BEDTIME 90 tablet 1   TURMERIC PO Take by mouth.     vitamin C (ASCORBIC ACID) 500 MG tablet Take 500 mg by mouth 3 (three) times daily.     No facility-administered medications prior to visit.    No Known Allergies  Review of Systems  Constitutional:  Negative for chills and malaise/fatigue.  HENT:  Negative for congestion and sore throat.   Eyes:  Negative for pain and redness.  Respiratory:  Negative for cough, shortness of breath and wheezing.   Cardiovascular:  Negative for chest pain, palpitations and leg swelling.  Gastrointestinal:  Negative for abdominal pain, blood in stool, nausea and vomiting.  Genitourinary:  Negative for dysuria, frequency and urgency.  Musculoskeletal:  Negative for falls and myalgias.  Neurological:  Negative for dizziness and weakness.  Psychiatric/Behavioral:  Negative for depression and memory loss. The patient is not nervous/anxious and does not have insomnia.       Objective:    Physical Exam Constitutional:      General: He is not in acute distress.    Appearance: Normal appearance. He is not ill-appearing.  HENT:     Head: Normocephalic and atraumatic.     Right Ear: Tympanic membrane and external ear normal.     Left Ear: Tympanic membrane and external ear normal.  Eyes:     Extraocular Movements: Extraocular movements intact.     Pupils: Pupils are  equal, round, and reactive to light.  Cardiovascular:     Rate  and Rhythm: Normal rate and regular rhythm.     Pulses: Normal pulses.     Heart sounds: Normal heart sounds. No murmur heard. Pulmonary:     Effort: Pulmonary effort is normal.     Breath sounds: Normal breath sounds.  Abdominal:     General: Abdomen is flat.     Palpations: Abdomen is soft. There is no mass.  Musculoskeletal:     Cervical back: Normal range of motion and neck supple.  Skin:    General: Skin is warm and dry.  Neurological:     Mental Status: He is alert and oriented to person, place, and time.     Deep Tendon Reflexes: Reflexes normal.  Psychiatric:        Mood and Affect: Mood normal.        Behavior: Behavior normal.    BP 126/72   Pulse 92   Temp (!) 97.5 F (36.4 C)   Resp 16   Wt 210 lb (95.3 kg)   SpO2 99%   BMI 29.29 kg/m  Wt Readings from Last 3 Encounters:  03/23/21 210 lb (95.3 kg)  11/30/20 212 lb 9.6 oz (96.4 kg)  10/05/20 210 lb 12.8 oz (95.6 kg)    Diabetic Foot Exam - Simple   No data filed    Lab Results  Component Value Date   WBC 9.3 03/19/2021   HGB 14.8 03/19/2021   HCT 41.2 03/19/2021   PLT 294.0 03/19/2021   GLUCOSE 123 (H) 03/19/2021   CHOL 110 03/19/2021   TRIG 111.0 03/19/2021   HDL 29.30 (L) 03/19/2021   LDLDIRECT 74.0 08/03/2017   LDLCALC 59 03/19/2021   ALT 22 03/19/2021   AST 17 03/19/2021   NA 139 03/19/2021   K 3.7 03/19/2021   CL 103 03/19/2021   CREATININE 1.01 03/19/2021   BUN 15 03/19/2021   CO2 28 03/19/2021   TSH 2.96 03/19/2021   PSA 0.61 02/08/2018   HGBA1C 5.9 03/19/2021   MICROALBUR 1.66 03/15/2013    Lab Results  Component Value Date   TSH 2.96 03/19/2021   Lab Results  Component Value Date   WBC 9.3 03/19/2021   HGB 14.8 03/19/2021   HCT 41.2 03/19/2021   MCV 86.8 03/19/2021   PLT 294.0 03/19/2021   Lab Results  Component Value Date   NA 139 03/19/2021   K 3.7 03/19/2021   CO2 28 03/19/2021   GLUCOSE 123 (H)  03/19/2021   BUN 15 03/19/2021   CREATININE 1.01 03/19/2021   BILITOT 0.7 03/19/2021   ALKPHOS 45 03/19/2021   AST 17 03/19/2021   ALT 22 03/19/2021   PROT 6.7 03/19/2021   ALBUMIN 4.1 03/19/2021   CALCIUM 8.9 03/19/2021   GFR 76.00 03/19/2021   Lab Results  Component Value Date   CHOL 110 03/19/2021   Lab Results  Component Value Date   HDL 29.30 (L) 03/19/2021   Lab Results  Component Value Date   LDLCALC 59 03/19/2021   Lab Results  Component Value Date   TRIG 111.0 03/19/2021   Lab Results  Component Value Date   CHOLHDL 4 03/19/2021   Lab Results  Component Value Date   HGBA1C 5.9 03/19/2021       Assessment & Plan:   Problem List Items Addressed This Visit     Hyperlipidemia, mixed    Tolerating statin, encouraged heart healthy diet, avoid trans fats, minimize simple carbs and saturated fats. Increase exercise as tolerated  Relevant Orders   Comprehensive metabolic panel   Lipid panel   Essential hypertension    Well controlled, no changes to meds. Encouraged heart healthy diet such as the DASH diet and exercise as tolerated.       Relevant Orders   TSH   Diabetes mellitus type 2 in obese (HCC)    hgba1c acceptable, minimize simple carbs. Increase exercise as tolerated.      Relevant Orders   Hemoglobin A1c   Gout    No recent.flares, Hydrate and monitor. She does have pain left big toe at times but none at this time.      Relevant Orders   Uric acid   Anemia    Improved with current treatment no changes to treatment at this time      Relevant Orders   CBC   Peripheral neuropathy    He sees his podiatrist tomorrow and he is encouraged to discuss his increasing pain and numbness in his toes and ball of his feet. Consider new orthotics.       Hearing loss    Mild, he notes having to turn up the TV and has more trouble hearing his wife when she mumbles. He passed the whisper hearing loss test at 5 feet. Will let us know if wants  consider referral for further evaluation      Disequilibrium    He notes increasing issues with his balance but falls or injury. Can consider physical therapy if worsens. Encouraged to increase exercise and to stay active.        F/U in 3 months  No orders of the defined types were placed in this encounter.   I, Penni Homans, MD, personally preformed the services described in this documentation.  All medical record entries made by the scribe were at my direction and in my presence.  I have reviewed the chart and discharge instructions (if applicable) and agree that the record reflects my personal performance and is accurate and complete. Penni Homans 03/23/2021    I,Jada Bradford,acting as a scribe for Penni Homans, MD.,have documented all relevant documentation on the behalf of Penni Homans, MD,as directed by  Penni Homans, MD while in the presence of Penni Homans, MD.   I, [name], MD personally performed the services described in this documentation. All medical record entries made by the scribe were at my direction and in my presence. I have reviewed the chart and agree that the record reflects my personal performance and is accurate and complete    Penni Homans, MD

## 2021-03-23 NOTE — Patient Instructions (Signed)
Carbohydrate Counting for Diabetes Mellitus, Adult Carbohydrate counting is a method of keeping track of how many carbohydrates you eat. Eating carbohydrates naturally increases the amount of sugar (glucose) in the blood. Counting how many carbohydrates you eat improves your blood glucose control, which helps you manage your diabetes. It is important to know how many carbohydrates you can safely have in each meal. This is different for every person. A dietitian can help you make a meal plan and calculate how many carbohydrates you should have at each meal and snack. What foods contain carbohydrates? Carbohydrates are found in the following foods: Grains, such as breads and cereals. Dried beans and soy products. Starchy vegetables, such as potatoes, peas, and corn. Fruit and fruit juices. Milk and yogurt. Sweets and snack foods, such as cake, cookies, candy, chips, and soft drinks. How do I count carbohydrates in foods? There are two ways to count carbohydrates in food. You can read food labels or learn standard serving sizes of foods. You can use either of the methods or a combination of both. Using the Nutrition Facts label The Nutrition Facts list is included on the labels of almost all packaged foods and beverages in the [place] It includes: The serving size. Information about nutrients in each serving, including the grams (g) of carbohydrate per serving. To use the Nutrition Facts: Decide how many servings you will have. Multiply the number of servings by the number of carbohydrates per serving. The resulting number is the total amount of carbohydrates that you will be having. Learning the standard serving sizes of foods When you eat carbohydrate foods that are not packaged or do not include Nutrition Facts on the label, you need to measure the servings in order to count the amount of carbohydrates. Measure the foods that you will eat with a food scale or measuring cup, if needed. Decide how  many standard-size servings you will eat. Multiply the number of servings by 15. For foods that contain carbohydrates, one serving equals 15 g of carbohydrates. For example, if you eat 2 cups or 10 oz (300 g) of strawberries, you will have eaten 2 servings and 30 g of carbohydrates (2 servings x 15 g = 30 g). For foods that have more than one food mixed, such as soups and casseroles, you must count the carbohydrates in each food that is included. The following list contains standard serving sizes of common carbohydrate-rich foods. Each of these servings has about 15 g of carbohydrates: 1 slice of bread. 1 six-inch (15 cm) tortilla. ? cup or 2 oz (53 g) cooked rice or pasta.  cup or 3 oz (85 g) cooked or canned, drained and rinsed beans or lentils.  cup or 3 oz (85 g) starchy vegetable, such as peas, corn, or squash.  cup or 4 oz (120 g) hot cereal.  cup or 3 oz (85 g) boiled or mashed potatoes, or  or 3 oz (85 g) of a large baked potato.  cup or 4 fl oz (118 mL) fruit juice. 1 cup or 8 fl oz (237 mL) milk. 1 small or 4 oz (106 g) apple.  or 2 oz (63 g) of a medium banana. 1 cup or 5 oz (150 g) strawberries. 3 cups or 1 oz (24 g) popped popcorn. What is an example of carbohydrate counting? To calculate the number of carbohydrates in this sample meal, follow the steps shown below. Sample meal 3 oz (85 g) chicken breast. ? cup or 4 oz (106 g) brown   rice.  cup or 3 oz (85 g) corn. 1 cup or 8 fl oz (237 mL) milk. 1 cup or 5 oz (150 g) strawberries with sugar-free whipped topping. Carbohydrate calculation Identify the foods that contain carbohydrates: Rice. Corn. Milk. Strawberries. Calculate how many servings you have of each food: 2 servings rice. 1 serving corn. 1 serving milk. 1 serving strawberries. Multiply each number of servings by 15 g: 2 servings rice x 15 g = 30 g. 1 serving corn x 15 g = 15 g. 1 serving milk x 15 g = 15 g. 1 serving strawberries x 15 g = 15  g. Add together all of the amounts to find the total grams of carbohydrates eaten: 30 g + 15 g + 15 g + 15 g = 75 g of carbohydrates total. What are tips for following this plan? Shopping Develop a meal plan and then make a shopping list. Buy fresh and frozen vegetables, fresh and frozen fruit, dairy, eggs, beans, lentils, and whole grains. Look at food labels. Choose foods that have more fiber and less sugar. Avoid processed foods and foods with added sugars. Meal planning Aim to have the same amount of carbohydrates at each meal and for each snack time. Plan to have regular, balanced meals and snacks. Where to find more information American Diabetes Association: www.diabetes.org Centers for Disease Control and Prevention: www.cdc.gov Summary Carbohydrate counting is a method of keeping track of how many carbohydrates you eat. Eating carbohydrates naturally increases the amount of sugar (glucose) in the blood. Counting how many carbohydrates you eat improves your blood glucose control, which helps you manage your diabetes. A dietitian can help you make a meal plan and calculate how many carbohydrates you should have at each meal and snack. This information is not intended to replace advice given to you by your health care provider. Make sure you discuss any questions you have with your health care provider. Document Revised: 06/27/2019 Document Reviewed: 06/28/2019 Elsevier Patient Education  2021 [employer]  

## 2021-03-23 NOTE — Assessment & Plan Note (Signed)
Tolerating statin, encouraged heart healthy diet, avoid trans fats, minimize simple carbs and saturated fats. Increase exercise as tolerated 

## 2021-03-23 NOTE — Assessment & Plan Note (Addendum)
No recent.flares, Hydrate and monitor. She does have pain left big toe at times but none at this time.

## 2021-03-23 NOTE — Assessment & Plan Note (Signed)
Well controlled, no changes to meds. Encouraged heart healthy diet such as the DASH diet and exercise as tolerated.  °

## 2021-03-23 NOTE — Assessment & Plan Note (Signed)
Improved with current treatment no changes to treatment at this time

## 2021-03-23 NOTE — Assessment & Plan Note (Signed)
hgba1c acceptable, minimize simple carbs. Increase exercise as tolerated.  

## 2021-03-23 NOTE — Assessment & Plan Note (Signed)
Mild, he notes having to turn up the TV and has more trouble hearing his wife when she mumbles. He passed the whisper hearing loss test at 5 feet. Will let us know if wants consider referral for further evaluation

## 2021-03-24 ENCOUNTER — Ambulatory Visit: Payer: Medicare Other | Admitting: Podiatry

## 2021-03-24 DIAGNOSIS — R42 Dizziness and giddiness: Secondary | ICD-10-CM | POA: Insufficient documentation

## 2021-03-24 NOTE — Assessment & Plan Note (Signed)
He notes increasing issues with his balance but falls or injury. Can consider physical therapy if worsens. Encouraged to increase exercise and to stay active.

## 2021-04-05 DIAGNOSIS — D234 Other benign neoplasm of skin of scalp and neck: Secondary | ICD-10-CM | POA: Diagnosis not present

## 2021-04-05 DIAGNOSIS — L723 Sebaceous cyst: Secondary | ICD-10-CM | POA: Diagnosis not present

## 2021-04-05 DIAGNOSIS — L821 Other seborrheic keratosis: Secondary | ICD-10-CM | POA: Diagnosis not present

## 2021-04-05 DIAGNOSIS — D1801 Hemangioma of skin and subcutaneous tissue: Secondary | ICD-10-CM | POA: Diagnosis not present

## 2021-04-05 DIAGNOSIS — I8311 Varicose veins of right lower extremity with inflammation: Secondary | ICD-10-CM | POA: Diagnosis not present

## 2021-04-05 DIAGNOSIS — D225 Melanocytic nevi of trunk: Secondary | ICD-10-CM | POA: Diagnosis not present

## 2021-04-05 DIAGNOSIS — L814 Other melanin hyperpigmentation: Secondary | ICD-10-CM | POA: Diagnosis not present

## 2021-04-13 ENCOUNTER — Other Ambulatory Visit: Payer: Self-pay | Admitting: Family Medicine

## 2021-04-19 ENCOUNTER — Telehealth: Payer: Self-pay | Admitting: Family Medicine

## 2021-04-19 NOTE — Telephone Encounter (Signed)
Left message for patient to call back and schedule Medicare Annual Wellness Visit (AWV) in office.   If not able to come in office, please offer to do virtually or by telephone.  Left office number and my jabber [phone].  Last AWV:03/22/2019  Please schedule at anytime with Nurse Health Advisor.

## 2021-05-11 ENCOUNTER — Ambulatory Visit (INDEPENDENT_AMBULATORY_CARE_PROVIDER_SITE_OTHER): Payer: Medicare Other

## 2021-05-11 VITALS — Ht 71.0 in | Wt 210.0 lb

## 2021-05-11 DIAGNOSIS — Z0000 Encounter for general adult medical examination without abnormal findings: Secondary | ICD-10-CM

## 2021-05-11 NOTE — Progress Notes (Signed)
Subjective:   RYKIN ROUTE is a 70 y.o. male who presents for Medicare Annual/Subsequent preventive examination.  I connected with Coran today by telephone and verified that I am speaking with the correct person using two identifiers. Location patient: home Location provider: work Persons participating in the virtual visit: patient, Marine scientist.    I discussed the limitations, risks, security and privacy concerns of performing an evaluation and management service by telephone and the availability of in person appointments. I also discussed with the patient that there may be a patient responsible charge related to this service. The patient expressed understanding and verbally consented to this telephonic visit.    Interactive audio and video telecommunications were attempted between this provider and patient, however failed, due to patient having technical difficulties OR patient did not have access to video capability.  We continued and completed visit with audio only.  Some vital signs may be absent or patient reported.   Time Spent with patient on telephone encounter: 20 minutes   Review of Systems     Cardiac Risk Factors include: advanced age (>110men, >33 women);male gender;diabetes mellitus;dyslipidemia;hypertension;sedentary lifestyle     Objective:    Today's Vitals   05/11/21 1300  Weight: 210 lb (95.3 kg)  Height: 5\' 11"  (1.803 m)   Body mass index is 29.29 kg/m.  Advanced Directives 05/11/2021 03/22/2019 03/15/2018 03/15/2018  Does Patient Have a Medical Advance Directive? No No No Yes  Does patient want to make changes to medical advance directive? - - No - Patient declined -  Would patient like information on creating a medical advance directive? No - Patient declined No - Patient declined - -    Current Medications (verified) Outpatient Encounter Medications as of 05/11/2021  Medication Sig   amLODipine (NORVASC) 10 MG tablet TAKE 1 TABLET BY MOUTH EVERY DAY    Calcium-Magnesium-Zinc 1000-400-15 MG TABS Take by mouth daily.   cetirizine (ZYRTEC) 10 MG tablet Take 10 mg by mouth daily.   Chelated Potassium 99 MG TABS Take 99 mg by mouth 2 (two) times daily.   Cholecalciferol (D3 ADULT PO) Take by mouth.   Cinnamon 500 MG capsule Take 500 mg 3 (three) times daily by mouth. +50 mg Chromium in each supplement.   Coenzyme Q10 (CO Q-10) 100 MG CAPS Take 200 mg by mouth daily.   Cyanocobalamin (VITAMIN B 12 PO) Take 1 capsule daily by mouth.   famotidine (PEPCID) 40 MG tablet TAKE 1 TABLET BY MOUTH EVERY DAY   ferrous sulfate 325 (65 FE) MG tablet Take 1 tablet (325 mg total) by mouth every other day.   Glucosamine-Chondroitin-Vit D3 1500-1200-800 MG-MG-UNIT PACK Take by mouth.   glucose blood test strip One touch Ultra blue -Check sugars daily-  DX E11.69   lisinopril (ZESTRIL) 40 MG tablet TAKE 1 TABLET BY MOUTH EVERY DAY   Melatonin 10 MG TABS Take by mouth.   metFORMIN (GLUCOPHAGE) 500 MG tablet TAKE 2 TABLETS (1,000 MG TOTAL) BY MOUTH 2 (TWO) TIMES DAILY WITH A MEAL.   Misc Natural Products (TART CHERRY ADVANCED PO) Take by mouth.   Omega-3 Fatty Acids (FISH OIL PO) Take 1,500 mg by mouth daily.   OMEPRAZOLE PO    Quercetin 250 MG TABS Take by mouth 2 (two) times daily.    simvastatin (ZOCOR) 10 MG tablet TAKE 1 TABLET BY MOUTH EVERYDAY AT BEDTIME   TURMERIC PO Take by mouth.   vitamin C (ASCORBIC ACID) 500 MG tablet Take 500 mg by mouth 3 (  three) times daily.   No facility-administered encounter medications on file as of 05/11/2021.    Allergies (verified) Patient has no known allergies.   History: Past Medical History:  Diagnosis Date   Allergic state 09/22/2013   Anemia 09/07/2015   Arthritis 03/06/2015   Diabetes mellitus type II 1/09   Dysphagia 05/04/2017   Esophageal reflux 03/06/2015   Gout 09/18/2012   Left big toe   Hypertension    Insomnia 09/13/2015   Migraine headache    remote history   OSA (obstructive sleep apnea)     Preventative health care 09/05/2014   Right shoulder pain 02/19/2009   Qualifier: Diagnosis of  By: Wynona Luna    RLS (restless legs syndrome) 09/07/2015   Sleep apnea    uses cpap   Tremor 09/07/2015   Past Surgical History:  Procedure Laterality Date   HERNIA REPAIR  1914   umbilical hernia repair   KNEE SURGERY     left knee surgery   TONSILLECTOMY  1959   VASECTOMY     Family History  Problem Relation Age of Onset   Breast cancer Mother    Hypertension Mother    Hyperlipidemia Father    Kidney disease Father    Heart disease Father    Hypertension Father    Colon cancer Neg Hx    Stomach cancer Neg Hx    Social History   Socioeconomic History   Marital status: Married    Spouse name: Not on file   Number of children: 1   Years of education: Not on file   Highest education level: Not on file  Occupational History   Not on file  Tobacco Use   Smoking status: Former    Years: 5.00    Types: Cigarettes    Quit date: 2005    Years since quitting: 17.8   Smokeless tobacco: Never   Tobacco comments:    1 pack per year  Substance and Sexual Activity   Alcohol use: No   Drug use: No   Sexual activity: Yes  Other Topics Concern   Not on file  Social History Narrative   Occupation:  Chemical engineer   Married  -  1 daughter (age 32)    Never Smoked   Alcohol use-no     Drug use-no          Social Determinants of Health   Financial Resource Strain: Low Risk    Difficulty of Paying Living Expenses: Not hard at all  Food Insecurity: No Food Insecurity   Worried About Charity fundraiser in the Last Year: Never true   Arboriculturist in the Last Year: Never true  Transportation Needs: No Transportation Needs   Lack of Transportation (Medical): No   Lack of Transportation (Non-Medical): No  Physical Activity: Inactive   Days of Exercise per Week: 0 days   Minutes of Exercise per Session: 0 min  Stress: No Stress Concern Present   Feeling of Stress  : Not at all  Social Connections: Socially Isolated   Frequency of Communication with Friends and Family: Once a week   Frequency of Social Gatherings with Friends and Family: Once a week   Attends Religious Services: Never   Marine scientist or Organizations: No   Attends Archivist Meetings: Never   Marital Status: Married    Tobacco Counseling Counseling given: Not Answered Tobacco comments: 1 pack per year   Clinical Intake:  Pre-visit preparation completed: Yes  Pain : No/denies pain     BMI - recorded: 29.29 Nutritional Status: BMI 25 -29 Overweight Nutritional Risks: None Diabetes: Yes CBG done?: No Did pt. bring in CBG monitor from home?: No (phone visit)  How often do you need to have someone help you when you read instructions, pamphlets, or other written materials from your doctor or pharmacy?: 1 - Never  Diabetes:  Is the patient diabetic?  Yes  If diabetic, was a CBG obtained today?  No  Did the patient bring in their glucometer from home?  No phone visit How often do you monitor your CBG's? occasionally.   Financial Strains and Diabetes Management:  Are you having any financial strains with the device, your supplies or your medication? No .  Does the patient want to be seen by Chronic Care Management for management of their diabetes?  No  Would the patient like to be referred to a Nutritionist or for Diabetic Management?  No   Diabetic Exams:  Diabetic Eye Exam: Completed 10/21/2020.    Diabetic Foot Exam: Pt has been advised about the importance in completing this exam. To be completed by PCP.   Interpreter Needed?: No  Information entered by :: Caroleen Hamman LPN   Activities of Daily Living In your present state of health, do you have any difficulty performing the following activities: 05/11/2021 09/01/2020  Hearing? N N  Vision? N N  Difficulty concentrating or making decisions? Y N  Comment occasionally forgets names -   Walking or climbing stairs? N N  Dressing or bathing? N N  Doing errands, shopping? N N  Preparing Food and eating ? N -  Using the Toilet? N -  In the past six months, have you accidently leaked urine? Y -  Do you have problems with loss of bowel control? Y -  Comment occasionally -  Managing your Medications? N -  Managing your Finances? N -  Housekeeping or managing your Housekeeping? N -  Some recent data might be hidden    Patient Care Team: [name], MD as PCP - General (Family Medicine) Clent Jacks, MD as Consulting Physician (Ophthalmology)  Indicate any recent Medical Services you may have received from other than [facility] providers in the past year (date may be approximate).     Assessment:   This is a routine wellness examination for Brenner.  Hearing/Vision screen Hearing Screening - Comments:: No issues Vision Screening - Comments:: Wears glasses Last eye exam-within the last year-Dr. Katy Fitch  Dietary issues and exercise activities discussed: Current Exercise Habits: The patient does not participate in regular exercise at present, Exercise limited by: Other - see comments (neuropathy in feet)   Goals Addressed               This Visit's Progress     Patient Stated     Maintain current health (pt-stated)   On track      Depression Screen PHQ 2/9 Scores 05/11/2021 11/30/2020 04/28/2020 03/22/2019 03/15/2018  PHQ - 2 Score 0 0 6 0 0  PHQ- 9 Score - - 10 - -    Fall Risk Fall Risk  05/11/2021 11/30/2020 04/28/2020 03/22/2019 03/15/2018  Falls in the past year? 0 0 0 0 Yes  Number falls in past yr: 0 0 0 - 1  Injury with Fall? 0 0 1 - No  Follow up Falls prevention discussed - - - Education provided;Falls prevention discussed    FALL RISK PREVENTION  PERTAINING TO THE HOME:  Any stairs in or around the home? Yes  If so, are there any without handrails? No  Home free of loose throw rugs in walkways, pet beds, electrical cords, etc? Yes  Adequate  lighting in your home to reduce risk of falls? Yes   ASSISTIVE DEVICES UTILIZED TO PREVENT FALLS:  Life alert? No  Use of a cane, walker or w/c? No  Grab bars in the bathroom? No  Shower chair or bench in shower? No  Elevated toilet seat or a handicapped toilet? No   TIMED UP AND GO:  Was the test performed? No . Phone visit   Cognitive Function:Normal cognitive status assessed by this Nurse Health Advisor. No abnormalities found.          Immunizations Immunization History  Administered Date(s) Administered   Fluad Quad(high Dose 65+) 04/24/2019, 06/01/2020   Influenza Split 05/13/2011, 05/11/2012   Influenza Whole 05/13/2009, 05/14/2010, 04/10/2013   Influenza, High Dose Seasonal PF 05/04/2017   Influenza-Unspecified 04/11/2014, 04/17/2015, 04/10/2018   PFIZER(Purple Top)SARS-COV-2 Vaccination 10/18/2019, 11/12/2019   Pneumococcal Conjugate-13 05/04/2017   Pneumococcal Polysaccharide-23 05/24/2018   Tdap 06/21/2011   Zoster Recombinat (Shingrix) 02/15/2018, 07/09/2018    TDAP status: Up to date  Flu Vaccine status: Due, Education has been provided regarding the importance of this vaccine. Advised may receive this vaccine at local pharmacy or Health Dept. Aware to provide a copy of the vaccination record if obtained from local pharmacy or Health Dept. Verbalized acceptance and understanding.  Pneumococcal vaccine status: Up to date  Covid-19 vaccine status: Declined, Education has been provided regarding the importance of this vaccine but patient still declined. Advised may receive this vaccine at local pharmacy or Health Dept.or vaccine clinic. Aware to provide a copy of the vaccination record if obtained from local pharmacy or Health Dept. Verbalized acceptance and understanding.  Qualifies for Shingles Vaccine? No   Zostavax completed No   Shingrix Completed?: Yes  Screening Tests Health Maintenance  Topic Date Due   FOOT EXAM  11/02/2019   COVID-19 Vaccine (3  - Pfizer risk series) 12/10/2019   INFLUENZA VACCINE  02/08/2021   TETANUS/TDAP  06/20/2021   HEMOGLOBIN A1C  09/16/2021   OPHTHALMOLOGY EXAM  10/21/2021   COLONOSCOPY (Pts 45-19yrs Insurance coverage will need to be confirmed)  07/07/2022   Pneumonia Vaccine 36+ Years old  Completed   Hepatitis C Screening  Completed   Zoster Vaccines- Shingrix  Completed   HPV VACCINES  Aged Out    Health Maintenance  Health Maintenance Due  Topic Date Due   FOOT EXAM  11/02/2019   COVID-19 Vaccine (3 - Pfizer risk series) 12/10/2019   INFLUENZA VACCINE  02/08/2021    Colorectal cancer screening: Type of screening: Colonoscopy. Completed 07/07/2017. Repeat every 5 years  Lung Cancer Screening: (Low Dose CT Chest recommended if Age 56-80 years, 30 pack-year currently smoking OR have quit w/in 15years.) does not qualify.    Additional Screening:  Hepatitis C Screening: Completed 09/01/2015  Vision Screening: Recommended annual ophthalmology exams for early detection of glaucoma and other disorders of the eye. Is the patient up to date with their annual eye exam?  Yes  Who is the provider or what is the name of the office in which the patient attends annual eye exams? Dr. Katy Fitch   Dental Screening: Recommended annual dental exams for proper oral hygiene  Community Resource Referral / Chronic Care Management: CRR required this visit?  No   CCM required this  visit?  No      Plan:     I have personally reviewed and noted the following in the patient's chart:   Medical and social history Use of alcohol, tobacco or illicit drugs  Current medications and supplements including opioid prescriptions. Patient is not currently taking opioid prescriptions. Functional ability and status Nutritional status Physical activity Advanced directives List of other physicians Hospitalizations, surgeries, and ER visits in previous 12 months Vitals Screenings to include cognitive, depression, and  falls Referrals and appointments  In addition, I have reviewed and discussed with patient certain preventive protocols, quality metrics, and best practice recommendations. A written personalized care plan for preventive services as well as general preventive health recommendations were provided to patient.   Due to this being a telephonic visit, the after visit summary with patients personalized plan was offered to patient via mail or my-chart.  Patient would like to access on my-chart.   Marta Antu, LPN   [id number]  Nurse Health Advisor  Nurse Notes: None

## 2021-05-11 NOTE — Patient Instructions (Signed)
Evan Best , Thank you for taking time to complete your Medicare Wellness Visit. I appreciate your ongoing commitment to your health goals. Please review the following plan we discussed and let me know if I can assist you in the future.   Screening recommendations/referrals: Colonoscopy: Completed 07/07/2017-Due 07/07/2022 Recommended yearly ophthalmology/optometry visit for glaucoma screening and checkup Recommended yearly dental visit for hygiene and checkup  Vaccinations: Influenza vaccine: Due-May obtain vaccine at our office or your local pharmacy. Pneumococcal vaccine: Up to date Tdap vaccine: Up to date-Due 06/20/2021 Shingles vaccine: Completed vaccines   Covid-19: Declined Booster  Advanced directives: Declined information today  Conditions/risks identified: See problem list  Next appointment: Follow up in one year for your annual wellness visit.   Preventive Care 41 Years and Older, Male Preventive care refers to lifestyle choices and visits with your health care provider that can promote health and wellness. What does preventive care include? A yearly physical exam. This is also called an annual well check. Dental exams once or twice a year. Routine eye exams. Ask your health care provider how often you should have your eyes checked. Personal lifestyle choices, including: Daily care of your teeth and gums. Regular physical activity. Eating a healthy diet. Avoiding tobacco and drug use. Limiting alcohol use. Practicing safe sex. Taking low doses of aspirin every day. Taking vitamin and mineral supplements as recommended by your health care provider. What happens during an annual well check? The services and screenings done by your health care provider during your annual well check will depend on your age, overall health, lifestyle risk factors, and family history of disease. Counseling  Your health care provider may ask you questions about your: Alcohol use. Tobacco  use. Drug use. Emotional well-being. Home and relationship well-being. Sexual activity. Eating habits. History of falls. Memory and ability to understand (cognition). Work and work Statistician. Screening  You may have the following tests or measurements: Height, weight, and BMI. Blood pressure. Lipid and cholesterol levels. These may be checked every 5 years, or more frequently if you are over 30 years old. Skin check. Lung cancer screening. You may have this screening every year starting at age 44 if you have a 30-pack-year history of smoking and currently smoke or have quit within the past 15 years. Fecal occult blood test (FOBT) of the stool. You may have this test every year starting at age 87. Flexible sigmoidoscopy or colonoscopy. You may have a sigmoidoscopy every 5 years or a colonoscopy every 10 years starting at age 61. Prostate cancer screening. Recommendations will vary depending on your family history and other risks. Hepatitis C blood test. Hepatitis B blood test. Sexually transmitted disease (STD) testing. Diabetes screening. This is done by checking your blood sugar (glucose) after you have not eaten for a while (fasting). You may have this done every 1-3 years. Abdominal aortic aneurysm (AAA) screening. You may need this if you are a current or former smoker. Osteoporosis. You may be screened starting at age 81 if you are at high risk. Talk with your health care provider about your test results, treatment options, and if necessary, the need for more tests. Vaccines  Your health care provider may recommend certain vaccines, such as: Influenza vaccine. This is recommended every year. Tetanus, diphtheria, and acellular pertussis (Tdap, Td) vaccine. You may need a Td booster every 10 years. Zoster vaccine. You may need this after age 72. Pneumococcal 13-valent conjugate (PCV13) vaccine. One dose is recommended after age 52. Pneumococcal polysaccharide (  PPSV23) vaccine.  One dose is recommended after age 59. Talk to your health care provider about which screenings and vaccines you need and how often you need them. This information is not intended to replace advice given to you by your health care provider. Make sure you discuss any questions you have with your health care provider. Document Released: 07/24/2015 Document Revised: 03/16/2016 Document Reviewed: 04/28/2015 Elsevier Interactive Patient Education  2017 [employer] Prevention in the Home Falls can cause injuries. They can happen to people of all ages. There are many things you can do to make your home safe and to help prevent falls. What can I do on the outside of my home? Regularly fix the edges of walkways and driveways and fix any cracks. Remove anything that might make you trip as you walk through a door, such as a raised step or threshold. Trim any bushes or trees on the path to your home. Use bright outdoor lighting. Clear any walking paths of anything that might make someone trip, such as rocks or tools. Regularly check to see if handrails are loose or broken. Make sure that both sides of any steps have handrails. Any raised decks and porches should have guardrails on the edges. Have any leaves, snow, or ice cleared regularly. Use sand or salt on walking paths during winter. Clean up any spills in your garage right away. This includes oil or grease spills. What can I do in the bathroom? Use night lights. Install grab bars by the toilet and in the tub and shower. Do not use towel bars as grab bars. Use non-skid mats or decals in the tub or shower. If you need to sit down in the shower, use a plastic, non-slip stool. Keep the floor dry. Clean up any water that spills on the floor as soon as it happens. Remove soap buildup in the tub or shower regularly. Attach bath mats securely with double-sided non-slip rug tape. Do not have throw rugs and other things on the floor that can make  you trip. What can I do in the bedroom? Use night lights. Make sure that you have a light by your bed that is easy to reach. Do not use any sheets or blankets that are too big for your bed. They should not hang down onto the floor. Have a firm chair that has side arms. You can use this for support while you get dressed. Do not have throw rugs and other things on the floor that can make you trip. What can I do in the kitchen? Clean up any spills right away. Avoid walking on wet floors. Keep items that you use a lot in easy-to-reach places. If you need to reach something above you, use a strong step stool that has a grab bar. Keep electrical cords out of the way. Do not use floor polish or wax that makes floors slippery. If you must use wax, use non-skid floor wax. Do not have throw rugs and other things on the floor that can make you trip. What can I do with my stairs? Do not leave any items on the stairs. Make sure that there are handrails on both sides of the stairs and use them. Fix handrails that are broken or loose. Make sure that handrails are as long as the stairways. Check any carpeting to make sure that it is firmly attached to the stairs. Fix any carpet that is loose or worn. Avoid having throw rugs at the top or  bottom of the stairs. If you do have throw rugs, attach them to the floor with carpet tape. Make sure that you have a light switch at the top of the stairs and the bottom of the stairs. If you do not have them, ask someone to add them for you. What else can I do to help prevent falls? Wear shoes that: Do not have high heels. Have rubber bottoms. Are comfortable and fit you well. Are closed at the toe. Do not wear sandals. If you use a stepladder: Make sure that it is fully opened. Do not climb a closed stepladder. Make sure that both sides of the stepladder are locked into place. Ask someone to hold it for you, if possible. Clearly mark and make sure that you can  see: Any grab bars or handrails. First and last steps. Where the edge of each step is. Use tools that help you move around (mobility aids) if they are needed. These include: Canes. Walkers. Scooters. Crutches. Turn on the lights when you go into a dark area. Replace any light bulbs as soon as they burn out. Set up your furniture so you have a clear path. Avoid moving your furniture around. If any of your floors are uneven, fix them. If there are any pets around you, be aware of where they are. Review your medicines with your doctor. Some medicines can make you feel dizzy. This can increase your chance of falling. Ask your doctor what other things that you can do to help prevent falls. This information is not intended to replace advice given to you by your health care provider. Make sure you discuss any questions you have with your health care provider. Document Released: 04/23/2009 Document Revised: 12/03/2015 Document Reviewed: 08/01/2014 Elsevier Interactive Patient Education  2017 [employer].

## 2021-05-19 ENCOUNTER — Encounter: Payer: Self-pay | Admitting: Podiatry

## 2021-05-19 ENCOUNTER — Other Ambulatory Visit: Payer: Self-pay

## 2021-05-19 ENCOUNTER — Ambulatory Visit (INDEPENDENT_AMBULATORY_CARE_PROVIDER_SITE_OTHER): Payer: Medicare Other | Admitting: Podiatry

## 2021-05-19 DIAGNOSIS — E119 Type 2 diabetes mellitus without complications: Secondary | ICD-10-CM

## 2021-05-19 DIAGNOSIS — M79674 Pain in right toe(s): Secondary | ICD-10-CM

## 2021-05-19 DIAGNOSIS — M79675 Pain in left toe(s): Secondary | ICD-10-CM | POA: Diagnosis not present

## 2021-05-19 DIAGNOSIS — B351 Tinea unguium: Secondary | ICD-10-CM | POA: Diagnosis not present

## 2021-05-19 DIAGNOSIS — L84 Corns and callosities: Secondary | ICD-10-CM

## 2021-05-23 DIAGNOSIS — I872 Venous insufficiency (chronic) (peripheral): Secondary | ICD-10-CM | POA: Insufficient documentation

## 2021-05-23 DIAGNOSIS — L814 Other melanin hyperpigmentation: Secondary | ICD-10-CM | POA: Insufficient documentation

## 2021-05-23 DIAGNOSIS — D225 Melanocytic nevi of trunk: Secondary | ICD-10-CM | POA: Insufficient documentation

## 2021-05-23 DIAGNOSIS — D1801 Hemangioma of skin and subcutaneous tissue: Secondary | ICD-10-CM | POA: Insufficient documentation

## 2021-05-23 DIAGNOSIS — D234 Other benign neoplasm of skin of scalp and neck: Secondary | ICD-10-CM | POA: Insufficient documentation

## 2021-05-23 DIAGNOSIS — I8311 Varicose veins of right lower extremity with inflammation: Secondary | ICD-10-CM | POA: Insufficient documentation

## 2021-05-23 DIAGNOSIS — D485 Neoplasm of uncertain behavior of skin: Secondary | ICD-10-CM | POA: Insufficient documentation

## 2021-05-23 NOTE — Progress Notes (Signed)
Subjective: Evan Best is a 70 y.o. male patient seen today for follow up of  painful thick toenails that are difficult to trim. Pain interferes with ambulation. Aggravating factors include wearing enclosed shoe gear. Pain is relieved with periodic professional debridement.  New problems reported today: None.  Patient is diabetic and states his blood glucose was 130 mg/dl today.  PCP is [name], MD. Last visit was: 11/30/2020.  No Known Allergies  Objective: Physical Exam  General: Patient is a pleasant 70 y.o. Caucasian male WD, WN in NAD. AAO x 3.   Neurovascular Examination: Capillary refill time to digits immediate b/l. Palpable DP pulse(s) b/l lower extremities Palpable PT pulse(s) b/l lower extremities Pedal hair sparse. No pain with calf compression b/l. Lower extremity skin temperature gradient within normal limits. No edema noted b/l LE.  Protective sensation intact 5/5 intact bilaterally with 10g monofilament b/l. Vibratory sensation intact b/l.  Dermatological:  Pedal integument with normal turgor, texture and tone b/l LE. No open wounds b/l. No interdigital macerations b/l. Toenails 1-5 b/l elongated, thickened, discolored with subungual debris. +Tenderness with dorsal palpation of nailplates. Hyperkeratotic lesion(s) noted R hallux.  Musculoskeletal:  Normal muscle strength 5/5 to all lower extremity muscle groups bilaterally. No pain, crepitus or joint limitation noted with ROM b/l LE. No gross bony pedal deformities b/l. Patient ambulates independently without assistive aids.  Assessment: 1. Pain due to onychomycosis of toenails of both feet   2. Callus   3. Controlled type 2 diabetes mellitus without complication, without long-term current use of insulin ([place])    Plan: Patient was evaluated and treated and all questions answered. Consent given for treatment as described below: -No new findings. No new orders. -Mycotic toenails 1-5 bilaterally were  debrided in length and girth with sterile nail nippers and dremel without incident. -Callus(es) R hallux pared utilizing sterile scalpel blade without complication or incident. Total number debrided =1. -Patient/POA to call should there be question/concern in the interim.  Return in about 3 months (around 08/19/2021).  Marzetta Board, DPM

## 2021-06-07 ENCOUNTER — Other Ambulatory Visit: Payer: Self-pay | Admitting: Family Medicine

## 2021-07-13 ENCOUNTER — Other Ambulatory Visit (INDEPENDENT_AMBULATORY_CARE_PROVIDER_SITE_OTHER): Payer: Medicare Other

## 2021-07-13 DIAGNOSIS — M1A9XX0 Chronic gout, unspecified, without tophus (tophi): Secondary | ICD-10-CM

## 2021-07-13 DIAGNOSIS — E782 Mixed hyperlipidemia: Secondary | ICD-10-CM | POA: Diagnosis not present

## 2021-07-13 DIAGNOSIS — D649 Anemia, unspecified: Secondary | ICD-10-CM

## 2021-07-13 DIAGNOSIS — E1169 Type 2 diabetes mellitus with other specified complication: Secondary | ICD-10-CM

## 2021-07-13 DIAGNOSIS — I10 Essential (primary) hypertension: Secondary | ICD-10-CM | POA: Diagnosis not present

## 2021-07-13 DIAGNOSIS — E669 Obesity, unspecified: Secondary | ICD-10-CM

## 2021-07-13 LAB — LIPID PANEL
Cholesterol: 119 mg/dL (ref 0–200)
HDL: 30.3 mg/dL — ABNORMAL LOW (ref >39.00)
LDL Cholesterol: 64 mg/dL (ref 0–99)
NonHDL: 88.97
Total CHOL/HDL Ratio: 4
Triglycerides: 125 mg/dL (ref 0.0–149.0)
VLDL: 25 mg/dL (ref 0.0–40.0)

## 2021-07-13 LAB — COMPREHENSIVE METABOLIC PANEL
ALT: 21 U/L (ref 0–53)
AST: 18 U/L (ref 0–37)
Albumin: 4.3 g/dL (ref 3.5–5.2)
Alkaline Phosphatase: 46 U/L (ref 39–117)
BUN: 16 mg/dL (ref 6–23)
CO2: 31 mEq/L (ref 19–32)
Calcium: 9.2 mg/dL (ref 8.4–10.5)
Chloride: 101 mEq/L (ref 96–112)
Creatinine, Ser: 1.13 mg/dL (ref 0.40–1.50)
GFR: 66.28 mL/min (ref >60.00)
Glucose, Bld: 119 mg/dL — ABNORMAL HIGH (ref 70–99)
Potassium: 3.7 mEq/L (ref 3.5–5.1)
Sodium: 141 mEq/L (ref 135–145)
Total Bilirubin: 0.7 mg/dL (ref 0.2–1.2)
Total Protein: 6.9 g/dL (ref 6.0–8.3)

## 2021-07-13 LAB — CBC
HCT: 43.1 % (ref 39.0–52.0)
Hemoglobin: 14.9 g/dL (ref 13.0–17.0)
MCHC: 34.7 g/dL (ref 30.0–36.0)
MCV: 88.8 fl (ref 78.0–100.0)
Platelets: 305 10*3/uL (ref 150.0–400.0)
RBC: 4.86 Mil/uL (ref 4.22–5.81)
RDW: 13.5 % (ref 11.5–15.5)
WBC: 9.1 10*3/uL (ref 4.0–10.5)

## 2021-07-13 LAB — HEMOGLOBIN A1C: Hgb A1c MFr Bld: 5.5 % (ref 4.6–6.5)

## 2021-07-13 LAB — URIC ACID: Uric Acid, Serum: 6.5 mg/dL (ref 4.0–7.8)

## 2021-07-13 LAB — TSH: TSH: 2.94 u[IU]/mL (ref 0.35–5.50)

## 2021-07-16 ENCOUNTER — Other Ambulatory Visit: Payer: Self-pay | Admitting: Family Medicine

## 2021-07-19 ENCOUNTER — Ambulatory Visit: Payer: Medicare Other | Admitting: Family Medicine

## 2021-07-26 ENCOUNTER — Encounter: Payer: Self-pay | Admitting: Family Medicine

## 2021-07-26 ENCOUNTER — Ambulatory Visit (INDEPENDENT_AMBULATORY_CARE_PROVIDER_SITE_OTHER): Payer: Medicare Other | Admitting: Family Medicine

## 2021-07-26 DIAGNOSIS — E1169 Type 2 diabetes mellitus with other specified complication: Secondary | ICD-10-CM | POA: Diagnosis not present

## 2021-07-26 DIAGNOSIS — M791 Myalgia, unspecified site: Secondary | ICD-10-CM

## 2021-07-26 DIAGNOSIS — I10 Essential (primary) hypertension: Secondary | ICD-10-CM

## 2021-07-26 DIAGNOSIS — E782 Mixed hyperlipidemia: Secondary | ICD-10-CM | POA: Diagnosis not present

## 2021-07-26 DIAGNOSIS — R413 Other amnesia: Secondary | ICD-10-CM

## 2021-07-26 DIAGNOSIS — R609 Edema, unspecified: Secondary | ICD-10-CM | POA: Diagnosis not present

## 2021-07-26 DIAGNOSIS — E669 Obesity, unspecified: Secondary | ICD-10-CM | POA: Diagnosis not present

## 2021-07-26 DIAGNOSIS — M1A9XX0 Chronic gout, unspecified, without tophus (tophi): Secondary | ICD-10-CM

## 2021-07-26 NOTE — Patient Instructions (Signed)

## 2021-07-26 NOTE — Assessment & Plan Note (Signed)
Tolerating statin, encouraged heart healthy diet, avoid trans fats, minimize simple carbs and saturated fats. Increase exercise as tolerated 

## 2021-07-26 NOTE — Assessment & Plan Note (Signed)
No recent flare.  

## 2021-07-26 NOTE — Assessment & Plan Note (Signed)
hgba1c acceptable, minimize simple carbs. Increase exercise as tolerated. Continue current meds 

## 2021-07-26 NOTE — Assessment & Plan Note (Signed)
Well controlled, no changes to meds. Encouraged heart healthy diet such as the DASH diet and exercise as tolerated.  °

## 2021-07-26 NOTE — Assessment & Plan Note (Signed)
Hydrate and monitor 

## 2021-07-26 NOTE — Progress Notes (Signed)
Subjective:    Patient ID: Evan Best, male    DOB: [birth year], 71 y.o.   MRN: [id number]  Chief Complaint  Patient presents with   3 months follow up    HPI Patient is in today for follow up on chronic medical concerns. Melatonin helps him fall asleep and he wears his CPAP machine nightly then sleeps typically 5.5 to 6 hours each night and then gets up. Often takes an afternoon nap. No recent febrile illness or hospitalizations. He notes his CPAP has registered no events for a few nights. Notes his memory frustrates him lately and he feels he is less sharp, forgets names more and misplaces things. He also notes some leaking after urination over the past few months. Denies CP/palp/SOB/HA/congestion/fevers/GI or GU c/o. Taking meds as prescribed   Past Medical History:  Diagnosis Date   Allergic state 09/22/2013   Anemia 09/07/2015   Arthritis 03/06/2015   Diabetes mellitus type II 1/09   Dysphagia 05/04/2017   Esophageal reflux 03/06/2015   Gout 09/18/2012   Left big toe   Hypertension    Insomnia 09/13/2015   Migraine headache    remote history   OSA (obstructive sleep apnea)    Preventative health care 09/05/2014   Right shoulder pain 02/19/2009   Qualifier: Diagnosis of  By: Wynona Luna    RLS (restless legs syndrome) 09/07/2015   Sleep apnea    uses cpap   Tremor 09/07/2015    Past Surgical History:  Procedure Laterality Date   HERNIA REPAIR  4268   umbilical hernia repair   KNEE SURGERY     left knee surgery   TONSILLECTOMY  1959   VASECTOMY      Family History  Problem Relation Age of Onset   Breast cancer Mother    Hypertension Mother    Hyperlipidemia Father    Kidney disease Father    Heart disease Father    Hypertension Father    Colon cancer Neg Hx    Stomach cancer Neg Hx     Social History   Socioeconomic History   Marital status: Married    Spouse name: Not on file   Number of children: 1   Years of education: Not on file   Highest  education level: Not on file  Occupational History   Not on file  Tobacco Use   Smoking status: Former    Years: 5.00    Types: Cigarettes    Quit date: 2005    Years since quitting: 18.0   Smokeless tobacco: Never   Tobacco comments:    1 pack per year  Substance and Sexual Activity   Alcohol use: No   Drug use: No   Sexual activity: Yes  Other Topics Concern   Not on file  Social History Narrative   Occupation:  Chemical engineer   Married  -  1 daughter (age 44)    Never Smoked   Alcohol use-no     Drug use-no          Social Determinants of Health   Financial Resource Strain: Low Risk    Difficulty of Paying Living Expenses: Not hard at all  Food Insecurity: No Food Insecurity   Worried About Charity fundraiser in the Last Year: Never true   Arboriculturist in the Last Year: Never true  Transportation Needs: No Transportation Needs   Lack of Transportation (Medical): No   Lack of Transportation (Non-Medical): No  Physical Activity: Inactive   Days of Exercise per Week: 0 days   Minutes of Exercise per Session: 0 min  Stress: No Stress Concern Present   Feeling of Stress : Not at all  Social Connections: Socially Isolated   Frequency of Communication with Friends and Family: Once a week   Frequency of Social Gatherings with Friends and Family: Once a week   Attends Religious Services: Never   Marine scientist or Organizations: No   Attends Music therapist: Never   Marital Status: Married  Human resources officer Violence: Not At Risk   Fear of Current or Ex-Partner: No   Emotionally Abused: No   Physically Abused: No   Sexually Abused: No    Outpatient Medications Prior to Visit  Medication Sig Dispense Refill   amLODipine (NORVASC) 10 MG tablet TAKE 1 TABLET BY MOUTH EVERY DAY 90 tablet 1   Calcium-Magnesium-Zinc 1000-400-15 MG TABS Take by mouth daily.     cetirizine (ZYRTEC) 10 MG tablet Take 10 mg by mouth daily.     Chelated  Potassium 99 MG TABS Take 99 mg by mouth 2 (two) times daily.     Cholecalciferol (D3 ADULT PO) Take by mouth.     Cinnamon 500 MG capsule Take 500 mg 3 (three) times daily by mouth. +50 mg Chromium in each supplement.     Coenzyme Q10 (CO Q-10) 100 MG CAPS Take 200 mg by mouth daily.     Cyanocobalamin (VITAMIN B 12 PO) Take 1 capsule daily by mouth.     famotidine (PEPCID) 40 MG tablet TAKE 1 TABLET BY MOUTH EVERY DAY 90 tablet 1   ferrous sulfate 325 (65 FE) MG tablet Take 1 tablet (325 mg total) by mouth every other day. 90 tablet 3   Glucosamine-Chondroitin-Vit D3 1500-1200-800 MG-MG-UNIT PACK Take by mouth.     glucose blood test strip One touch Ultra blue -Check sugars daily-  DX E11.69 100 each 11   lisinopril (ZESTRIL) 40 MG tablet TAKE 1 TABLET BY MOUTH EVERY DAY 90 tablet 1   Melatonin 10 MG TABS Take by mouth.     metFORMIN (GLUCOPHAGE) 500 MG tablet TAKE 2 TABLETS BY MOUTH TWICE A DAY WITH A MEAL 360 tablet 1   Misc Natural Products (TART CHERRY ADVANCED PO) Take by mouth.     Omega-3 Fatty Acids (FISH OIL PO) Take 1,500 mg by mouth daily.     OMEPRAZOLE PO      Quercetin 250 MG TABS Take by mouth 2 (two) times daily.      simvastatin (ZOCOR) 10 MG tablet TAKE 1 TABLET BY MOUTH EVERYDAY AT BEDTIME 90 tablet 1   triamcinolone cream (KENALOG) 0.1 % APPLY TO AFFECTED AREA TWICE A DAY FOR 14 DAYS     TURMERIC PO Take by mouth.     vitamin C (ASCORBIC ACID) 500 MG tablet Take 500 mg by mouth 3 (three) times daily.     No facility-administered medications prior to visit.    No Known Allergies  Review of Systems  Constitutional:  Negative for fever and malaise/fatigue.  HENT:  Negative for congestion.   Eyes:  Negative for blurred vision.  Respiratory:  Negative for shortness of breath.   Cardiovascular:  Negative for chest pain, palpitations and leg swelling.  Gastrointestinal:  Negative for abdominal pain, blood in stool and nausea.  Genitourinary:  Negative for dysuria and  frequency.  Musculoskeletal:  Negative for falls and neck pain.  Skin:  Negative for  rash.  Neurological:  Negative for dizziness, loss of consciousness and headaches.  Endo/Heme/Allergies:  Negative for environmental allergies.  Psychiatric/Behavioral:  Positive for memory loss. Negative for depression. The patient is not nervous/anxious.       Objective:    Physical Exam Constitutional:      General: He is not in acute distress.    Appearance: Normal appearance. He is not ill-appearing or toxic-appearing.  HENT:     Head: Normocephalic and atraumatic.     Right Ear: External ear normal.     Left Ear: External ear normal.     Nose: Nose normal.  Eyes:     General:        Right eye: No discharge.        Left eye: No discharge.  Cardiovascular:     Rate and Rhythm: Normal rate and regular rhythm.     Heart sounds: No murmur heard. Pulmonary:     Effort: Pulmonary effort is normal.  Lymphadenopathy:     Cervical: No cervical adenopathy.  Skin:    Findings: No rash.  Neurological:     Mental Status: He is alert and oriented to person, place, and time.  Psychiatric:        Behavior: Behavior normal.    BP (!) 142/72    Pulse 80    Temp 98.3 F (36.8 C)    Resp 16    Ht 5\' 11"  (1.803 m)    Wt 207 lb 6.4 oz (94.1 kg)    SpO2 98%    BMI 28.93 kg/m  Wt Readings from Last 3 Encounters:  07/26/21 207 lb 6.4 oz (94.1 kg)  05/11/21 210 lb (95.3 kg)  03/23/21 210 lb (95.3 kg)    Diabetic Foot Exam - Simple   No data filed    Lab Results  Component Value Date   WBC 9.1 07/13/2021   HGB 14.9 07/13/2021   HCT 43.1 07/13/2021   PLT 305.0 07/13/2021   GLUCOSE 119 (H) 07/13/2021   CHOL 119 07/13/2021   TRIG 125.0 07/13/2021   HDL 30.30 (L) 07/13/2021   LDLDIRECT 74.0 08/03/2017   LDLCALC 64 07/13/2021   ALT 21 07/13/2021   AST 18 07/13/2021   NA 141 07/13/2021   K 3.7 07/13/2021   CL 101 07/13/2021   CREATININE 1.13 07/13/2021   BUN 16 07/13/2021   CO2 31 07/13/2021    TSH 2.94 07/13/2021   PSA 0.61 02/08/2018   HGBA1C 5.5 07/13/2021   MICROALBUR 1.66 03/15/2013    Lab Results  Component Value Date   TSH 2.94 07/13/2021   Lab Results  Component Value Date   WBC 9.1 07/13/2021   HGB 14.9 07/13/2021   HCT 43.1 07/13/2021   MCV 88.8 07/13/2021   PLT 305.0 07/13/2021   Lab Results  Component Value Date   NA 141 07/13/2021   K 3.7 07/13/2021   CO2 31 07/13/2021   GLUCOSE 119 (H) 07/13/2021   BUN 16 07/13/2021   CREATININE 1.13 07/13/2021   BILITOT 0.7 07/13/2021   ALKPHOS 46 07/13/2021   AST 18 07/13/2021   ALT 21 07/13/2021   PROT 6.9 07/13/2021   ALBUMIN 4.3 07/13/2021   CALCIUM 9.2 07/13/2021   GFR 66.28 07/13/2021   Lab Results  Component Value Date   CHOL 119 07/13/2021   Lab Results  Component Value Date   HDL 30.30 (L) 07/13/2021   Lab Results  Component Value Date   LDLCALC 64 07/13/2021   Lab  Results  Component Value Date   TRIG 125.0 07/13/2021   Lab Results  Component Value Date   CHOLHDL 4 07/13/2021   Lab Results  Component Value Date   HGBA1C 5.5 07/13/2021       Assessment & Plan:   Problem List Items Addressed This Visit     Hyperlipidemia, mixed    Tolerating statin, encouraged heart healthy diet, avoid trans fats, minimize simple carbs and saturated fats. Increase exercise as tolerated      Relevant Orders   CBC with Differential/Platelet   Comprehensive metabolic panel   TSH   Lipid panel   Essential hypertension    Well controlled, no changes to meds. Encouraged heart healthy diet such as the DASH diet and exercise as tolerated.       Relevant Orders   CBC with Differential/Platelet   Comprehensive metabolic panel   TSH   Lipid panel   Diabetes mellitus type 2 in obese ([facility])    hgba1c acceptable, minimize simple carbs. Increase exercise as tolerated. Continue current meds      Relevant Orders   Hemoglobin A1c   Myalgia    Hydrate and monitor      Gout    No recent flare.        Memory loss    Notes he is loosing things and forgetting names more. Offered referral to neuropsychology declines referral for now but if it worsens he will let us know      Peripheral edema    I am having Evan Best maintain his Calcium-Magnesium-Zinc, Chelated Potassium, Cinnamon, Omega-3 Fatty Acids (FISH OIL PO), vitamin C, Co Q-10, cetirizine, Quercetin, Cyanocobalamin (VITAMIN B 12 PO), Glucosamine-Chondroitin-Vit D3, TURMERIC PO, Misc Natural Products (TART CHERRY ADVANCED PO), Cholecalciferol (D3 ADULT PO), Melatonin, glucose blood, ferrous sulfate, OMEPRAZOLE PO, famotidine, triamcinolone cream, metFORMIN, lisinopril, amLODipine, and simvastatin.  No orders of the defined types were placed in this encounter.    Penni Homans, MD

## 2021-07-26 NOTE — Assessment & Plan Note (Signed)
Notes he is loosing things and forgetting names more. Offered referral to neuropsychology declines referral for now but if it worsens he will let us know

## 2021-08-23 ENCOUNTER — Encounter: Payer: Self-pay | Admitting: Podiatry

## 2021-08-23 ENCOUNTER — Other Ambulatory Visit: Payer: Self-pay

## 2021-08-23 ENCOUNTER — Ambulatory Visit (INDEPENDENT_AMBULATORY_CARE_PROVIDER_SITE_OTHER): Payer: Medicare Other | Admitting: Podiatry

## 2021-08-23 DIAGNOSIS — M79674 Pain in right toe(s): Secondary | ICD-10-CM | POA: Diagnosis not present

## 2021-08-23 DIAGNOSIS — E119 Type 2 diabetes mellitus without complications: Secondary | ICD-10-CM

## 2021-08-23 DIAGNOSIS — L84 Corns and callosities: Secondary | ICD-10-CM | POA: Diagnosis not present

## 2021-08-23 DIAGNOSIS — I8311 Varicose veins of right lower extremity with inflammation: Secondary | ICD-10-CM

## 2021-08-23 DIAGNOSIS — B351 Tinea unguium: Secondary | ICD-10-CM | POA: Diagnosis not present

## 2021-08-23 DIAGNOSIS — M79675 Pain in left toe(s): Secondary | ICD-10-CM

## 2021-08-29 NOTE — Progress Notes (Signed)
ANNUAL DIABETIC FOOT EXAM  Subjective: Evan Best presents today for for annual diabetic foot examination and callus(es) right hallux and painful thick toenails that are difficult to trim. Painful toenails interfere with ambulation. Aggravating factors include wearing enclosed shoe gear. Pain is relieved with periodic professional debridement. Painful calluses are aggravated when weightbearing with and without shoegear. Pain is relieved with periodic professional debridement..  Patient relates 10 year h/o diabetes.  Patient denies any h/o foot wounds.  Patient does experience numbness, tingling, burning, or pins/needle sensation in feet.  Risk factors: diabetes, neuropathy, hyperlipidemia, HTN.  Evan Lukes, MD is patient's PCP. Last visit was July 26, 2021.  Past Medical History:  Diagnosis Date   Allergic state 09/22/2013   Anemia 09/07/2015   Arthritis 03/06/2015   Diabetes mellitus type II 1/09   Dysphagia 05/04/2017   Esophageal reflux 03/06/2015   Gout 09/18/2012   Left big toe   Hypertension    Insomnia 09/13/2015   Migraine headache    remote history   OSA (obstructive sleep apnea)    Preventative health care 09/05/2014   Right shoulder pain 02/19/2009   Qualifier: Diagnosis of  By: Wynona Luna    RLS (restless legs syndrome) 09/07/2015   Sleep apnea    uses cpap   Tremor 09/07/2015   Patient Active Problem List   Diagnosis Date Noted   Memory loss 07/26/2021   Peripheral edema 07/26/2021   Benign neoplasm of scalp and skin of neck 05/23/2021   Hemangioma of skin and subcutaneous tissue 05/23/2021   Lentigo 05/23/2021   Melanocytic nevi of trunk 05/23/2021   Neoplasm of uncertain behavior of skin 05/23/2021   Peripheral venous insufficiency 05/23/2021   Varicose veins of right lower extremity with inflammation 05/23/2021   Disequilibrium 03/24/2021   Peripheral neuropathy 03/23/2021   Hearing loss 03/23/2021   Pedal edema 09/02/2020   Pain of left  calf 04/28/2020   Depression 04/28/2020   Sixth nerve palsy, 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   Cherry angioma 02/14/2018   Dysphagia 05/04/2017   Sun-damaged skin 01/11/2017   Olecranon bursitis of left elbow 11/19/2016   Insomnia 09/13/2015   Anemia 09/07/2015   Tremor 09/07/2015   RLS (restless legs syndrome) 09/07/2015   Arthritis 03/06/2015   Esophageal reflux 03/06/2015   Preventative health care 09/05/2014   Pain in joint, ankle and foot 04/06/2014   Allergies 09/22/2013   Gout 09/18/2012   Myalgia 12/22/2010   ACTINIC KERATOSIS 06/17/2010   MUSCLE CRAMPS 06/17/2010   Right shoulder pain 02/19/2009   COUGH 09/02/2008   CHRONIC RHINITIS 05/16/2008   PARESTHESIA 05/02/2008   Hyperlipidemia, mixed 08/15/2007   Obstructive sleep apnea 07/31/2007   Essential hypertension 07/31/2007   Diabetes mellitus type 2 in obese ([place]) 07/31/2007   Past Surgical History:  Procedure Laterality Date   HERNIA REPAIR  1610   umbilical hernia repair   KNEE SURGERY     left knee surgery   TONSILLECTOMY  1959   VASECTOMY     Current Outpatient Medications on File Prior to Visit  Medication Sig Dispense Refill   amLODipine (NORVASC) 10 MG tablet TAKE 1 TABLET BY MOUTH EVERY DAY 90 tablet 1   Calcium-Magnesium-Zinc 1000-400-15 MG TABS Take by mouth daily.     cetirizine (ZYRTEC) 10 MG tablet Take 10 mg by mouth daily.     Chelated Potassium 99 MG TABS Take 99 mg by mouth 2 (two) times daily.     Cholecalciferol (D3 ADULT PO) Take by  mouth.     Cinnamon 500 MG capsule Take 500 mg 3 (three) times daily by mouth. +50 mg Chromium in each supplement.     Coenzyme Q10 (CO Q-10) 100 MG CAPS Take 200 mg by mouth daily.     Cyanocobalamin (VITAMIN B 12 PO) Take 1 capsule daily by mouth.     famotidine (PEPCID) 40 MG tablet TAKE 1 TABLET BY MOUTH EVERY DAY 90 tablet 1   ferrous sulfate 325 (65 FE) MG tablet Take 1 tablet (325 mg total) by mouth every other day. 90 tablet 3   Glucosamine-Chondroitin-Vit D3  1500-1200-800 MG-MG-UNIT PACK Take by mouth.     glucose blood test strip One touch Ultra blue -Check sugars daily-  DX E11.69 100 each 11   lisinopril (ZESTRIL) 40 MG tablet TAKE 1 TABLET BY MOUTH EVERY DAY 90 tablet 1   Melatonin 10 MG TABS Take by mouth.     metFORMIN (GLUCOPHAGE) 500 MG tablet TAKE 2 TABLETS BY MOUTH TWICE A DAY WITH A MEAL 360 tablet 1   Misc Natural Products (TART CHERRY ADVANCED PO) Take by mouth.     Omega-3 Fatty Acids (FISH OIL PO) Take 1,500 mg by mouth daily.     OMEPRAZOLE PO      Quercetin 250 MG TABS Take by mouth 2 (two) times daily.      simvastatin (ZOCOR) 10 MG tablet TAKE 1 TABLET BY MOUTH EVERYDAY AT BEDTIME 90 tablet 1   triamcinolone cream (KENALOG) 0.1 % APPLY TO AFFECTED AREA TWICE A DAY FOR 14 DAYS     TURMERIC PO Take by mouth.     vitamin C (ASCORBIC ACID) 500 MG tablet Take 500 mg by mouth 3 (three) times daily.     No current facility-administered medications on file prior to visit.    No Known Allergies Social History   Occupational History   Not on file  Tobacco Use   Smoking status: Former    Years: 5.00    Types: Cigarettes    Quit date: 2005    Years since quitting: 18.1   Smokeless tobacco: Never   Tobacco comments:    1 pack per year  Substance and Sexual Activity   Alcohol use: No   Drug use: No   Sexual activity: Yes   Family History  Problem Relation Age of Onset   Breast cancer Mother    Hypertension Mother    Hyperlipidemia Father    Kidney disease Father    Heart disease Father    Hypertension Father    Colon cancer Neg Hx    Stomach cancer Neg Hx    Immunization History  Administered Date(s) Administered   Fluad Quad(high Dose 65+) 04/24/2019, 06/01/2020   Influenza Split 05/13/2011, 05/11/2012   Influenza Whole 05/13/2009, 05/14/2010, 04/10/2013   Influenza, High Dose Seasonal PF 05/04/2017   Influenza-Unspecified 04/11/2014, 04/17/2015, 04/10/2018   PFIZER(Purple Top)SARS-COV-2 Vaccination 10/18/2019,  11/12/2019   Pneumococcal Conjugate-13 05/04/2017   Pneumococcal Polysaccharide-23 05/24/2018   Tdap 06/21/2011   Zoster Recombinat (Shingrix) 02/15/2018, 07/09/2018     Review of Systems: Negative except as noted in the HPI.   Objective: There were no vitals filed for this visit.  Evan Best is a pleasant 71 y.o. male in NAD. AAO X 3.  Vascular Examination: Capillary refill time to digits immediate b/l. Palpable pedal pulses b/l LE. Pedal hair sparse. No pain with calf compression b/l. Lower extremity skin temperature gradient within normal limits. Nonpitting edema noted bilateral ankles. Varicosities  present b/l. Evidence of chronic venous insufficiency b/l LE. No cyanosis or clubbing noted b/l LE.  Dermatological Examination: Pedal skin warm and supple b/l.  No open wounds b/l. No interdigital macerations. Toenails 1-5 b/l elongated, thickened, discolored with subungual debris. +Tenderness with dorsal palpation of nailplates. Hyperkeratotic lesion(s) noted R hallux.   Musculoskeletal Examination: Muscle strength 5/5 to all lower extremity muscle groups bilaterally. No pain, crepitus or joint limitation noted with ROM bilateral LE. No gross bony deformities bilaterally. Patient ambulates independent of any assistive aids.  Footwear Assessment: Does the patient wear appropriate shoes? Yes. Does the patient need inserts/orthotics? No.  Neurological Examination: Protective sensation intact 5/5 intact bilaterally with 10g monofilament b/l.  Hemoglobin A1C Latest Ref Rng & Units 07/13/2021 03/19/2021 11/26/2020  HGBA1C 4.6 - 6.5 % 5.5 5.9 5.9  Some recent data might be hidden   Assessment: 1. Pain due to onychomycosis of toenails of both feet   2. Callus   3. Controlled type 2 diabetes mellitus without complication, without long-term current use of insulin ([place])   4. Varicose veins of right lower extremity with inflammation   5. Encounter for diabetic foot exam ([place])     ADA  Risk Categorization: Low Risk :  Patient has all of the following: Intact protective sensation No prior foot ulcer  No severe deformity Pedal pulses present  Plan: -Diabetic foot examination performed today. -Continue foot and shoe inspections daily. Monitor blood glucose per PCP/Endocrinologist's recommendations. -Mycotic toenails 1-5 bilaterally were debrided in length and girth with sterile nail nippers and dremel without incident. -Patient/POA to call should there be question/concern in the interim.  Return in about 3 months (around 11/20/2021).  Marzetta Board, DPM

## 2021-10-07 ENCOUNTER — Ambulatory Visit (INDEPENDENT_AMBULATORY_CARE_PROVIDER_SITE_OTHER): Payer: Medicare Other | Admitting: Internal Medicine

## 2021-10-07 ENCOUNTER — Encounter: Payer: Self-pay | Admitting: Internal Medicine

## 2021-10-07 DIAGNOSIS — G4733 Obstructive sleep apnea (adult) (pediatric): Secondary | ICD-10-CM | POA: Diagnosis not present

## 2021-10-07 DIAGNOSIS — K219 Gastro-esophageal reflux disease without esophagitis: Secondary | ICD-10-CM | POA: Diagnosis not present

## 2021-10-07 NOTE — Patient Instructions (Addendum)
Order- DME [facility]-   continue auto 5-15. Please replace mask of choice, humidifier, supplies/ hoses/ filter ? ?Please call if we can help ?

## 2021-10-07 NOTE — Progress Notes (Signed)
HPI ?male never smoker for sleep evaluation.  Previously followed for OSA, restless legs, chronic rhinitis, complicated by HBP, DM 2, GERD, HBP, gout, ?NPSG 11/26/06-AHI 56/hour, desaturation to 86%, body weight 225 pounds ? ?---------------------------------------------------------------------------------------- ? ?10/05/20- 71 year old male never smoker followed for OSA, restless legs, chronic rhinitis, complicated by HBP, DM 2, GERD, gout, chronic headaches, Hyperlipidemia,  ?CPAP auto 5-15/Adapt ?Download- compliance 93%, AHI 0.6/ hr ?Body weight today-210 lbs ?Covid vax-2 Phizer ?Flu vax-had ?Doing well with CPAP. Sleeps on sides preferentially.  ?Denies allergic rhinitis so far this Spring. ? ?10/07/21- 71 year old male former light smoker followed for OSA, restless legs, chronic rhinitis, complicated by HBP, DM 2, GERD, gout, chronic Headaches, Hyperlipidemia,  ?CPAP auto 5-15/Adapt ?Download- 97%, AHI 0.3/ hr ?Body weight today-210 lbs ?Covid vax-2 Phizer ?Flu vax-no ?Note reviewed.  He is very comfortable with his CPAP and says it does well.  Headgear is worn out and needs replacement. ?He denies new health problems or concerns. ? ?ROS-see HPI   + = positive ?Constitutional:   No-   weight loss, night sweats, fevers, chills, fatigue, lassitude. ?HEENT:   No-  headaches, difficulty swallowing, tooth/dental problems, sore throat,  ?     No-  sneezing, itching, ear ache, nasal congestion, post nasal drip,  ?CV:  No-   chest pain, orthopnea, PND, swelling in lower extremities, anasarca, dizziness, palpitations ?Resp: No-   shortness of breath with exertion or at rest.   ?           No-   productive cough,   non-productive cough,  No- coughing up of blood.   ?           No-   change in color of mucus.  No- wheezing.   ?Skin: No-   rash or lesions. ?GI:  No-   heartburn, indigestion, abdominal pain, nausea, vomiting,  ?GU: . ?MS:  No-   joint pain or swelling.   ?Neuro-     nothing unusual ?Psych:  No- change in  mood or affect. No depression or anxiety.  No memory loss. ? ?OBJ ?General- Alert, Oriented, Affect-appropriate, Distress- none acute,  ?Skin- rash-none, lesions- none, excoriation- none ?Lymphadenopathy- none ?Head- atraumatic ?           Eyes- Gross vision intact, PERRLA, conjunctivae clear secretions ?           Ears- Hearing, canals-normal ?           Nose- Clear, +narrower on right, +mucus bridging, polyps, erosion, perforation  ?           Throat- Mallampati III , mucosa  , drainage- none, tonsils- atrophic ?Neck- flexible , trachea midline, no stridor , thyroid nl, carotid no bruit ?Chest - symmetrical excursion , unlabored ?          Heart/CV- RRR, no murmur , no gallop  , no rub, nl s1 s2 ?                          - JVD- none , edema- none, stasis changes- none, varices- none ?          Lung- clear to P&A, wheeze- none, cough- none , dullness-none, rub- none ?          Chest wall-  ?Abd-  ?Br/ Gen/ Rectal- Not done, not indicated ?Extrem- cyanosis- none, clubbing, none, atrophy- none, strength- nl ?Neuro- grossly intact to observation ? ? ? ? ?

## 2021-10-14 ENCOUNTER — Other Ambulatory Visit: Payer: Self-pay | Admitting: Family Medicine

## 2021-10-28 DIAGNOSIS — H43391 Other vitreous opacities, right eye: Secondary | ICD-10-CM | POA: Diagnosis not present

## 2021-11-03 ENCOUNTER — Encounter: Payer: Self-pay | Admitting: Internal Medicine

## 2021-11-03 NOTE — Assessment & Plan Note (Signed)
Benefits from CPAP with good compliance and control ?Plan-continue auto 5-15.  Replace worn headgear, supplies ?

## 2021-11-03 NOTE — Assessment & Plan Note (Signed)
Not experiencing significant reflux discomfort at night. ?Plan-continue reflux precautions ?

## 2021-11-19 ENCOUNTER — Other Ambulatory Visit (INDEPENDENT_AMBULATORY_CARE_PROVIDER_SITE_OTHER): Payer: Medicare Other

## 2021-11-19 DIAGNOSIS — E669 Obesity, unspecified: Secondary | ICD-10-CM

## 2021-11-19 DIAGNOSIS — E782 Mixed hyperlipidemia: Secondary | ICD-10-CM

## 2021-11-19 DIAGNOSIS — I10 Essential (primary) hypertension: Secondary | ICD-10-CM | POA: Diagnosis not present

## 2021-11-19 DIAGNOSIS — E1169 Type 2 diabetes mellitus with other specified complication: Secondary | ICD-10-CM | POA: Diagnosis not present

## 2021-11-19 LAB — LIPID PANEL
Cholesterol: 112 mg/dL (ref 0–200)
HDL: 31.3 mg/dL — ABNORMAL LOW (ref >39.00)
LDL Cholesterol: 67 mg/dL (ref 0–99)
NonHDL: 81.16
Total CHOL/HDL Ratio: 4
Triglycerides: 73 mg/dL (ref 0.0–149.0)
VLDL: 14.6 mg/dL (ref 0.0–40.0)

## 2021-11-19 LAB — COMPREHENSIVE METABOLIC PANEL
ALT: 21 U/L (ref 0–53)
AST: 18 U/L (ref 0–37)
Albumin: 4.2 g/dL (ref 3.5–5.2)
Alkaline Phosphatase: 44 U/L (ref 39–117)
BUN: 14 mg/dL (ref 6–23)
CO2: 28 mEq/L (ref 19–32)
Calcium: 8.9 mg/dL (ref 8.4–10.5)
Chloride: 104 mEq/L (ref 96–112)
Creatinine, Ser: 0.95 mg/dL (ref 0.40–1.50)
GFR: 81.41 mL/min (ref >60.00)
Glucose, Bld: 169 mg/dL — ABNORMAL HIGH (ref 70–99)
Potassium: 3.8 mEq/L (ref 3.5–5.1)
Sodium: 139 mEq/L (ref 135–145)
Total Bilirubin: 0.8 mg/dL (ref 0.2–1.2)
Total Protein: 6.8 g/dL (ref 6.0–8.3)

## 2021-11-19 LAB — CBC WITH DIFFERENTIAL/PLATELET
Basophils Absolute: 0.1 10*3/uL (ref 0.0–0.1)
Basophils Relative: 0.9 % (ref 0.0–3.0)
Eosinophils Absolute: 0.4 10*3/uL (ref 0.0–0.7)
Eosinophils Relative: 4.5 % (ref 0.0–5.0)
HCT: 42 % (ref 39.0–52.0)
Hemoglobin: 14.8 g/dL (ref 13.0–17.0)
Lymphocytes Relative: 17.8 % (ref 12.0–46.0)
Lymphs Abs: 1.6 10*3/uL (ref 0.7–4.0)
MCHC: 35.4 g/dL (ref 30.0–36.0)
MCV: 87.4 fl (ref 78.0–100.0)
Monocytes Absolute: 1 10*3/uL (ref 0.1–1.0)
Monocytes Relative: 11.2 % (ref 3.0–12.0)
Neutro Abs: 5.9 10*3/uL (ref 1.4–7.7)
Neutrophils Relative %: 65.6 % (ref 43.0–77.0)
Platelets: 309 10*3/uL (ref 150.0–400.0)
RBC: 4.81 Mil/uL (ref 4.22–5.81)
RDW: 13.2 % (ref 11.5–15.5)
WBC: 8.9 10*3/uL (ref 4.0–10.5)

## 2021-11-19 LAB — TSH: TSH: 1.13 u[IU]/mL (ref 0.35–5.50)

## 2021-11-19 LAB — HEMOGLOBIN A1C: Hgb A1c MFr Bld: 5.6 % (ref 4.6–6.5)

## 2021-11-22 ENCOUNTER — Ambulatory Visit (INDEPENDENT_AMBULATORY_CARE_PROVIDER_SITE_OTHER): Payer: Medicare Other | Admitting: Podiatry

## 2021-11-22 DIAGNOSIS — L84 Corns and callosities: Secondary | ICD-10-CM

## 2021-11-22 DIAGNOSIS — E119 Type 2 diabetes mellitus without complications: Secondary | ICD-10-CM

## 2021-11-22 DIAGNOSIS — M79675 Pain in left toe(s): Secondary | ICD-10-CM

## 2021-11-22 DIAGNOSIS — B351 Tinea unguium: Secondary | ICD-10-CM

## 2021-11-22 DIAGNOSIS — M79674 Pain in right toe(s): Secondary | ICD-10-CM

## 2021-11-22 NOTE — Progress Notes (Deleted)
Subjective:    Patient ID: Evan Best, male    DOB: July 09, 1951, 71 y.o.   MRN: [id number]  No chief complaint on file.   HPI Patient is in today for a follow up.  Past Medical History:  Diagnosis Date   Allergic state 09/22/2013   Anemia 09/07/2015   Arthritis 03/06/2015   Diabetes mellitus type II 1/09   Dysphagia 05/04/2017   Esophageal reflux 03/06/2015   Gout 09/18/2012   Left big toe   Hypertension    Insomnia 09/13/2015   Migraine headache    remote history   OSA (obstructive sleep apnea)    Preventative health care 09/05/2014   Right shoulder pain 02/19/2009   Qualifier: Diagnosis of  By: Wynona Luna    RLS (restless legs syndrome) 09/07/2015   Sleep apnea    uses cpap   Tremor 09/07/2015    Past Surgical History:  Procedure Laterality Date   HERNIA REPAIR  0488   umbilical hernia repair   KNEE SURGERY     left knee surgery   TONSILLECTOMY  1959   VASECTOMY      Family History  Problem Relation Age of Onset   Breast cancer Mother    Hypertension Mother    Hyperlipidemia Father    Kidney disease Father    Heart disease Father    Hypertension Father    Colon cancer Neg Hx    Stomach cancer Neg Hx     Social History   Socioeconomic History   Marital status: Married    Spouse name: Not on file   Number of children: 1   Years of education: Not on file   Highest education level: Not on file  Occupational History   Not on file  Tobacco Use   Smoking status: Former    Years: 5.00    Types: Cigarettes    Quit date: 2005    Years since quitting: 18.3   Smokeless tobacco: Never   Tobacco comments:    1 pack per year  Vaping Use   Vaping Use: Never used  Substance and Sexual Activity   Alcohol use: No   Drug use: No   Sexual activity: Yes  Other Topics Concern   Not on file  Social History Narrative   Occupation:  Chemical engineer   Married  -  1 daughter (age 77)    Never Smoked   Alcohol use-no     Drug use-no           Social Determinants of Health   Financial Resource Strain: Low Risk    Difficulty of Paying Living Expenses: Not hard at all  Food Insecurity: No Food Insecurity   Worried About Charity fundraiser in the Last Year: Never true   Arboriculturist in the Last Year: Never true  Transportation Needs: No Transportation Needs   Lack of Transportation (Medical): No   Lack of Transportation (Non-Medical): No  Physical Activity: Inactive   Days of Exercise per Week: 0 days   Minutes of Exercise per Session: 0 min  Stress: No Stress Concern Present   Feeling of Stress : Not at all  Social Connections: Socially Isolated   Frequency of Communication with Friends and Family: Once a week   Frequency of Social Gatherings with Friends and Family: Once a week   Attends Religious Services: Never   Marine scientist or Organizations: No   Attends Archivist Meetings: Never  Marital Status: Married  Human resources officer Violence: Not At Risk   Fear of Current or Ex-Partner: No   Emotionally Abused: No   Physically Abused: No   Sexually Abused: No    Outpatient Medications Prior to Visit  Medication Sig Dispense Refill   amLODipine (NORVASC) 10 MG tablet TAKE 1 TABLET BY MOUTH EVERY DAY 90 tablet 1   Calcium-Magnesium-Zinc 1000-400-15 MG TABS Take by mouth daily.     cetirizine (ZYRTEC) 10 MG tablet Take 10 mg by mouth daily.     Chelated Potassium 99 MG TABS Take 99 mg by mouth 2 (two) times daily.     Cholecalciferol (D3 ADULT PO) Take by mouth.     Cinnamon 500 MG capsule Take 500 mg 3 (three) times daily by mouth. +50 mg Chromium in each supplement.     Coenzyme Q10 (CO Q-10) 100 MG CAPS Take 200 mg by mouth daily.     Cyanocobalamin (VITAMIN B 12 PO) Take 1 capsule daily by mouth.     famotidine (PEPCID) 40 MG tablet TAKE 1 TABLET BY MOUTH EVERY DAY 90 tablet 1   ferrous sulfate 325 (65 FE) MG tablet Take 1 tablet (325 mg total) by mouth every other day. 90 tablet 3    Glucosamine-Chondroitin-Vit D3 1500-1200-800 MG-MG-UNIT PACK Take by mouth.     glucose blood test strip One touch Ultra blue -Check sugars daily-  DX E11.69 100 each 11   lisinopril (ZESTRIL) 40 MG tablet TAKE 1 TABLET BY MOUTH EVERY DAY 90 tablet 1   Melatonin 10 MG TABS Take by mouth.     metFORMIN (GLUCOPHAGE) 500 MG tablet TAKE 2 TABLETS BY MOUTH TWICE A DAY WITH A MEAL 360 tablet 1   Misc Natural Products (TART CHERRY ADVANCED PO) Take by mouth.     Omega-3 Fatty Acids (FISH OIL PO) Take 1,500 mg by mouth daily.     OMEPRAZOLE PO      Quercetin 250 MG TABS Take by mouth 2 (two) times daily.      simvastatin (ZOCOR) 10 MG tablet TAKE 1 TABLET BY MOUTH EVERYDAY AT BEDTIME 90 tablet 1   triamcinolone cream (KENALOG) 0.1 % APPLY TO AFFECTED AREA TWICE A DAY FOR 14 DAYS     TURMERIC PO Take by mouth.     vitamin C (ASCORBIC ACID) 500 MG tablet Take 500 mg by mouth 3 (three) times daily.     No facility-administered medications prior to visit.    No Known Allergies  ROS     Objective:    Physical Exam  There were no vitals taken for this visit. Wt Readings from Last 3 Encounters:  10/07/21 210 lb 3.2 oz (95.3 kg)  07/26/21 207 lb 6.4 oz (94.1 kg)  05/11/21 210 lb (95.3 kg)    Diabetic Foot Exam - Simple   No data filed    Lab Results  Component Value Date   WBC 8.9 11/19/2021   HGB 14.8 11/19/2021   HCT 42.0 11/19/2021   PLT 309.0 11/19/2021   GLUCOSE 169 (H) 11/19/2021   CHOL 112 11/19/2021   TRIG 73.0 11/19/2021   HDL 31.30 (L) 11/19/2021   LDLDIRECT 74.0 08/03/2017   LDLCALC 67 11/19/2021   ALT 21 11/19/2021   AST 18 11/19/2021   NA 139 11/19/2021   K 3.8 11/19/2021   CL 104 11/19/2021   CREATININE 0.95 11/19/2021   BUN 14 11/19/2021   CO2 28 11/19/2021   TSH 1.13 11/19/2021   PSA 0.61  02/08/2018   HGBA1C 5.6 11/19/2021   MICROALBUR 1.66 03/15/2013    Lab Results  Component Value Date   TSH 1.13 11/19/2021   Lab Results  Component Value Date    WBC 8.9 11/19/2021   HGB 14.8 11/19/2021   HCT 42.0 11/19/2021   MCV 87.4 11/19/2021   PLT 309.0 11/19/2021   Lab Results  Component Value Date   NA 139 11/19/2021   K 3.8 11/19/2021   CO2 28 11/19/2021   GLUCOSE 169 (H) 11/19/2021   BUN 14 11/19/2021   CREATININE 0.95 11/19/2021   BILITOT 0.8 11/19/2021   ALKPHOS 44 11/19/2021   AST 18 11/19/2021   ALT 21 11/19/2021   PROT 6.8 11/19/2021   ALBUMIN 4.2 11/19/2021   CALCIUM 8.9 11/19/2021   GFR 81.41 11/19/2021   Lab Results  Component Value Date   CHOL 112 11/19/2021   Lab Results  Component Value Date   HDL 31.30 (L) 11/19/2021   Lab Results  Component Value Date   LDLCALC 67 11/19/2021   Lab Results  Component Value Date   TRIG 73.0 11/19/2021   Lab Results  Component Value Date   CHOLHDL 4 11/19/2021   Lab Results  Component Value Date   HGBA1C 5.6 11/19/2021       Assessment & Plan:   Problem List Items Addressed This Visit   None   I am having Evan Best maintain his Calcium-Magnesium-Zinc, Chelated Potassium, Cinnamon, Omega-3 Fatty Acids (FISH OIL PO), vitamin C, Co Q-10, cetirizine, Quercetin, Cyanocobalamin (VITAMIN B 12 PO), Glucosamine-Chondroitin-Vit D3, TURMERIC PO, Misc Natural Products (TART CHERRY ADVANCED PO), Cholecalciferol (D3 ADULT PO), Melatonin, glucose blood, ferrous sulfate, OMEPRAZOLE PO, triamcinolone cream, metFORMIN, lisinopril, amLODipine, simvastatin, and famotidine.  No orders of the defined types were placed in this encounter.

## 2021-11-23 ENCOUNTER — Ambulatory Visit: Payer: Medicare Other | Admitting: Family Medicine

## 2021-11-23 DIAGNOSIS — I10 Essential (primary) hypertension: Secondary | ICD-10-CM

## 2021-11-23 DIAGNOSIS — E782 Mixed hyperlipidemia: Secondary | ICD-10-CM

## 2021-11-23 DIAGNOSIS — F32A Depression, unspecified: Secondary | ICD-10-CM

## 2021-11-23 DIAGNOSIS — E669 Obesity, unspecified: Secondary | ICD-10-CM

## 2021-11-23 DIAGNOSIS — G4733 Obstructive sleep apnea (adult) (pediatric): Secondary | ICD-10-CM

## 2021-11-28 ENCOUNTER — Encounter: Payer: Self-pay | Admitting: Podiatry

## 2021-11-28 ENCOUNTER — Other Ambulatory Visit: Payer: Self-pay | Admitting: Family Medicine

## 2021-11-28 NOTE — Progress Notes (Signed)
  Subjective:  Patient ID: Evan Best, male    DOB: 10/17/1950,  MRN: [id number]  Evan Best presents to clinic today for preventative diabetic foot care and callus(es) right lower extremity and painful thick toenails that are difficult to trim. Painful toenails interfere with ambulation. Aggravating factors include wearing enclosed shoe gear. Pain is relieved with periodic professional debridement. Painful calluses are aggravated when weightbearing with and without shoegear. Pain is relieved with periodic professional debridement.  Patient did not check blood glucose today.  Patient does not monitor blood glucose daily. Patient is not required to monitor blood glucose daily.  New problem(s): None.   PCP is [name], MD , and last visit was July 26, 2021.  Evan Best states his wife has been placed in hospice at [facility].  No Known Allergies  Review of Systems: Negative except as noted in the HPI.  Objective: No changes noted in today's physical examination. There were no vitals filed for this visit.  Evan Best is a pleasant 71 y.o. male in NAD. AAO X 3.  Vascular Examination: Capillary refill time to digits immediate b/l. Palpable pedal pulses b/l LE. Pedal hair sparse. No pain with calf compression b/l. Lower extremity skin temperature gradient within normal limits. Nonpitting edema noted bilateral ankles. Varicosities present b/l. Evidence of chronic venous insufficiency b/l LE. No cyanosis or clubbing noted b/l LE.  Dermatological Examination: Pedal skin warm and supple b/l.  No open wounds b/l. No interdigital macerations. Toenails 1-5 b/l elongated, thickened, discolored with subungual debris. +Tenderness with dorsal palpation of nailplates. Hyperkeratotic lesion(s) noted R hallux.   Musculoskeletal Examination: Muscle strength 5/5 to all lower extremity muscle groups bilaterally. No pain, crepitus or joint limitation noted with ROM bilateral LE.  No gross bony deformities bilaterally. Patient ambulates independent of any assistive aids.  Neurological Examination: Protective sensation intact 5/5 intact bilaterally with 10g monofilament b/l.     Latest Ref Rng & Units 11/19/2021    3:33 PM 07/13/2021    9:59 AM 03/19/2021   10:21 AM  Hemoglobin A1C  Hemoglobin-A1c 4.6 - 6.5 % 5.6   5.5   5.9     Assessment/Plan: 1. Pain due to onychomycosis of toenails of both feet   2. Callus   3. Controlled type 2 diabetes mellitus without complication, without long-term current use of insulin (Evan Best)     -Patient was evaluated and treated. All patient's and/or POA's questions/concerns answered on today's visit. -Informed Evan Best I will be keeping him and his family in my prayers. -Patient to continue soft, supportive shoe gear daily. -Toenails 1-5 b/l were debrided in length and girth with sterile nail nippers and dremel without iatrogenic bleeding.  -Callus(es) R hallux pared utilizing sterile scalpel blade without complication or incident. Total number debrided =1. -Patient/POA to call should there be question/concern in the interim.   Return in about 3 months (around 02/22/2022).  Marzetta Board, DPM

## 2021-12-08 DIAGNOSIS — H04123 Dry eye syndrome of bilateral lacrimal glands: Secondary | ICD-10-CM | POA: Diagnosis not present

## 2021-12-08 DIAGNOSIS — H25813 Combined forms of age-related cataract, bilateral: Secondary | ICD-10-CM | POA: Diagnosis not present

## 2021-12-08 DIAGNOSIS — H43391 Other vitreous opacities, right eye: Secondary | ICD-10-CM | POA: Diagnosis not present

## 2021-12-08 DIAGNOSIS — E119 Type 2 diabetes mellitus without complications: Secondary | ICD-10-CM | POA: Diagnosis not present

## 2021-12-08 DIAGNOSIS — H353131 Nonexudative age-related macular degeneration, bilateral, early dry stage: Secondary | ICD-10-CM | POA: Diagnosis not present

## 2021-12-08 LAB — HM DIABETES EYE EXAM

## 2022-01-13 ENCOUNTER — Other Ambulatory Visit: Payer: Self-pay | Admitting: Family Medicine

## 2022-01-19 ENCOUNTER — Other Ambulatory Visit: Payer: Medicare Other

## 2022-01-21 NOTE — Progress Notes (Unsigned)
Subjective:    Patient ID: Evan Best, male    DOB: 08-19-1950, 71 y.o.   MRN: [id number]  No chief complaint on file.   HPI Patient is in today for follow up.  Past Medical History:  Diagnosis Date   Allergic state 09/22/2013   Anemia 09/07/2015   Arthritis 03/06/2015   Diabetes mellitus type II 1/09   Dysphagia 05/04/2017   Esophageal reflux 03/06/2015   Gout 09/18/2012   Left big toe   Hypertension    Insomnia 09/13/2015   Migraine headache    remote history   OSA (obstructive sleep apnea)    Preventative health care 09/05/2014   Right shoulder pain 02/19/2009   Qualifier: Diagnosis of  By: Wynona Luna    RLS (restless legs syndrome) 09/07/2015   Sleep apnea    uses cpap   Tremor 09/07/2015    Past Surgical History:  Procedure Laterality Date   HERNIA REPAIR  9741   umbilical hernia repair   KNEE SURGERY     left knee surgery   TONSILLECTOMY  1959   VASECTOMY      Family History  Problem Relation Age of Onset   Breast cancer Mother    Hypertension Mother    Hyperlipidemia Father    Kidney disease Father    Heart disease Father    Hypertension Father    Colon cancer Neg Hx    Stomach cancer Neg Hx     Social History   Socioeconomic History   Marital status: Married    Spouse name: Not on file   Number of children: 1   Years of education: Not on file   Highest education level: Not on file  Occupational History   Not on file  Tobacco Use   Smoking status: Former    Years: 5.00    Types: Cigarettes    Quit date: 2005    Years since quitting: 18.5   Smokeless tobacco: Never   Tobacco comments:    1 pack per year  Vaping Use   Vaping Use: Never used  Substance and Sexual Activity   Alcohol use: No   Drug use: No   Sexual activity: Yes  Other Topics Concern   Not on file  Social History Narrative   Occupation:  Chemical engineer   Married  -  1 daughter (age 13)    Never Smoked   Alcohol use-no     Drug use-no           Social Determinants of Health   Financial Resource Strain: Low Risk  (05/11/2021)   Overall Financial Resource Strain (CARDIA)    Difficulty of Paying Living Expenses: Not hard at all  Food Insecurity: No Food Insecurity (05/11/2021)   Hunger Vital Sign    Worried About Running Out of Food in the Last Year: Never true    Ran Out of Food in the Last Year: Never true  Transportation Needs: No Transportation Needs (05/11/2021)   PRAPARE - Hydrologist (Medical): No    Lack of Transportation (Non-Medical): No  Physical Activity: Inactive (05/11/2021)   Exercise Vital Sign    Days of Exercise per Week: 0 days    Minutes of Exercise per Session: 0 min  Stress: No Stress Concern Present (05/11/2021)   [place]    Feeling of Stress : Not at all  Social Connections: Socially Isolated (05/11/2021)   Social  Connection and Isolation Panel [NHANES]    Frequency of Communication with Friends and Family: Once a week    Frequency of Social Gatherings with Friends and Family: Once a week    Attends Religious Services: Never    Marine scientist or Organizations: No    Attends Archivist Meetings: Never    Marital Status: Married  Human resources officer Violence: Not At Risk (05/11/2021)   Humiliation, Afraid, Rape, and Kick questionnaire    Fear of Current or Ex-Partner: No    Emotionally Abused: No    Physically Abused: No    Sexually Abused: No    Outpatient Medications Prior to Visit  Medication Sig Dispense Refill   amLODipine (NORVASC) 10 MG tablet TAKE 1 TABLET BY MOUTH EVERY DAY 90 tablet 1   Calcium-Magnesium-Zinc 1000-400-15 MG TABS Take by mouth daily.     cetirizine (ZYRTEC) 10 MG tablet Take 10 mg by mouth daily.     Chelated Potassium 99 MG TABS Take 99 mg by mouth 2 (two) times daily.     Cholecalciferol (D3 ADULT PO) Take by mouth.     Cinnamon 500 MG capsule Take 500 mg 3  (three) times daily by mouth. +50 mg Chromium in each supplement.     Coenzyme Q10 (CO Q-10) 100 MG CAPS Take 200 mg by mouth daily.     Cyanocobalamin (VITAMIN B 12 PO) Take 1 capsule daily by mouth.     famotidine (PEPCID) 40 MG tablet TAKE 1 TABLET BY MOUTH EVERY DAY 90 tablet 1   ferrous sulfate 325 (65 FE) MG tablet Take 1 tablet (325 mg total) by mouth every other day. 90 tablet 3   Glucosamine-Chondroitin-Vit D3 1500-1200-800 MG-MG-UNIT PACK Take by mouth.     glucose blood test strip One touch Ultra blue -Check sugars daily-  DX E11.69 100 each 11   lisinopril (ZESTRIL) 40 MG tablet TAKE 1 TABLET BY MOUTH EVERY DAY 90 tablet 1   Melatonin 10 MG TABS Take by mouth.     metFORMIN (GLUCOPHAGE) 500 MG tablet TAKE 2 TABLETS BY MOUTH TWICE A DAY WITH MEALS 360 tablet 1   Misc Natural Products (TART CHERRY ADVANCED PO) Take by mouth.     Omega-3 Fatty Acids (FISH OIL PO) Take 1,500 mg by mouth daily.     OMEPRAZOLE PO      Quercetin 250 MG TABS Take by mouth 2 (two) times daily.      simvastatin (ZOCOR) 10 MG tablet TAKE 1 TABLET BY MOUTH EVERYDAY AT BEDTIME 90 tablet 1   triamcinolone cream (KENALOG) 0.1 % apply     TURMERIC PO Take by mouth.     vitamin C (ASCORBIC ACID) 500 MG tablet Take 500 mg by mouth 3 (three) times daily.     No facility-administered medications prior to visit.    No Known Allergies  ROS     Objective:    Physical Exam  There were no vitals taken for this visit. Wt Readings from Last 3 Encounters:  10/07/21 210 lb 3.2 oz (95.3 kg)  07/26/21 207 lb 6.4 oz (94.1 kg)  05/11/21 210 lb (95.3 kg)    Diabetic Foot Exam - Simple   No data filed    Lab Results  Component Value Date   WBC 8.9 11/19/2021   HGB 14.8 11/19/2021   HCT 42.0 11/19/2021   PLT 309.0 11/19/2021   GLUCOSE 169 (H) 11/19/2021   CHOL 112 11/19/2021   TRIG 73.0 11/19/2021  HDL 31.30 (L) 11/19/2021   LDLDIRECT 74.0 08/03/2017   LDLCALC 67 11/19/2021   ALT 21 11/19/2021   AST  18 11/19/2021   NA 139 11/19/2021   K 3.8 11/19/2021   CL 104 11/19/2021   CREATININE 0.95 11/19/2021   BUN 14 11/19/2021   CO2 28 11/19/2021   TSH 1.13 11/19/2021   PSA 0.61 02/08/2018   HGBA1C 5.6 11/19/2021   MICROALBUR 1.66 03/15/2013    Lab Results  Component Value Date   TSH 1.13 11/19/2021   Lab Results  Component Value Date   WBC 8.9 11/19/2021   HGB 14.8 11/19/2021   HCT 42.0 11/19/2021   MCV 87.4 11/19/2021   PLT 309.0 11/19/2021   Lab Results  Component Value Date   NA 139 11/19/2021   K 3.8 11/19/2021   CO2 28 11/19/2021   GLUCOSE 169 (H) 11/19/2021   BUN 14 11/19/2021   CREATININE 0.95 11/19/2021   BILITOT 0.8 11/19/2021   ALKPHOS 44 11/19/2021   AST 18 11/19/2021   ALT 21 11/19/2021   PROT 6.8 11/19/2021   ALBUMIN 4.2 11/19/2021   CALCIUM 8.9 11/19/2021   GFR 81.41 11/19/2021   Lab Results  Component Value Date   CHOL 112 11/19/2021   Lab Results  Component Value Date   HDL 31.30 (L) 11/19/2021   Lab Results  Component Value Date   LDLCALC 67 11/19/2021   Lab Results  Component Value Date   TRIG 73.0 11/19/2021   Lab Results  Component Value Date   CHOLHDL 4 11/19/2021   Lab Results  Component Value Date   HGBA1C 5.6 11/19/2021       Assessment & Plan:      Problem List Items Addressed This Visit   None   I am having Evan Best maintain his Calcium-Magnesium-Zinc, Chelated Potassium, Cinnamon, Omega-3 Fatty Acids (FISH OIL PO), vitamin C, Co Q-10, cetirizine, Quercetin, Cyanocobalamin (VITAMIN B 12 PO), Glucosamine-Chondroitin-Vit D3, TURMERIC PO, Misc Natural Products (TART CHERRY ADVANCED PO), Cholecalciferol (D3 ADULT PO), Melatonin, glucose blood, ferrous sulfate, OMEPRAZOLE PO, famotidine, triamcinolone cream, lisinopril, amLODipine, metFORMIN, and simvastatin.  No orders of the defined types were placed in this encounter.

## 2022-01-24 ENCOUNTER — Ambulatory Visit (INDEPENDENT_AMBULATORY_CARE_PROVIDER_SITE_OTHER): Payer: Medicare Other | Admitting: Family Medicine

## 2022-01-24 ENCOUNTER — Encounter: Payer: Self-pay | Admitting: Family Medicine

## 2022-01-24 VITALS — BP 130/88 | HR 79 | Resp 20 | Ht 71.0 in | Wt 200.0 lb

## 2022-01-24 DIAGNOSIS — E1169 Type 2 diabetes mellitus with other specified complication: Secondary | ICD-10-CM | POA: Diagnosis not present

## 2022-01-24 DIAGNOSIS — E782 Mixed hyperlipidemia: Secondary | ICD-10-CM | POA: Diagnosis not present

## 2022-01-24 DIAGNOSIS — M1A9XX0 Chronic gout, unspecified, without tophus (tophi): Secondary | ICD-10-CM | POA: Diagnosis not present

## 2022-01-24 DIAGNOSIS — F32A Depression, unspecified: Secondary | ICD-10-CM

## 2022-01-24 DIAGNOSIS — I10 Essential (primary) hypertension: Secondary | ICD-10-CM

## 2022-01-24 DIAGNOSIS — E669 Obesity, unspecified: Secondary | ICD-10-CM

## 2022-01-24 NOTE — Assessment & Plan Note (Signed)
He lost his wife of many years over the weekend. He is managing for now but will let us know if we need to make changes.

## 2022-01-24 NOTE — Assessment & Plan Note (Signed)
Well controlled, no changes to meds. Encouraged heart healthy diet such as the DASH diet and exercise as tolerated.  °

## 2022-01-24 NOTE — Assessment & Plan Note (Signed)
hgba1c acceptable, minimize simple carbs. Increase exercise as tolerated. Continue current meds 

## 2022-01-24 NOTE — Patient Instructions (Addendum)
Try topical creams with menthol such as Vick's vapor rub, biofreeze or CBD daily extra strength cream, mint  Peripheral Neuropathy Peripheral neuropathy is a type of nerve damage. It affects nerves that carry signals between the spinal cord and the arms, legs, and the rest of the body (peripheral nerves). It does not affect nerves in the spinal cord or brain. In peripheral neuropathy, one nerve or a group of nerves may be damaged. Peripheral neuropathy is a broad category that includes many specific nerve disorders, like diabetic neuropathy, hereditary neuropathy, and carpal tunnel syndrome. What are the causes? This condition may be caused by: Certain diseases, such as: Diabetes. This is the most common cause of peripheral neuropathy. Autoimmune diseases, such as rheumatoid arthritis and systemic lupus erythematosus. Nerve diseases that are passed from parent to child (inherited). Kidney disease. Thyroid disease. Other causes may include: Nerve injury. Pressure or stress on a nerve that lasts a long time. Lack (deficiency) of B vitamins. This can result from alcoholism, poor diet, or a restricted diet. Infections. Some medicines, such as cancer medicines (chemotherapy). Poisonous (toxic) substances, such as lead and mercury. Too little blood flowing to the legs. In some cases, the cause of this condition is not known. What are the signs or symptoms? Symptoms of this condition depend on which of your nerves is damaged. Symptoms in the legs, hands, and arms can include: Loss of feeling (numbness) in the feet, hands, or both. Tingling in the feet, hands, or both. Burning pain. Very sensitive skin. Weakness. Not being able to move a part of the body (paralysis). Clumsiness or poor coordination. Muscle twitching. Loss of balance. Symptoms in other parts of the body can include: Not being able to control your bladder. Feeling dizzy. Sexual problems. How is this diagnosed? Diagnosing  and finding the cause of peripheral neuropathy can be difficult. Your health care provider will take your medical history and do a physical exam. A neurological exam will also be done. This involves checking things that are affected by your brain, spinal cord, and nerves (nervous system). For example, your health care provider will check your reflexes, how you move, and what you can feel. You may have other tests, such as: Blood tests. Electromyogram (EMG) and nerve conduction tests. These tests check nerve function and how well the nerves are controlling the muscles. Imaging tests, such as a CT scan or MRI, to rule out other causes of your symptoms. Removing a small piece of nerve to be examined in a lab (nerve biopsy). Removing and examining a small amount of the fluid that surrounds the brain and spinal cord (lumbar puncture). How is this treated? Treatment for this condition may involve: Treating the underlying cause of the neuropathy, such as diabetes, kidney disease, or vitamin deficiencies. Stopping medicines that can cause neuropathy, such as chemotherapy. Medicine to help relieve pain. Medicines may include: Prescription or over-the-counter pain medicine. Anti-seizure medicine. Antidepressants. Pain-relieving patches that are applied to painful areas of skin. Surgery to relieve pressure on a nerve or to destroy a nerve that is causing pain. Physical therapy to help improve movement and balance. Devices to help you move around (assistive devices). Follow these instructions at home: Medicines Take over-the-counter and prescription medicines only as told by your health care provider. Do not take any other medicines without first asking your health care provider. Ask your health care provider if the medicine prescribed to you requires you to avoid driving or using machinery. Lifestyle  Do not use any products  that contain nicotine or tobacco. These products include cigarettes, chewing  tobacco, and vaping devices, such as e-cigarettes. Smoking keeps blood from reaching damaged nerves. If you need help quitting, ask your health care provider. Avoid or limit alcohol. Too much alcohol can cause a vitamin B deficiency, and vitamin B is needed for healthy nerves. Eat a healthy diet. This includes: Eating foods that are high in fiber, such as beans, whole grains, and fresh fruits and vegetables. Limiting foods that are high in fat and processed sugars, such as fried or sweet foods. General instructions  If you have diabetes, work closely with your health care provider to keep your blood sugar under control. If you have numbness in your feet: Check every day for signs of injury or infection. Watch for redness, warmth, and swelling. Wear padded socks and comfortable shoes. These help protect your feet. Develop a good support system. Living with peripheral neuropathy can be stressful. Consider talking with a mental health specialist or joining a support group. Use assistive devices and attend physical therapy as told by your health care provider. This may include using a walker or a cane. Keep all follow-up visits. This is important. Where to find more information [employer] of Neurological Disorders: [employer] Contact a health care provider if: You have new signs or symptoms of peripheral neuropathy. You are struggling emotionally from dealing with peripheral neuropathy. Your pain is not well controlled. Get help right away if: You have an injury or infection that is not healing normally. You develop new weakness in an arm or leg. You have fallen or do so frequently. Summary Peripheral neuropathy is when the nerves in the arms or legs are damaged, resulting in numbness, weakness, or pain. There are many causes of peripheral neuropathy, including diabetes, pinched nerves, vitamin deficiencies, autoimmune disease, and hereditary conditions. Diagnosing and finding  the cause of peripheral neuropathy can be difficult. Your health care provider will take your medical history, do a physical exam, and do tests, including blood tests and nerve function tests. Treatment involves treating the underlying cause of the neuropathy and taking medicines to help control pain. Physical therapy and assistive devices may also help. This information is not intended to replace advice given to you by your health care provider. Make sure you discuss any questions you have with your health care provider. Document Revised: 03/02/2021 Document Reviewed: 03/02/2021 Elsevier Patient Education  [place].

## 2022-01-24 NOTE — Assessment & Plan Note (Signed)
Tolerating statin, encouraged heart healthy diet, avoid trans fats, minimize simple carbs and saturated fats. Increase exercise as tolerated 

## 2022-01-24 NOTE — Assessment & Plan Note (Signed)
No complaints of recent flares. Hydrate and monitor

## 2022-02-28 ENCOUNTER — Encounter: Payer: Self-pay | Admitting: Podiatry

## 2022-02-28 ENCOUNTER — Ambulatory Visit (INDEPENDENT_AMBULATORY_CARE_PROVIDER_SITE_OTHER): Payer: Medicare Other | Admitting: Podiatry

## 2022-02-28 DIAGNOSIS — E119 Type 2 diabetes mellitus without complications: Secondary | ICD-10-CM | POA: Diagnosis not present

## 2022-02-28 DIAGNOSIS — M79675 Pain in left toe(s): Secondary | ICD-10-CM | POA: Diagnosis not present

## 2022-02-28 DIAGNOSIS — B351 Tinea unguium: Secondary | ICD-10-CM

## 2022-02-28 DIAGNOSIS — L84 Corns and callosities: Secondary | ICD-10-CM

## 2022-02-28 DIAGNOSIS — M79674 Pain in right toe(s): Secondary | ICD-10-CM | POA: Diagnosis not present

## 2022-03-08 NOTE — Progress Notes (Signed)
  Subjective:  Patient ID: Evan Best, male    DOB: Feb 06, 1951,  MRN: [id number]  71 y.o. male presents preventative diabetic foot care and callus(es) right lower extremity and painful thick toenails that are difficult to trim. Painful toenails interfere with ambulation. Aggravating factors include wearing enclosed shoe gear. Pain is relieved with periodic professional debridement. Painful calluses are aggravated when weightbearing with and without shoegear. Pain is relieved with periodic professional debridement.  Last known  HgA1c was 7.0%. Patient did not check blood glucose this morning.  New problem(s): None   PCP is [name], MD , and last visit was 01/24/2022.  No Known Allergies  Review of Systems: Negative except as noted in the HPI.   Objective:  Evan Best is a pleasant 71 y.o. male, WD, WN in NAD. AAO x 3.  Vascular Examination: Vascular status intact b/l with palpable pedal pulses. CFT immediate b/l. No edema. No pain with calf compression b/l. Skin temperature gradient WNL b/l. Pedal hair sparse. Varicosities present b/l. Evidence of chronic venous insufficiency b/l LE.  Neurological Examination: Sensation grossly intact b/l with 10 gram monofilament. Vibratory sensation intact b/l.   Dermatological Examination: Pedal skin with normal turgor, texture and tone b/l. Toenails 1-5 b/l thick, discolored, elongated with subungual debris and pain on dorsal palpation. Hyperkeratotic lesion(s) medial IPJ of right great toe.  No erythema, no edema, no drainage, no fluctuance.  Musculoskeletal Examination: Muscle strength 5/5 to b/l LE. No pain, crepitus or joint limitation noted with ROM bilateral LE. No gross bony deformities bilaterally. Patient ambulates independent of any assistive aids.  Radiographs: None  Last A1c:      Latest Ref Rng & Units 11/19/2021    3:33 PM 07/13/2021    9:59 AM 03/19/2021   10:21 AM  Hemoglobin A1C  Hemoglobin-A1c 4.6 - 6.5 % 5.6  5.5   5.9      Assessment:   1. Pain due to onychomycosis of toenails of both feet   2. Callus   3. Controlled type 2 diabetes mellitus without complication, without long-term current use of insulin ([place])    Plan:  -Examined patient. -No new findings. No new orders. -Patient to continue soft, supportive shoe gear daily. -Mycotic toenails 1-5 bilaterally were debrided in length and girth with sterile nail nippers and dremel without incident. -Callus(es) medial IPJ of right great toe pared utilizing sterile scalpel blade without complication or incident. Total number debrided =1. -Patient/POA to call should there be question/concern in the interim.  Return in about 3 months (around 05/31/2022).  Marzetta Board, DPM

## 2022-04-06 DIAGNOSIS — L821 Other seborrheic keratosis: Secondary | ICD-10-CM | POA: Diagnosis not present

## 2022-04-06 DIAGNOSIS — L814 Other melanin hyperpigmentation: Secondary | ICD-10-CM | POA: Diagnosis not present

## 2022-04-06 DIAGNOSIS — L237 Allergic contact dermatitis due to plants, except food: Secondary | ICD-10-CM | POA: Diagnosis not present

## 2022-04-06 DIAGNOSIS — Z86018 Personal history of other benign neoplasm: Secondary | ICD-10-CM | POA: Diagnosis not present

## 2022-04-06 DIAGNOSIS — L578 Other skin changes due to chronic exposure to nonionizing radiation: Secondary | ICD-10-CM | POA: Diagnosis not present

## 2022-04-06 DIAGNOSIS — D229 Melanocytic nevi, unspecified: Secondary | ICD-10-CM | POA: Diagnosis not present

## 2022-04-12 ENCOUNTER — Other Ambulatory Visit: Payer: Self-pay | Admitting: Family Medicine

## 2022-04-15 ENCOUNTER — Other Ambulatory Visit: Payer: Self-pay | Admitting: Family Medicine

## 2022-04-15 DIAGNOSIS — E611 Iron deficiency: Secondary | ICD-10-CM

## 2022-05-11 ENCOUNTER — Encounter (HOSPITAL_COMMUNITY): Payer: Self-pay

## 2022-05-11 ENCOUNTER — Emergency Department (HOSPITAL_COMMUNITY)
Admission: EM | Admit: 2022-05-11 | Discharge: 2022-05-11 | Disposition: A | Discharge status: Home / Self Care | Payer: Medicare Other | Attending: Emergency Medicine | Admitting: Emergency Medicine

## 2022-05-11 ENCOUNTER — Other Ambulatory Visit: Payer: Self-pay

## 2022-05-11 DIAGNOSIS — W260XXA Contact with knife, initial encounter: Secondary | ICD-10-CM | POA: Diagnosis not present

## 2022-05-11 DIAGNOSIS — Y92000 Kitchen of unspecified non-institutional (private) residence as  the place of occurrence of the external cause: Secondary | ICD-10-CM | POA: Diagnosis not present

## 2022-05-11 DIAGNOSIS — S61211A Laceration without foreign body of left index finger without damage to nail, initial encounter: Secondary | ICD-10-CM | POA: Diagnosis not present

## 2022-05-11 DIAGNOSIS — Z5321 Procedure and treatment not carried out due to patient leaving prior to being seen by health care provider: Secondary | ICD-10-CM | POA: Diagnosis not present

## 2022-05-11 DIAGNOSIS — S65501A Unspecified injury of blood vessel of left index finger, initial encounter: Secondary | ICD-10-CM | POA: Diagnosis present

## 2022-05-11 DIAGNOSIS — S61201A Unspecified open wound of left index finger without damage to nail, initial encounter: Secondary | ICD-10-CM | POA: Diagnosis not present

## 2022-05-11 NOTE — ED Triage Notes (Signed)
Pt has a left index finger laceration from his knife, pt was trying to cut a hole in something in his kitchen and it slipped.

## 2022-05-14 DIAGNOSIS — S61412A Laceration without foreign body of left hand, initial encounter: Secondary | ICD-10-CM | POA: Diagnosis not present

## 2022-05-19 ENCOUNTER — Telehealth: Payer: Self-pay

## 2022-05-19 NOTE — Telephone Encounter (Signed)
        Patient  visited [place] on 11/1    Telephone encounter attempt :  1st  A HIPAA compliant voice message was left requesting a return call.  Instructed patient to call back   [facility]  [phone] [address], [place], [place] [address] Phone: [phone] Email: [email or web address]

## 2022-05-25 ENCOUNTER — Other Ambulatory Visit: Payer: Self-pay | Admitting: Family Medicine

## 2022-05-27 ENCOUNTER — Telehealth: Payer: Self-pay | Admitting: Family Medicine

## 2022-05-27 NOTE — Telephone Encounter (Signed)
Left message for patient to call back and schedule Medicare Annual Wellness Visit (AWV) either virtually or phone  Left  my Evan Best number [phone]   Last AWV 05/11/21 please schedule with Nurse Health Adviser 2   45 min for awv-i and in office appointments 30 min for awv-s  phone/virtual appointments

## 2022-05-30 NOTE — Telephone Encounter (Signed)
Returned patients call  Patient returned my call 05/27/22

## 2022-05-31 ENCOUNTER — Ambulatory Visit (INDEPENDENT_AMBULATORY_CARE_PROVIDER_SITE_OTHER): Payer: Medicare Other | Admitting: Family Medicine

## 2022-05-31 DIAGNOSIS — R634 Abnormal weight loss: Secondary | ICD-10-CM

## 2022-05-31 DIAGNOSIS — Z634 Disappearance and death of family member: Secondary | ICD-10-CM | POA: Diagnosis not present

## 2022-05-31 DIAGNOSIS — Z0000 Encounter for general adult medical examination without abnormal findings: Secondary | ICD-10-CM | POA: Diagnosis not present

## 2022-05-31 NOTE — Progress Notes (Signed)
PATIENT CHECK-IN and HEALTH RISK ASSESSMENT QUESTIONNAIRE:  -completed by phone/video for upcoming Medicare Preventive Visit  Pre-Visit Check-in: 1)Vitals (height, wt, BP, etc) - record in vitals section for visit on day of visit 2)Review and Update Medications, Allergies PMH, Surgeries, Social history in Epic 3)Hospitalizations in the last year with date/reason? none  4)Review and Update Care Team (patient's specialists) in Epic 5) Complete PHQ9 in Epic  6) Complete Fall Screening in Epic 7)Review all Health Maintenance Due and order under PCP if not done.  8)Medicare Wellness Questionnaire: Answer theses question about your habits: Do you drink alcohol? none How many drinks do you have a day?none Have you ever smoked?none Do you use an illicit drugs?none Do you exercises? No much IF so, what type and how many days/minutes per week? Neuropathy and knees limit what he can do - has had knee surgery remotely Lost his wife a few months ago and not cooking much Eating mostly microwave meals now as he does not cook. Monitors protein to carb ratio. He has been eating more vegetarian meals. Adds nuts to the vegetarian meals. Reports glu has been good. Nuts and and broccoli and cauliflower at the store and toss with vinaigrette.  What beverages do you drink besides water: water  Answer theses question about you: Can you perform most household chores? yes Do you find it hard to follow a conversation in a noisy room? no Do you find it hard to understand a speaker at church or in a meeting?no Do you often ask people to speak up or repeat themselves?no Do you experience ringing in your ears? no Do you have difficulty understanding a soft or whispered voice?no Do you feel that you have a problem with memory?no Do you often misplace items?no Do you balance your checkbook and or bank acounts?yes Do you feel safe at home? yes Last dentist visit? Earlier this month Do you need assistance with any  of the following: none except if noted  Driving?  Feeding yourself?  Getting from bed to chair?  Getting to the toilet?  Bathing or showering?  Dressing yourself?  Managing money?  Climbing a flight of stairs?  Preparing meals?  Do you have Advanced Directives in place (Living Will, Healthcare Power or Attorney)?  No, plans to do this - seeing an attorney   Last eye Exam and location? Goes annually, has glaucoma check   Do you currently use prescribed or non-prescribed narcotic or opioid pain medications?  none    ----------------------------------------------------------------------------------------------------------------------------------------------------------------------------------------------------------------------   MEDICARE ANNUAL PREVENTIVE VISIT WITH PROVIDER: (Welcome to [employer], initial annual wellness or annual wellness exam)  Virtual Visit via Video Note  I connected with Evan Best  on 05/31/22 by a video enabled telemedicine application and verified that I am speaking with the correct person using two identifiers.  Location patient: home Location provider:work or home office Persons participating in the virtual visit: patient, provider  Concerns and/or follow up today: none, has lost some weight since his wife died - is not concerned about this as he feels is from eating less and a little different. Reports chronic mild depressed mood. He does not feel has worsened substantially and is not interested in CBT, bereavement counseling or treatment for this. Denies SI or thoughts of self harm.    See HM section in Epic for other details of completed HM.    ROS: negative for report of fevers, unintentional weight loss, vision changes, vision loss, hearing loss or change, chest pain, sob, hemoptysis,  hematuria,  genital discharge or lesions, falls, bleeding or bruising, loc, thoughts of suicide or self harm, memory loss. Reports hx of intermittent BRB on TP  chronically and reports was evaluated for this in the past.   Patient-completed extensive health risk assessment - reviewed and discussed with the patient: See Health Risk Assessment completed with patient prior to the visit either above or in recent phone note. This was reviewed in detailed with the patient today and appropriate recommendations, orders and referrals were placed as needed per Summary below and patient instructions.   Review of Medical History: -PMH, [place], Family History and current specialty and care providers reviewed and updated and listed below   Patient Care Team: [name], MD as PCP - General (Family Medicine) Clent Jacks, MD as Consulting Physician (Ophthalmology) Deneise Lever, MD as Consulting Physician (Pulmonary Disease)   Past Medical History:  Diagnosis Date   Allergic state 09/22/2013   Anemia 09/07/2015   Arthritis 03/06/2015   Diabetes mellitus type II 1/09   Dysphagia 05/04/2017   Esophageal reflux 03/06/2015   Gout 09/18/2012   Left big toe   Hypertension    Insomnia 09/13/2015   Migraine headache    remote history   OSA (obstructive sleep apnea)    Preventative health care 09/05/2014   Right shoulder pain 02/19/2009   Qualifier: Diagnosis of  By: Wynona Luna    RLS (restless legs syndrome) 09/07/2015   Sleep apnea    uses cpap   Tremor 09/07/2015    Past Surgical History:  Procedure Laterality Date   HERNIA REPAIR  2831   umbilical hernia repair   KNEE SURGERY     left knee surgery   TONSILLECTOMY  1959   VASECTOMY      Social History   Socioeconomic History   Marital status: Widowed    Spouse name: Not on file   Number of children: 1   Years of education: Not on file   Highest education level: Not on file  Occupational History   Not on file  Tobacco Use   Smoking status: Former    Years: 5.00    Types: Cigarettes    Quit date: 2005    Years since quitting: 18.8   Smokeless tobacco: Never   Tobacco comments:     1 pack per year  Vaping Use   Vaping Use: Never used  Substance and Sexual Activity   Alcohol use: No   Drug use: No   Sexual activity: Yes  Other Topics Concern   Not on file  Social History Narrative   Occupation:  Chemical engineer   Married  -  1 daughter (age 39)    Never Smoked   Alcohol use-no     Drug use-no          Social Determinants of Health   Financial Resource Strain: Low Risk  (05/11/2021)   Overall Financial Resource Strain (CARDIA)    Difficulty of Paying Living Expenses: Not hard at all  Food Insecurity: No Food Insecurity (05/11/2021)   Hunger Vital Sign    Worried About Running Out of Food in the Last Year: Never true    [place] in the Last Year: Never true  Transportation Needs: No Transportation Needs (05/11/2021)   PRAPARE - Hydrologist (Medical): No    Lack of Transportation (Non-Medical): No  Physical Activity: Inactive (05/11/2021)   Exercise Vital Sign    Days of Exercise per  Week: 0 days    Minutes of Exercise per Session: 0 min  Stress: No Stress Concern Present (05/11/2021)   [place]    Feeling of Stress : Not at all  Social Connections: Socially Isolated (05/11/2021)   Social Connection and Isolation Panel [NHANES]    Frequency of Communication with Friends and Family: Once a week    Frequency of Social Gatherings with Friends and Family: Once a week    Attends Religious Services: Never    Marine scientist or Organizations: No    Attends Archivist Meetings: Never    Marital Status: Married  Human resources officer Violence: Not At Risk (05/11/2021)   Humiliation, Afraid, Rape, and Kick questionnaire    Fear of Current or Ex-Partner: No    Emotionally Abused: No    Physically Abused: No    Sexually Abused: No    Family History  Problem Relation Age of Onset   Breast cancer Mother    Hypertension Mother    Hyperlipidemia  Father    Kidney disease Father    Heart disease Father    Hypertension Father    Colon cancer Neg Hx    Stomach cancer Neg Hx     Current Outpatient Medications on File Prior to Visit  Medication Sig Dispense Refill   amLODipine (NORVASC) 10 MG tablet TAKE 1 TABLET BY MOUTH EVERY DAY 90 tablet 1   Calcium-Magnesium-Zinc 1000-400-15 MG TABS Take by mouth daily.     cetirizine (ZYRTEC) 10 MG tablet Take 10 mg by mouth daily.     Chelated Potassium 99 MG TABS Take 99 mg by mouth 2 (two) times daily.     Cholecalciferol (D3 ADULT PO) Take by mouth.     Cinnamon 500 MG capsule Take 500 mg 3 (three) times daily by mouth. +50 mg Chromium in each supplement.     Coenzyme Q10 (CO Q-10) 100 MG CAPS Take 200 mg by mouth daily.     Cyanocobalamin (VITAMIN B 12 PO) Take 1 capsule daily by mouth.     famotidine (PEPCID) 40 MG tablet TAKE 1 TABLET BY MOUTH EVERY DAY 90 tablet 1   ferrous sulfate 325 (65 FE) MG tablet Take 1 tablet (325 mg total) by mouth daily with breakfast. 90 tablet 0   Glucosamine-Chondroitin-Vit D3 1500-1200-800 MG-MG-UNIT PACK Take by mouth.     glucose blood test strip One touch Ultra blue -Check sugars daily-  DX E11.69 100 each 11   lisinopril (ZESTRIL) 40 MG tablet TAKE 1 TABLET BY MOUTH EVERY DAY 90 tablet 1   Melatonin 10 MG TABS Take by mouth.     metFORMIN (GLUCOPHAGE) 500 MG tablet TAKE 2 TABLETS BY MOUTH TWICE A DAY WITH MEALS 360 tablet 1   Misc Natural Products (TART CHERRY ADVANCED PO) Take by mouth.     Omega-3 Fatty Acids (FISH OIL PO) Take 1,500 mg by mouth daily.     OMEPRAZOLE PO      Quercetin 250 MG TABS Take by mouth 2 (two) times daily.      simvastatin (ZOCOR) 10 MG tablet TAKE 1 TABLET BY MOUTH EVERYDAY AT BEDTIME 90 tablet 1   triamcinolone cream (KENALOG) 0.1 % apply     TURMERIC PO Take by mouth.     vitamin C (ASCORBIC ACID) 500 MG tablet Take 500 mg by mouth 3 (three) times daily.     No current facility-administered medications on file prior to  visit.    No Known Allergies     Physical Exam There were no vitals filed for this visit. Estimated body mass index is 27.89 kg/m as calculated from the following:   Height as of 01/24/22: '5\' 11"'$  (1.803 m).   Weight as of 01/24/22: 200 lb (90.7 kg).  EKG (optional): deferred due to virtual visit  GENERAL: alert, oriented, no audible sounds of distress  PSYCH/NEURO: pleasant and cooperative, no obvious depression or anxiety, speech and thought processing grossly intact, Cognitive function grossly intact  [place] Office Visit from 01/24/2022 in [place] at [facility]  PHQ-9 Total Score 9           05/31/2022    5:50 PM 01/24/2022    3:49 PM 05/11/2021    1:07 PM 11/30/2020    3:39 PM 04/28/2020    2:46 PM  Depression screen PHQ 2/9  Decreased Interest 2 2 0 0 3  Down, Depressed, Hopeless 0 2 0 0 3  PHQ - 2 Score 2 4 0 0 6  Altered sleeping  2   1  Tired, decreased energy  1   1  Change in appetite  1   1  Feeling bad or failure about yourself   0   0  Trouble concentrating  1   1  Moving slowly or fidgety/restless  0   0  Suicidal thoughts  0   0  PHQ-9 Score  9   10  Difficult doing work/chores  Somewhat difficult      Patient with recent loss of spouse. Currently grieving with reported chronic baseline depression - denies worsening, SI, thoughts of harm and is not interested at this time in evaluation for this or services/tx/counseling/etc.    SUMMARY AND PLAN:  Medicare annual wellness visit, subsequent  Weight loss  Bereavement  He is not concerned about the weight loss - but reports when questioned that he thinks he lost about 15-20 lbs over the last few months. Discussed potential etiologies and advised evaluation with PCP. Most likely bereavement related but needs eval. Sent message to schedulers to assist in scheduling appt. He reports chronic "mild" depression and denies worsening, sI or thoughts of harm. Declines  eval/referral for treatment. Discussed bereavement counseling - declined.  He would likely benefit from PT eval and nutrition referral after workup for the wt loss. Did not seem interested at the moment. Have advised close inperson follow up with PCP for theses issues and HM measures per below. He agrees to schedule. Sent message to schedulers to assist.   The following health maintenance/preventive care measures were recommended/discussed and the patient was referred if needed and if the patient agreeable:  Health Maintenance  Topic Date Due   Diabetic kidney evaluation - Urine ACR  04/25/2009 - he plans to do with PCP   INFLUENZA VACCINE  02/08/2022   HEMOGLOBIN A1C  05/22/2022 - he plans to do with PCP, agrees to schedule appt    COVID-19 Vaccine (3 - Pfizer risk series) 06/01/2023 (Originally 12/10/2019)   COLONOSCOPY (Pts 45-38yr Insurance coverage will need to be confirmed)  07/07/2022 - he has concerns about doing this and plans to discuss further with PCP.    FOOT EXAM  08/23/2022   Diabetic kidney evaluation - GFR measurement  11/20/2022   OPHTHALMOLOGY EXAM  12/09/2022   Medicare Annual Wellness (AWV)  06/01/2023   Pneumonia Vaccine 71 Years old  Completed   Hepatitis C Screening  Completed  Zoster Vaccines- Shingrix  Completed   HPV VACCINES  Aged [employer] and counseling on the following was provided based on the above review of health and a plan/checklist for the patient, along with additional information discussed, was provided for the patient in the patient instructions :  -Advised on importance of and resources for completing advanced directives -Provided counseling and plan for function difficulties/ difficulties with ADLs if applicable per above screening. -Advised and counseled on maintaining healthy weight and healthy lifestyle - including the importance of a health diet, regular physical activity, social connections and stress management. -Advised and counseled  on a whole foods based healthy diet and regular exercise: discussed a heart healthy whole foods based diet at length. A summary of a healthy diet was provided in the Patient Instructions.  -he could benefit from PT and nutrition referral, however he did not seem interested today. Have advised close follow up with PCP.  -Advised yearly dental visits at minimum and regular eye exams -Advised and counseled on alcohol, tobacco, drug, opoid use/misuse if applicable and options for help if applicable.  Follow up: see patient instructions     Patient Instructions  CHECKLIST FOR A HEALTHY LIFE:  -Follow up: -yearly for annual wellness visit with primary care office -schedule follow up with Dr. Randel Pigg to get labs, check weight and discuss further and discuss your colon cancer screening.    Health Maintenance  Topic Date Due   Diabetic kidney evaluation - Urine ACR  04/25/2009   INFLUENZA VACCINE  02/08/2022   HEMOGLOBIN A1C  05/22/2022   COVID-19 Vaccine (3 - Pfizer risk series) 06/01/2023 (Originally 12/10/2019)   COLONOSCOPY (Pts 45-20yr Insurance coverage will need to be confirmed)  07/07/2022 - has concerns about upcoming colonoscopy. Plans to discuss further with PCP.    FOOT EXAM  08/23/2022   Diabetic kidney evaluation - GFR measurement  11/20/2022   OPHTHALMOLOGY EXAM  12/09/2022   Medicare Annual Wellness (AWV)  06/01/2023   Pneumonia Vaccine 71 Years old  Completed   Hepatitis C Screening  Completed   Zoster Vaccines- Shingrix  Completed   HPV VACCINES  Aged Out    -See a dentist at least yearly  -Get your eyes checked per your eye specialist's recommendations  -Other issues addressed today: -consider increasing vegetables in diet -consider increasing physical activity as able -advanced  directives      FOOD - THE FUEL FOR A HAPPY HEALTHY LIFE: -eat real food: lots of colorful vegetables (half the plate) -consume on a regular basis: whole grains (make sure first ingredient on label contains the word "whole"), fresh fruits, fish, nuts, seeds, healthy oils (such as olive oil, avocado oil, grape seed oil) -may eat small amounts of dairy and lean meat on occasion, but avoid processed meats such as ham, bacon, lunch meat, etc. -drink water -try to avoid fast food and pre-packaged foods, processed meat -try to avoid foods that contain any ingredients with names you do not recognize  -try to avoid sugar/sweets (except for the natural sugar that occurs in fresh fruit) -try to avoid sweet drinks -try to avoid white rice, white bread, pasta (unless whole grain), white or yellow potatoes  Seeing a Nutritionist may help with ensuring adequate nutrient intake while balancing health concerns.   MOVE - the key to keeping your body moving and working best: -gradually increase intentional physical activity -move and stretch your body, legs, feet and arms when sitting for long periods -try to start with  a goal of 10 minutes of exercise per day (eventually 150 minutes per week would be ideal) -recommend balance exercises 3+days per week:   Stand somewhere where you have something sturdy to hold onto if you lose balance.    1) lift up on toes, start with 5x per day and work up to 20x   2) stand and lift on leg straight out to the side so that foot is a few inches of the floor, start with 5x each side and work up to 20x each side   3) stand on one foot, start with 5 seconds each side and work up to 20 seconds on each side  If you would like  further assistant with exercise despite health limitations please let us know so that we can place aphysical therapy referral.    STRESS MANAGEMENT - so important for health and well being -try meditating, or just sitting quietly with deep breathing while intentionally relaxing all parts of your body for 5 minutes daily  SOCIAL CONNECTIONS: -options in [place] if you wish to engage in more social and exercise related activities:  -Silver sneakers https://tools.silversneakers.com   -YouTube has lots of exercise videos for different ages and abilities as well  -[place] (a variety of indoor and outdoor inperson activities for adults). [phone]. [address]  -Virtual Online Classes (a variety of topics): see [email or web address] or call [phone]    ADVANCED HEALTHCARE DIRECTIVES:  Everyone should have advanced health care directives in place. This is so that you get the care you want, should you ever be in a situation where you are unable to make your own medical decisions.   From the [place] Advanced Directive Website: "[place] are legal documents in which you give written instructions about your health care if, in the future, you cannot speak for yourself.   A health care power of attorney allows you to name a person you trust to make your health care decisions if you cannot make them yourself. A declaration of a desire for a natural death (or living will) is document, which states that you desire not to have your life prolonged by extraordinary measures if you have a terminal or incurable illness or if you are in a vegetative state. An advance instruction for mental health treatment makes a declaration of instructions, information and preferences regarding your mental health treatment. It also states that you are aware that the advance instruction authorizes a mental health treatment provider to act according to your wishes. It may  also outline your consent or refusal of mental health treatment. A declaration of an anatomical gift allows anyone over the age of 44 to make a gift by will, organ donor card or other document."   Please see the following website or an elder law attorney for forms, FAQs and for completion of advanced directives: [place] Secretary of [place] ([email or web address])  Or copy and paste the following to your web browser: [email or web address]        Lucretia Kern, DO

## 2022-05-31 NOTE — Patient Instructions (Addendum)
CHECKLIST FOR A HEALTHY LIFE:  -Follow up: -yearly for annual wellness visit with primary care office -schedule follow up with Dr. Randel Pigg to get labs, check weight and discuss further and discuss your colon cancer screening.    Health Maintenance  Topic Date Due   Diabetic kidney evaluation - Urine ACR  04/25/2009   INFLUENZA VACCINE  02/08/2022   HEMOGLOBIN A1C  05/22/2022   COVID-19 Vaccine (3 - Pfizer risk series) 06/01/2023 (Originally 12/10/2019)   COLONOSCOPY (Pts 45-64yr Insurance coverage will need to be confirmed)  07/07/2022 - has concerns about upcoming colonoscopy. Plans to discuss further with PCP.    FOOT EXAM  08/23/2022   Diabetic kidney evaluation - GFR measurement  11/20/2022   OPHTHALMOLOGY EXAM  12/09/2022   Medicare Annual Wellness (AWV)  06/01/2023   Pneumonia Vaccine 71 Years old  Completed   Hepatitis C Screening  Completed   Zoster Vaccines- Shingrix  Completed   HPV VACCINES  Aged Out    -See a dentist at least yearly  -Get your eyes checked per your eye specialist's recommendations  -Other issues addressed today: -consider increasing vegetables in diet -consider increasing physical activity as able      [place] -eat real food: lots of colorful vegetables (half the plate) -consume on a regular basis: whole grains (make sure first ingredient on label contains the word "whole"), fresh fruits, fish, nuts, seeds, healthy oils (such as olive oil, avocado oil, grape seed oil) -may eat small amounts of dairy and lean meat on occasion, but avoid processed meats such as ham, bacon, lunch meat, etc. -drink water -try to avoid fast food and pre-packaged foods, processed meat -try  to avoid foods that contain any ingredients with names you do not recognize  -try to avoid sugar/sweets (except for the natural sugar that occurs in fresh fruit) -try to avoid sweet drinks -try to avoid white rice, white bread, pasta (unless whole grain), white or yellow potatoes  MOVE - the key to keeping your body moving and working best: -gradually increase intentional physical activity -move and stretch your body, legs, feet and arms when sitting for long periods -try to start with a goal of 10 minutes of exercise per day (eventually 150 minutes per week would be ideal) -recommend balance exercises 3+days per week:   Stand somewhere where you have something sturdy to hold onto if you lose balance.    1) lift up on toes, start with 5x per day and work up to 20x   2) stand and lift on leg straight out to the side so that foot is a few inches of the floor, start with 5x each side and work up to 20x each side   3) stand on one foot, start with 5 seconds each side and work up to 20 seconds on each side  -Silver sneakers https://tools.silversneakers.com   STRESS MANAGEMENT - so important for health and well being -try meditating, or just sitting quietly with deep breathing while intentionally relaxing all parts of your body for 5 minutes daily  SOCIAL CONNECTIONS: -options in [place] you wish to engage in more social and exercise related activities:  -Silver sneakers https://tools.silversneakers.com   -YouTube has lots of exercise videos for different ages and abilities as well  -[employer](a variety of indoor and outdoor inperson activities for adults). [phone] [address]  -Virtual Online Classes (a variety of topics): see seniorplanet.org or call [phone]  ADVANCED HEALTHCARE DIRECTIVES:  Everyone should have advanced health care directives in place. This is so that you get the care you want, should you ever be in a situation where  you are unable to make your own medical decisions.   From the [place] Advanced Directive Website: "[place] are legal documents in which you give written instructions about your health care if, in the future, you cannot speak for yourself.   A health care power of attorney allows you to name a person you trust to make your health care decisions if you cannot make them yourself. A declaration of a desire for a natural death (or living will) is document, which states that you desire not to have your life prolonged by extraordinary measures if you have a terminal or incurable illness or if you are in a vegetative state. An advance instruction for mental health treatment makes a declaration of instructions, information and preferences regarding your mental health treatment. It also states that you are aware that the advance instruction authorizes a mental health treatment provider to act according to your wishes. It may also outline your consent or refusal of mental health treatment. A declaration of an anatomical gift allows anyone over the age of 70 to make a gift by will, organ donor card or other document."   Please see the following website or an elder law attorney for forms, FAQs and for completion of advanced directives: [place] Secretary of [place] ([email or web address])  Or copy and paste the following to your web browser: [email or web address]

## 2022-06-08 ENCOUNTER — Telehealth: Payer: Self-pay

## 2022-06-08 NOTE — Telephone Encounter (Signed)
Called pt appt made for 06/16/22

## 2022-06-13 ENCOUNTER — Ambulatory Visit (INDEPENDENT_AMBULATORY_CARE_PROVIDER_SITE_OTHER): Payer: Medicare Other | Admitting: Podiatry

## 2022-06-13 ENCOUNTER — Other Ambulatory Visit: Payer: Self-pay

## 2022-06-13 ENCOUNTER — Encounter: Payer: Self-pay | Admitting: Podiatry

## 2022-06-13 VITALS — BP 187/95

## 2022-06-13 DIAGNOSIS — B351 Tinea unguium: Secondary | ICD-10-CM | POA: Diagnosis not present

## 2022-06-13 DIAGNOSIS — E119 Type 2 diabetes mellitus without complications: Secondary | ICD-10-CM | POA: Diagnosis not present

## 2022-06-13 DIAGNOSIS — Z86018 Personal history of other benign neoplasm: Secondary | ICD-10-CM | POA: Insufficient documentation

## 2022-06-13 DIAGNOSIS — E782 Mixed hyperlipidemia: Secondary | ICD-10-CM

## 2022-06-13 DIAGNOSIS — L84 Corns and callosities: Secondary | ICD-10-CM | POA: Diagnosis not present

## 2022-06-13 DIAGNOSIS — E1169 Type 2 diabetes mellitus with other specified complication: Secondary | ICD-10-CM

## 2022-06-13 DIAGNOSIS — M79674 Pain in right toe(s): Secondary | ICD-10-CM

## 2022-06-13 DIAGNOSIS — M79675 Pain in left toe(s): Secondary | ICD-10-CM | POA: Diagnosis not present

## 2022-06-13 NOTE — Progress Notes (Signed)
  Subjective:  Patient ID: Evan Best, male    DOB: 1951/02/22,  MRN: [id number]  [name] presents to clinic today for preventative diabetic foot care and callus(es) right lower extremity and painful thick toenails that are difficult to trim. Painful toenails interfere with ambulation. Aggravating factors include wearing enclosed shoe gear. Pain is relieved with periodic professional debridement. Painful calluses are aggravated when weightbearing with and without shoegear. Pain is relieved with periodic professional debridement.  Chief Complaint  Patient presents with   Nail Problem    [facility] BS-113 A1C-"Not sure" PCP-Blyth, Stacy PCP VST-01/24/2022   New problem(s): None.   PCP is [name], MD.  No Known Allergies  Review of Systems: Negative except as noted in the HPI.  Objective: No changes noted in today's physical examination. Vitals:   06/13/22 1553  BP: (!) 187/95   Evan Best is a pleasant 71 y.o. male WD, WN in NAD. AAO x 3.  Vascular Examination: Vascular status intact b/l with palpable pedal pulses. CFT immediate b/l. No edema. No pain with calf compression b/l. Skin temperature gradient WNL b/l. Pedal hair sparse. Varicosities present b/l. Evidence of chronic venous insufficiency b/l LE.  Neurological Examination: Sensation grossly intact b/l with 10 gram monofilament. Vibratory sensation intact b/l.   Dermatological Examination: Pedal skin with normal turgor, texture and tone b/l. Toenails 1-5 b/l thick, discolored, elongated with subungual debris and pain on dorsal palpation.   Hyperkeratotic lesion(s) medial IPJ of right great toe.  No erythema, no edema, no drainage, no fluctuance.  Musculoskeletal Examination: Muscle strength 5/5 to b/l LE. No pain, crepitus or joint limitation noted with ROM bilateral LE. No gross bony deformities bilaterally. Patient ambulates independent of any assistive aids.  Radiographs:  None  Assessment/Plan: 1. Pain due to onychomycosis of toenails of both feet   2. Callus   3. Controlled type 2 diabetes mellitus without complication, without long-term current use of insulin ([place])     No orders of the defined types were placed in this encounter.  -Guardian present with patient today. All questions/concerns addressed on today's visit. -No new findings. No new orders. -Continue supportive shoe gear daily. -Toenails 1-5 b/l were debrided in length and girth with sterile nail nippers without iatrogenic bleeding. Patient declined use of dremel. -Callus(es) right great toe pared utilizing sharp debridement with sterile blade without complication or incident. Total number debrided =1. -Patient/POA to call should there be question/concern in the interim.   Return in about 3 months (around 09/12/2022).  Marzetta Board, DPM

## 2022-06-14 NOTE — Progress Notes (Signed)
Subjective:   By signing my name below, I, Kellie Simmering, attest that this documentation has been prepared under the direction and in the presence of [name], MD., 06/16/2022.   Patient ID: Evan Best, male    DOB: June 10, 1951, 71 y.o.   MRN: [id number]  Chief Complaint  Patient presents with   Follow-up    Follow up   HPI Patient is in today for an office visit. Denies CP/ palpitations/ SOB/ HA/ congestion/ fevers.  Blood Pressure Patient's blood pressure is slightly elevated today. He is currently taking Amlodipine 10 mg. He has recently been consuming more carbohydrates. BP Readings from Last 3 Encounters:  06/16/22 (!) 150/80  06/13/22 (!) 187/95  05/11/22 (!) 177/75   Pulse Readings from Last 3 Encounters:  06/16/22 77  05/11/22 92  01/24/22 79   Bowel Habits He has recently been having irregular bowel movements and trouble emptying his bladder.  Dental He reports that he recently cracked a crown on his tooth while eating hard candy. He has had a replacement but states that his tooth is still sensitive. He suspects that he has an abscess in his mouth.  Depression Patient is battling depression.  Falls He reports that he has had recent falls at home while walking up the stairs and tripping over household objects. He further reports that he fell and hit his left knee, which has been worked on, on the ground while walking his dog. He denies knee pain.  Sleep  He reports that he typically sleeps 6 hours nightly but has been struggling to fall and stay asleep this past week. He takes Melatonin but this has not been providing help.  Weight Loss Patient reports that he has lost weight since his wife recently passed away. He states that his weight used to fluctuate around 215 lbs but now fluctuates around 195 lbs. Body mass index is 26.78 kg/m. HGBA1C is within normal range. He is currently taking Metformin 500 mg three times daily. His most recent high blood  glucose reading was 113 ng/mL. Wt Readings from Last 3 Encounters:  06/16/22 192 lb (87.1 kg)  01/24/22 200 lb (90.7 kg)  10/07/21 210 lb 3.2 oz (95.3 kg)   Lab Results  Component Value Date   HGBA1C 5.5 06/15/2022   Past Medical History:  Diagnosis Date   Allergic state 09/22/2013   Anemia 09/07/2015   Arthritis 03/06/2015   Diabetes mellitus type II 1/09   Dysphagia 05/04/2017   Esophageal reflux 03/06/2015   Gout 09/18/2012   Left big toe   Hypertension    Insomnia 09/13/2015   Migraine headache    remote history   OSA (obstructive sleep apnea)    Preventative health care 09/05/2014   Right shoulder pain 02/19/2009   Qualifier: Diagnosis of  By: Wynona Luna    RLS (restless legs syndrome) 09/07/2015   Sleep apnea    uses cpap   Tremor 09/07/2015   Past Surgical History:  Procedure Laterality Date   HERNIA REPAIR  [id number]   umbilical hernia repair   KNEE SURGERY     left knee surgery   TONSILLECTOMY  1959   VASECTOMY     Family History  Problem Relation Age of Onset   Breast cancer Mother    Hypertension Mother    Hyperlipidemia Father    Kidney disease Father    Heart disease Father    Hypertension Father    Colon cancer Neg Hx  Stomach cancer Neg Hx    Social History   Socioeconomic History   Marital status: Widowed    Spouse name: Not on file   Number of children: 1   Years of education: Not on file   Highest education level: Not on file  Occupational History   Not on file  Tobacco Use   Smoking status: Former    Years: 5.00    Types: Cigarettes    Quit date: 2005    Years since quitting: 18.9   Smokeless tobacco: Never   Tobacco comments:    1 pack per year  Vaping Use   Vaping Use: Never used  Substance and Sexual Activity   Alcohol use: No   Drug use: No   Sexual activity: Yes  Other Topics Concern   Not on file  Social History Narrative   Occupation:  Chemical engineer   Married  -  1 daughter (age 34)    Never Smoked   Alcohol  use-no     Drug use-no          Social Determinants of Health   Financial Resource Strain: Low Risk  (05/11/2021)   Overall Financial Resource Strain (CARDIA)    Difficulty of Paying Living Expenses: Not hard at all  Food Insecurity: No Food Insecurity (05/11/2021)   Hunger Vital Sign    Worried About Running Out of Food in the Last Year: Never true    [place] in the Last Year: Never true  Transportation Needs: No Transportation Needs (05/11/2021)   PRAPARE - Hydrologist (Medical): No    Lack of Transportation (Non-Medical): No  Physical Activity: Inactive (05/11/2021)   Exercise Vital Sign    Days of Exercise per Week: 0 days    Minutes of Exercise per Session: 0 min  Stress: No Stress Concern Present (05/11/2021)   [place]    Feeling of Stress : Not at all  Social Connections: Socially Isolated (05/11/2021)   Social Connection and Isolation Panel [NHANES]    Frequency of Communication with Friends and Family: Once a week    Frequency of Social Gatherings with Friends and Family: Once a week    Attends Religious Services: Never    Marine scientist or Organizations: No    Attends Archivist Meetings: Never    Marital Status: Married  Human resources officer Violence: Not At Risk (05/11/2021)   Humiliation, Afraid, Rape, and Kick questionnaire    Fear of Current or Ex-Partner: No    Emotionally Abused: No    Physically Abused: No    Sexually Abused: No   Outpatient Medications Prior to Visit  Medication Sig Dispense Refill   amLODipine (NORVASC) 10 MG tablet TAKE 1 TABLET BY MOUTH EVERY DAY 90 tablet 1   Calcium-Magnesium-Zinc 1000-400-15 MG TABS Take by mouth daily.     cetirizine (ZYRTEC) 10 MG tablet Take 10 mg by mouth daily.     Chelated Potassium 99 MG TABS Take 99 mg by mouth 2 (two) times daily.     Cholecalciferol (D3 ADULT PO) Take by mouth.     Cinnamon  500 MG capsule Take 500 mg 3 (three) times daily by mouth. +50 mg Chromium in each supplement.     Coenzyme Q10 (CO Q-10) 100 MG CAPS Take 200 mg by mouth daily.     Cyanocobalamin (VITAMIN B 12 PO) Take 1 capsule daily by mouth.  famotidine (PEPCID) 40 MG tablet TAKE 1 TABLET BY MOUTH EVERY DAY 90 tablet 1   ferrous sulfate 325 (65 FE) MG tablet Take 1 tablet (325 mg total) by mouth daily with breakfast. 90 tablet 0   Glucosamine-Chondroitin-Vit D3 1500-1200-800 MG-MG-UNIT PACK Take by mouth.     glucose blood test strip One touch Ultra blue -Check sugars daily-  DX E11.69 100 each 11   lisinopril (ZESTRIL) 40 MG tablet TAKE 1 TABLET BY MOUTH EVERY DAY 90 tablet 1   Melatonin 10 MG TABS Take by mouth.     Misc Natural Products (TART CHERRY ADVANCED PO) Take by mouth.     Omega-3 Fatty Acids (FISH OIL PO) Take 1,500 mg by mouth daily.     OMEPRAZOLE PO      Quercetin 250 MG TABS Take by mouth 2 (two) times daily.      simvastatin (ZOCOR) 10 MG tablet TAKE 1 TABLET BY MOUTH EVERYDAY AT BEDTIME 90 tablet 1   triamcinolone cream (KENALOG) 0.1 % apply     TURMERIC PO Take by mouth.     vitamin C (ASCORBIC ACID) 500 MG tablet Take 500 mg by mouth 3 (three) times daily.     metFORMIN (GLUCOPHAGE) 500 MG tablet TAKE 2 TABLETS BY MOUTH TWICE A DAY WITH MEALS 360 tablet 1   No facility-administered medications prior to visit.   No Known Allergies  Review of Systems  Constitutional:  Negative for chills and fever.  HENT:  Negative for congestion.   Respiratory:  Negative for shortness of breath.   Cardiovascular:  Negative for chest pain and palpitations.  Genitourinary:  Negative for dysuria, frequency, hematuria and urgency.  Musculoskeletal:  Positive for falls.  Skin:           Neurological:  Negative for headaches.  Psychiatric/Behavioral:  Positive for depression.       Objective:    Physical Exam Constitutional:      General: He is not in acute distress.    Appearance: Normal  appearance. He is normal weight. He is not ill-appearing.  HENT:     Head: Normocephalic and atraumatic.     Right Ear: External ear normal.     Left Ear: External ear normal.     Nose: Nose normal.     Mouth/Throat:     Mouth: Mucous membranes are moist.     Pharynx: Oropharynx is clear.  Eyes:     General:        Right eye: No discharge.        Left eye: No discharge.     Extraocular Movements: Extraocular movements intact.     Conjunctiva/sclera: Conjunctivae normal.     Pupils: Pupils are equal, round, and reactive to light.  Cardiovascular:     Rate and Rhythm: Normal rate and regular rhythm.     Pulses: Normal pulses.     Heart sounds: Normal heart sounds. No murmur heard.    No gallop.  Pulmonary:     Effort: Pulmonary effort is normal. No respiratory distress.     Breath sounds: Normal breath sounds. No wheezing or rales.  Abdominal:     General: Bowel sounds are normal.     Palpations: Abdomen is soft.     Tenderness: There is no abdominal tenderness. There is no guarding.  Musculoskeletal:        General: Normal range of motion.     Cervical back: Normal range of motion.     Right lower leg:  No edema.     Left lower leg: No edema.  Skin:    General: Skin is warm and dry.  Neurological:     Mental Status: He is alert and oriented to person, place, and time.  Psychiatric:        Mood and Affect: Mood normal.        Behavior: Behavior normal.        Judgment: Judgment normal.    BP (!) 150/80 (BP Location: Right Arm, Patient Position: Sitting, Cuff Size: Normal)   Pulse 77   Temp 98 F (36.7 C) (Oral)   Resp 16   Ht '5\' 11"'$  (1.803 m)   Wt 192 lb (87.1 kg)   SpO2 99%   BMI 26.78 kg/m  Wt Readings from Last 3 Encounters:  06/16/22 192 lb (87.1 kg)  01/24/22 200 lb (90.7 kg)  10/07/21 210 lb 3.2 oz (95.3 kg)   Diabetic Foot Exam - Simple   No data filed    Lab Results  Component Value Date   WBC 11.6 (H) 06/15/2022   HGB 15.6 06/15/2022   HCT 44.0  06/15/2022   PLT 331.0 06/15/2022   GLUCOSE 115 (H) 06/15/2022   CHOL 131 06/15/2022   TRIG 94.0 06/15/2022   HDL 37.80 (L) 06/15/2022   LDLDIRECT 74.0 08/03/2017   LDLCALC 75 06/15/2022   ALT 16 06/15/2022   AST 17 06/15/2022   NA 140 06/15/2022   K 3.7 06/15/2022   CL 102 06/15/2022   CREATININE 1.13 06/15/2022   BUN 17 06/15/2022   CO2 32 06/15/2022   TSH 3.26 06/15/2022   PSA 0.61 02/08/2018   HGBA1C 5.5 06/15/2022   MICROALBUR 1.66 03/15/2013   Lab Results  Component Value Date   TSH 3.26 06/15/2022   Lab Results  Component Value Date   WBC 11.6 (H) 06/15/2022   HGB 15.6 06/15/2022   HCT 44.0 06/15/2022   MCV 86.1 06/15/2022   PLT 331.0 06/15/2022   Lab Results  Component Value Date   NA 140 06/15/2022   K 3.7 06/15/2022   CO2 32 06/15/2022   GLUCOSE 115 (H) 06/15/2022   BUN 17 06/15/2022   CREATININE 1.13 06/15/2022   BILITOT 0.6 06/15/2022   ALKPHOS 50 06/15/2022   AST 17 06/15/2022   ALT 16 06/15/2022   PROT 7.2 06/15/2022   ALBUMIN 4.4 06/15/2022   CALCIUM 9.2 06/15/2022   GFR 65.85 06/15/2022   Lab Results  Component Value Date   CHOL 131 06/15/2022   Lab Results  Component Value Date   HDL 37.80 (L) 06/15/2022   Lab Results  Component Value Date   LDLCALC 75 06/15/2022   Lab Results  Component Value Date   TRIG 94.0 06/15/2022   Lab Results  Component Value Date   CHOLHDL 3 06/15/2022   Lab Results  Component Value Date   HGBA1C 5.5 06/15/2022      Assessment & Plan:   Colonoscopy Last completed on 07/07/2017.  -Two 2 to 3 mm polyps at the hepatic flexure and in the cecum, removed with a cold snare. Resected and retrieved. -Previous injury to the colon at splenic flexure, remote ischemic colitis. -The examination was otherwise normal on direct and retroflexion views. Referral to GI has been made today.  Diet Patient has been advised to increase his protein intake.  Immunizations Patient has been informed about  receiving COVID-19, Influenza, and RSV immunizations. Immunization History  Administered Date(s) Administered   Fluad Quad(high Dose 65+) 04/24/2019,  06/01/2020   Influenza Split 05/13/2011, 05/11/2012   Influenza Whole 05/13/2009, 05/14/2010, 04/10/2013   Influenza, High Dose Seasonal PF 05/04/2017   Influenza-Unspecified 04/11/2014, 04/17/2015, 04/10/2018   PFIZER(Purple Top)SARS-COV-2 Vaccination 10/18/2019, 11/12/2019   Pneumococcal Conjugate-13 05/04/2017   Pneumococcal Polysaccharide-23 05/24/2018   Tdap 06/21/2011   Zoster Recombinat (Shingrix) 02/15/2018, 07/09/2018   Sleep Patient has been advised to take Magnesium Glycinate, L-Tryptophan, and tart cherry juice to help him sleep.  Problem List Items Addressed This Visit     Diabetes mellitus type 2 in obese ([place]) - Primary    hgba1c acceptable, minimize simple carbs. Increase exercise as tolerated. Continue current meds      Relevant Medications   metFORMIN (GLUCOPHAGE) 500 MG tablet   Depression    Still adjusting to the loss of his wife. His daughter is with him in this process and he is managing most days although he does endorse being somewhat more irritable not having his wife to talk with. No medication adjustments at this time.       Other Visit Diagnoses     H/O colonoscopy with polypectomy       Relevant Orders   Ambulatory referral to Gastroenterology   Colon cancer screening       Relevant Orders   Ambulatory referral to Gastroenterology      Meds ordered this encounter  Medications   metFORMIN (GLUCOPHAGE) 500 MG tablet    Sig: Take 1 tablet (500 mg total) by mouth 3 (three) times daily.    Dispense:  360 tablet    Refill:  1   I, Penni Homans, MD, personally preformed the services described in this documentation.  All medical record entries made by the scribe were at my direction and in my presence.  I have reviewed the chart and discharge instructions (if applicable) and agree that the record  reflects my personal performance and is accurate and complete. 06/16/2022  I,Mohammed Iqbal,acting as a scribe for Penni Homans, MD.,have documented all relevant documentation on the behalf of Penni Homans, MD,as directed by  Penni Homans, MD while in the presence of Penni Homans, MD.  Penni Homans, MD

## 2022-06-15 ENCOUNTER — Other Ambulatory Visit (INDEPENDENT_AMBULATORY_CARE_PROVIDER_SITE_OTHER): Payer: Medicare Other

## 2022-06-15 DIAGNOSIS — E1169 Type 2 diabetes mellitus with other specified complication: Secondary | ICD-10-CM

## 2022-06-15 DIAGNOSIS — E782 Mixed hyperlipidemia: Secondary | ICD-10-CM

## 2022-06-15 DIAGNOSIS — E669 Obesity, unspecified: Secondary | ICD-10-CM | POA: Diagnosis not present

## 2022-06-15 LAB — CBC WITH DIFFERENTIAL/PLATELET
Basophils Absolute: 0.1 10*3/uL (ref 0.0–0.1)
Basophils Relative: 1.1 % (ref 0.0–3.0)
Eosinophils Absolute: 0.5 10*3/uL (ref 0.0–0.7)
Eosinophils Relative: 4.4 % (ref 0.0–5.0)
HCT: 44 % (ref 39.0–52.0)
Hemoglobin: 15.6 g/dL (ref 13.0–17.0)
Lymphocytes Relative: 23.2 % (ref 12.0–46.0)
Lymphs Abs: 2.7 10*3/uL (ref 0.7–4.0)
MCHC: 35.4 g/dL (ref 30.0–36.0)
MCV: 86.1 fl (ref 78.0–100.0)
Monocytes Absolute: 1.1 10*3/uL — ABNORMAL HIGH (ref 0.1–1.0)
Monocytes Relative: 9.7 % (ref 3.0–12.0)
Neutro Abs: 7.1 10*3/uL (ref 1.4–7.7)
Neutrophils Relative %: 61.6 % (ref 43.0–77.0)
Platelets: 331 10*3/uL (ref 150.0–400.0)
RBC: 5.11 Mil/uL (ref 4.22–5.81)
RDW: 13.1 % (ref 11.5–15.5)
WBC: 11.6 10*3/uL — ABNORMAL HIGH (ref 4.0–10.5)

## 2022-06-15 LAB — COMPREHENSIVE METABOLIC PANEL
ALT: 16 U/L (ref 0–53)
AST: 17 U/L (ref 0–37)
Albumin: 4.4 g/dL (ref 3.5–5.2)
Alkaline Phosphatase: 50 U/L (ref 39–117)
BUN: 17 mg/dL (ref 6–23)
CO2: 32 mEq/L (ref 19–32)
Calcium: 9.2 mg/dL (ref 8.4–10.5)
Chloride: 102 mEq/L (ref 96–112)
Creatinine, Ser: 1.13 mg/dL (ref 0.40–1.50)
GFR: 65.85 mL/min (ref >60.00)
Glucose, Bld: 115 mg/dL — ABNORMAL HIGH (ref 70–99)
Potassium: 3.7 mEq/L (ref 3.5–5.1)
Sodium: 140 mEq/L (ref 135–145)
Total Bilirubin: 0.6 mg/dL (ref 0.2–1.2)
Total Protein: 7.2 g/dL (ref 6.0–8.3)

## 2022-06-15 LAB — LIPID PANEL
Cholesterol: 131 mg/dL (ref 0–200)
HDL: 37.8 mg/dL — ABNORMAL LOW (ref >39.00)
LDL Cholesterol: 75 mg/dL (ref 0–99)
NonHDL: 93.4
Total CHOL/HDL Ratio: 3
Triglycerides: 94 mg/dL (ref 0.0–149.0)
VLDL: 18.8 mg/dL (ref 0.0–40.0)

## 2022-06-15 LAB — TSH: TSH: 3.26 u[IU]/mL (ref 0.35–5.50)

## 2022-06-15 LAB — HEMOGLOBIN A1C: Hgb A1c MFr Bld: 5.5 % (ref 4.6–6.5)

## 2022-06-15 NOTE — Assessment & Plan Note (Signed)
hgba1c acceptable, minimize simple carbs. Increase exercise as tolerated. Continue current meds 

## 2022-06-16 ENCOUNTER — Ambulatory Visit (INDEPENDENT_AMBULATORY_CARE_PROVIDER_SITE_OTHER): Payer: Medicare Other | Admitting: Family Medicine

## 2022-06-16 VITALS — BP 150/80 | HR 77 | Temp 98.0°F | Resp 16 | Ht 71.0 in | Wt 192.0 lb

## 2022-06-16 DIAGNOSIS — Z1211 Encounter for screening for malignant neoplasm of colon: Secondary | ICD-10-CM

## 2022-06-16 DIAGNOSIS — E1169 Type 2 diabetes mellitus with other specified complication: Secondary | ICD-10-CM

## 2022-06-16 DIAGNOSIS — F32A Depression, unspecified: Secondary | ICD-10-CM | POA: Diagnosis not present

## 2022-06-16 DIAGNOSIS — E669 Obesity, unspecified: Secondary | ICD-10-CM

## 2022-06-16 DIAGNOSIS — Z86010 Personal history of colonic polyps: Secondary | ICD-10-CM

## 2022-06-16 DIAGNOSIS — Z9889 Other specified postprocedural states: Secondary | ICD-10-CM

## 2022-06-16 MED ORDER — METFORMIN HCL 500 MG PO TABS: 500.0000 mg | ORAL_TABLET | Freq: Three times a day (TID) | ORAL | 1 refills | Status: DC

## 2022-06-16 NOTE — Patient Instructions (Addendum)
Magnesium Glycinate 200-400 mg at bedtime L Tryptophan capsules or warm milk with honey  Tart Cherry Juice in evening   RSV (respiratory syncitial virus) vaccine at pharmacy. Arexvy Covid booster at pharmacy High dose flu shot  Need Tetanus booster

## 2022-06-17 ENCOUNTER — Encounter: Payer: Self-pay | Admitting: Gastroenterology

## 2022-06-18 NOTE — Assessment & Plan Note (Signed)
Still adjusting to the loss of his wife. His daughter is with him in this process and he is managing most days although he does endorse being somewhat more irritable not having his wife to talk with. No medication adjustments at this time.

## 2022-07-06 ENCOUNTER — Other Ambulatory Visit: Payer: Self-pay | Admitting: Family Medicine

## 2022-07-06 DIAGNOSIS — E611 Iron deficiency: Secondary | ICD-10-CM

## 2022-07-10 ENCOUNTER — Other Ambulatory Visit: Payer: Self-pay | Admitting: Family Medicine

## 2022-07-14 ENCOUNTER — Ambulatory Visit (AMBULATORY_SURGERY_CENTER): Payer: Medicare Other

## 2022-07-14 VITALS — Ht 71.0 in | Wt 195.0 lb

## 2022-07-14 DIAGNOSIS — Z86010 Personal history of colonic polyps: Secondary | ICD-10-CM

## 2022-07-14 MED ORDER — NA SULFATE-K SULFATE-MG SULF 17.5-3.13-1.6 GM/177ML PO SOLN: 1.0000 | Freq: Once | ORAL | 0 refills | Status: AC

## 2022-07-14 NOTE — Progress Notes (Signed)

## 2022-07-19 ENCOUNTER — Ambulatory Visit (INDEPENDENT_AMBULATORY_CARE_PROVIDER_SITE_OTHER): Payer: Medicare Other | Admitting: Family Medicine

## 2022-07-19 VITALS — BP 142/80 | HR 73 | Wt 198.8 lb

## 2022-07-19 DIAGNOSIS — I10 Essential (primary) hypertension: Secondary | ICD-10-CM

## 2022-07-19 DIAGNOSIS — R634 Abnormal weight loss: Secondary | ICD-10-CM

## 2022-07-19 NOTE — Progress Notes (Signed)
Pt here for Blood pressure check and weight check per Dr. Charlett Blake  Pt currently takes:amlodipine '10mg'$  and lisinopril '40mg'$   Pt reports compliance with medication.  Pt has not check bp at home because cuff he does not think cuff is reliable.  No chest pains, no SOB, and no dizziness.  Has not taken medications today but will take after he gets some breakfast.  Did have abscess in teeth and he had to have root canal last week.  BP Readings from Last 3 Encounters:  06/16/22 (!) 150/80  06/13/22 (!) 187/95  05/11/22 (!) 177/75    BP today 1106am left arm 152/88  1115am right arm 148/86 Recheck 1119am left arm 150/80 11/21am right arm 142/80   Wt Readings from Last 3 Encounters:  07/14/22 195 lb (88.5 kg)  06/16/22 192 lb (87.1 kg)  01/24/22 200 lb (90.7 kg)   Wt today: 198lb  Patient advised per Dr. Charlett Blake that weight looks stable and that blood pressure is still elevated.  Need to return in one month to recheck blood pressure later in the morning after taking medications.  Also advised that if he able to keep a log and bring in readings and blood pressure cuff at next nurse visit.

## 2022-08-02 ENCOUNTER — Encounter: Payer: Self-pay | Admitting: Gastroenterology

## 2022-08-02 ENCOUNTER — Ambulatory Visit (AMBULATORY_SURGERY_CENTER): Payer: Medicare Other | Admitting: Gastroenterology

## 2022-08-02 VITALS — BP 133/68 | HR 72 | Temp 97.8°F | Resp 13 | Ht 71.0 in | Wt 195.0 lb

## 2022-08-02 DIAGNOSIS — D123 Benign neoplasm of transverse colon: Secondary | ICD-10-CM | POA: Diagnosis not present

## 2022-08-02 DIAGNOSIS — K635 Polyp of colon: Secondary | ICD-10-CM

## 2022-08-02 DIAGNOSIS — Z86010 Personal history of colonic polyps: Secondary | ICD-10-CM

## 2022-08-02 DIAGNOSIS — G4733 Obstructive sleep apnea (adult) (pediatric): Secondary | ICD-10-CM | POA: Diagnosis not present

## 2022-08-02 DIAGNOSIS — Z09 Encounter for follow-up examination after completed treatment for conditions other than malignant neoplasm: Secondary | ICD-10-CM

## 2022-08-02 DIAGNOSIS — E119 Type 2 diabetes mellitus without complications: Secondary | ICD-10-CM | POA: Diagnosis not present

## 2022-08-02 DIAGNOSIS — G2581 Restless legs syndrome: Secondary | ICD-10-CM | POA: Diagnosis not present

## 2022-08-02 MED ORDER — SODIUM CHLORIDE 0.9 % IV SOLN: 500.0000 mL | INTRAVENOUS | Status: DC

## 2022-08-02 NOTE — Progress Notes (Unsigned)
Called to room to assist during endoscopic procedure.  Patient ID and intended procedure confirmed with present staff. Received instructions for my participation in the procedure from the performing physician.  

## 2022-08-02 NOTE — Progress Notes (Unsigned)
GASTROENTEROLOGY PROCEDURE H&P NOTE   Primary Care Physician: [name], MD  HPI: Evan Best is a 72 y.o. male who presents for Colonoscopy for surveillance in setting of previous TAs.  Past Medical History:  Diagnosis Date   Allergic state 09/22/2013   Anemia 09/07/2015   Arthritis 03/06/2015   Diabetes mellitus type II 1/09   Dysphagia 05/04/2017   Esophageal reflux 03/06/2015   Gout 09/18/2012   Left big toe   Hypertension    Insomnia 09/13/2015   Migraine headache    remote history   OSA (obstructive sleep apnea)    Preventative health care 09/05/2014   Right shoulder pain 02/19/2009   Qualifier: Diagnosis of  By: Wynona Luna    RLS (restless legs syndrome) 09/07/2015   Sleep apnea    uses cpap   Tremor 09/07/2015   Past Surgical History:  Procedure Laterality Date   HERNIA REPAIR  6440   umbilical hernia repair   KNEE SURGERY     left knee surgery   TONSILLECTOMY  1959   VASECTOMY     Current Outpatient Medications  Medication Sig Dispense Refill   amLODipine (NORVASC) 10 MG tablet TAKE 1 TABLET BY MOUTH EVERY DAY 90 tablet 1   Calcium-Magnesium-Zinc 1000-400-15 MG TABS Take by mouth daily.     cetirizine (ZYRTEC) 10 MG tablet Take 10 mg by mouth daily.     Chelated Potassium 99 MG TABS Take 99 mg by mouth 2 (two) times daily.     Cholecalciferol (D3 ADULT PO) Take by mouth.     Cinnamon 500 MG capsule Take 500 mg 3 (three) times daily by mouth. +50 mg Chromium in each supplement.     Coenzyme Q10 (CO Q-10) 100 MG CAPS Take 200 mg by mouth daily.     Cyanocobalamin (VITAMIN B 12 PO) Take 1 capsule daily by mouth.     famotidine (PEPCID) 40 MG tablet TAKE 1 TABLET BY MOUTH EVERY DAY 90 tablet 1   Glucosamine-Chondroitin-Vit D3 1500-1200-800 MG-MG-UNIT PACK Take by mouth.     lisinopril (ZESTRIL) 40 MG tablet TAKE 1 TABLET BY MOUTH EVERY DAY 90 tablet 1   Melatonin 10 MG TABS Take by mouth.     metFORMIN (GLUCOPHAGE) 500 MG tablet Take 1 tablet (500  mg total) by mouth 3 (three) times daily. 360 tablet 1   Misc Natural Products (TART CHERRY ADVANCED PO) Take by mouth.     Omega-3 Fatty Acids (FISH OIL PO) Take 1,500 mg by mouth daily.     OMEPRAZOLE PO      Quercetin 250 MG TABS Take by mouth 2 (two) times daily.      simvastatin (ZOCOR) 10 MG tablet TAKE 1 TABLET BY MOUTH EVERYDAY AT BEDTIME 90 tablet 1   TURMERIC PO Take by mouth.     vitamin C (ASCORBIC ACID) 500 MG tablet Take 500 mg by mouth 3 (three) times daily.     ferrous sulfate 325 (65 FE) MG tablet TAKE 1 TABLET BY MOUTH EVERY DAY WITH BREAKFAST 90 tablet 0   glucose blood test strip One touch Ultra blue -Check sugars daily-  DX E11.69 100 each 11   triamcinolone cream (KENALOG) 0.1 % apply (Patient not taking: Reported on 07/14/2022)     Current Facility-Administered Medications  Medication Dose Route Frequency Provider Last Rate Last Admin   0.9 %  sodium chloride infusion  500 mL Intravenous Continuous Mansouraty, Telford Nab., MD  Current Outpatient Medications:    amLODipine (NORVASC) 10 MG tablet, TAKE 1 TABLET BY MOUTH EVERY DAY, Disp: 90 tablet, Rfl: 1   Calcium-Magnesium-Zinc 1000-400-15 MG TABS, Take by mouth daily., Disp: , Rfl:    cetirizine (ZYRTEC) 10 MG tablet, Take 10 mg by mouth daily., Disp: , Rfl:    Chelated Potassium 99 MG TABS, Take 99 mg by mouth 2 (two) times daily., Disp: , Rfl:    Cholecalciferol (D3 ADULT PO), Take by mouth., Disp: , Rfl:    Cinnamon 500 MG capsule, Take 500 mg 3 (three) times daily by mouth. +50 mg Chromium in each supplement., Disp: , Rfl:    Coenzyme Q10 (CO Q-10) 100 MG CAPS, Take 200 mg by mouth daily., Disp: , Rfl:    Cyanocobalamin (VITAMIN B 12 PO), Take 1 capsule daily by mouth., Disp: , Rfl:    famotidine (PEPCID) 40 MG tablet, TAKE 1 TABLET BY MOUTH EVERY DAY, Disp: 90 tablet, Rfl: 1   Glucosamine-Chondroitin-Vit D3 1500-1200-800 MG-MG-UNIT PACK, Take by mouth., Disp: , Rfl:    lisinopril (ZESTRIL) 40 MG tablet,  TAKE 1 TABLET BY MOUTH EVERY DAY, Disp: 90 tablet, Rfl: 1   Melatonin 10 MG TABS, Take by mouth., Disp: , Rfl:    metFORMIN (GLUCOPHAGE) 500 MG tablet, Take 1 tablet (500 mg total) by mouth 3 (three) times daily., Disp: 360 tablet, Rfl: 1   Misc Natural Products (TART CHERRY ADVANCED PO), Take by mouth., Disp: , Rfl:    Omega-3 Fatty Acids (FISH OIL PO), Take 1,500 mg by mouth daily., Disp: , Rfl:    OMEPRAZOLE PO, , Disp: , Rfl:    Quercetin 250 MG TABS, Take by mouth 2 (two) times daily. , Disp: , Rfl:    simvastatin (ZOCOR) 10 MG tablet, TAKE 1 TABLET BY MOUTH EVERYDAY AT BEDTIME, Disp: 90 tablet, Rfl: 1   TURMERIC PO, Take by mouth., Disp: , Rfl:    vitamin C (ASCORBIC ACID) 500 MG tablet, Take 500 mg by mouth 3 (three) times daily., Disp: , Rfl:    ferrous sulfate 325 (65 FE) MG tablet, TAKE 1 TABLET BY MOUTH EVERY DAY WITH BREAKFAST, Disp: 90 tablet, Rfl: 0   glucose blood test strip, One touch Ultra blue -Check sugars daily-  DX E11.69, Disp: 100 each, Rfl: 11   triamcinolone cream (KENALOG) 0.1 %, apply (Patient not taking: Reported on 07/14/2022), Disp: , Rfl:   Current Facility-Administered Medications:    0.9 %  sodium chloride infusion, 500 mL, Intravenous, Continuous, Mansouraty, Telford Nab., MD No Known Allergies Family History  Problem Relation Age of Onset   Breast cancer Mother    Hypertension Mother    Hyperlipidemia Father    Kidney disease Father    Heart disease Father    Hypertension Father    Colon cancer Neg Hx    Stomach cancer Neg Hx    Colon polyps Neg Hx    Esophageal cancer Neg Hx    Rectal cancer Neg Hx    Social History   Socioeconomic History   Marital status: Widowed    Spouse name: Not on file   Number of children: 1   Years of education: Not on file   Highest education level: Not on file  Occupational History   Not on file  Tobacco Use   Smoking status: Former    Years: 5.00    Types: Cigarettes    Quit date: 2005    Years since quitting:  19.0   Smokeless  tobacco: Never   Tobacco comments:    1 pack per year  Vaping Use   Vaping Use: Never used  Substance and Sexual Activity   Alcohol use: No   Drug use: No   Sexual activity: Yes  Other Topics Concern   Not on file  Social History Narrative   Occupation:  Chemical engineer   Married  -  1 daughter (age 38)    Never Smoked   Alcohol use-no     Drug use-no          Social Determinants of Health   Financial Resource Strain: Low Risk  (05/11/2021)   Overall Financial Resource Strain (CARDIA)    Difficulty of Paying Living Expenses: Not hard at all  Food Insecurity: No Food Insecurity (05/11/2021)   Hunger Vital Sign    Worried About Running Out of Food in the Last Year: Never true    Ran Out of Food in the Last Year: Never true  Transportation Needs: No Transportation Needs (05/11/2021)   PRAPARE - Hydrologist (Medical): No    Lack of Transportation (Non-Medical): No  Physical Activity: Inactive (05/11/2021)   Exercise Vital Sign    Days of Exercise per Week: 0 days    Minutes of Exercise per Session: 0 min  Stress: No Stress Concern Present (05/11/2021)   [place]    Feeling of Stress : Not at all  Social Connections: Socially Isolated (05/11/2021)   Social Connection and Isolation Panel [NHANES]    Frequency of Communication with Friends and Family: Once a week    Frequency of Social Gatherings with Friends and Family: Once a week    Attends Religious Services: Never    Marine scientist or Organizations: No    Attends Archivist Meetings: Never    Marital Status: Married  Human resources officer Violence: Not At Risk (05/11/2021)   Humiliation, Afraid, Rape, and Kick questionnaire    Fear of Current or Ex-Partner: No    Emotionally Abused: No    Physically Abused: No    Sexually Abused: No    Physical Exam: Today's Vitals   08/02/22 1526  08/02/22 1527  BP: (!) 170/99   Pulse: 83   Temp: 97.8 F (36.6 C) 97.8 F (36.6 C)  SpO2: 100%   Weight: 195 lb (88.5 kg)   Height: '5\' 11"'$  (1.803 m)    Body mass index is 27.2 kg/m. GEN: NAD EYE: Sclerae anicteric ENT: MMM CV: Non-tachycardic GI: Soft, NT/ND NEURO:  Alert & Oriented x 3  Lab Results: No results for input(s): "WBC", "HGB", "HCT", "PLT" in the last 72 hours. BMET No results for input(s): "NA", "K", "CL", "CO2", "GLUCOSE", "BUN", "CREATININE", "CALCIUM" in the last 72 hours. LFT No results for input(s): "PROT", "ALBUMIN", "AST", "ALT", "ALKPHOS", "BILITOT", "BILIDIR", "IBILI" in the last 72 hours. PT/INR No results for input(s): "LABPROT", "INR" in the last 72 hours.   Impression / Plan: This is a 72 y.o.male who presents for Colonoscopy for surveillance in setting of previous TAs.  The risks and benefits of endoscopic evaluation/treatment were discussed with the patient and/or family; these include but are not limited to the risk of perforation, infection, bleeding, missed lesions, lack of diagnosis, severe illness requiring hospitalization, as well as anesthesia and sedation related illnesses.  The patient's history has been reviewed, patient examined, no change in status, and deemed stable for procedure.  The patient and/or family  is agreeable to proceed.    Evan Britain, MD [facility] Office # [phone]

## 2022-08-02 NOTE — Progress Notes (Unsigned)
Pt's states no medical or surgical changes since previsit or office visit. 

## 2022-08-02 NOTE — Patient Instructions (Signed)
Discharge instructions given. Handouts on polyps,diverticulosis and Hemorrhoids. Resume previous medications. YOU HAD AN ENDOSCOPIC PROCEDURE TODAY AT THE [facility]:   Refer to the procedure report that was given to you for any specific questions about what was found during the examination.  If the procedure report does not answer your questions, please call your gastroenterologist to clarify.  If you requested that your care partner not be given the details of your procedure findings, then the procedure report has been included in a sealed envelope for you to review at your convenience later.  YOU SHOULD EXPECT: Some feelings of bloating in the abdomen. Passage of more gas than usual.  Walking can help get rid of the air that was put into your GI tract during the procedure and reduce the bloating. If you had a lower endoscopy (such as a colonoscopy or flexible sigmoidoscopy) you may notice spotting of blood in your stool or on the toilet paper. If you underwent a bowel prep for your procedure, you may not have a normal bowel movement for a few days.  Please Note:  You might notice some irritation and congestion in your nose or some drainage.  This is from the oxygen used during your procedure.  There is no need for concern and it should clear up in a day or so.  SYMPTOMS TO REPORT IMMEDIATELY:  Following lower endoscopy (colonoscopy or flexible sigmoidoscopy):  Excessive amounts of blood in the stool  Significant tenderness or worsening of abdominal pains  Swelling of the abdomen that is new, acute  Fever of 100F or higher   For urgent or emergent issues, a gastroenterologist can be reached at any hour by calling [phone]. Do not use MyChart messaging for urgent concerns.    DIET:  We do recommend a small meal at first, but then you may proceed to your regular diet.  Drink plenty of fluids but you should avoid alcoholic beverages for 24 hours.  ACTIVITY:  You should  plan to take it easy for the rest of today and you should NOT DRIVE or use heavy machinery until tomorrow (because of the sedation medicines used during the test).    FOLLOW UP: Our staff will call the number listed on your records the next business day following your procedure.  We will call around 7:15- 8:00 am to check on you and address any questions or concerns that you may have regarding the information given to you following your procedure. If we do not reach you, we will leave a message.     If any biopsies were taken you will be contacted by phone or by letter within the next 1-3 weeks.  Please call us at [phone] if you have not heard about the biopsies in 3 weeks.    SIGNATURES/CONFIDENTIALITY: You and/or your care partner have signed paperwork which will be entered into your electronic medical record.  These signatures attest to the fact that that the information above on your After Visit Summary has been reviewed and is understood.  Full responsibility of the confidentiality of this discharge information lies with you and/or your care-partner. 

## 2022-08-02 NOTE — Op Note (Addendum)
[facility] Patient Name: Evan Best Procedure Date: 08/02/2022 3:55 PM MRN: [id number] Endoscopist: Justice Britain , MD, [id number] Age: 72 Referring MD:  Date of Birth: 1951/04/22 Gender: Male Account #: [id number] Procedure:                Colonoscopy Indications:              Surveillance: Personal history of adenomatous                            polyps on last colonoscopy > 5 years ago Medicines:                Monitored Anesthesia Care Procedure:                Pre-Anesthesia Assessment:                           - Prior to the procedure, a History and Physical                            was performed, and patient medications and                            allergies were reviewed. The patient's tolerance of                            previous anesthesia was also reviewed. The risks                            and benefits of the procedure and the sedation                            options and risks were discussed with the patient.                            All questions were answered, and informed consent                            was obtained. Prior Anticoagulants: The patient has                            taken no anticoagulant or antiplatelet agents. ASA                            Grade Assessment: II - A patient with mild systemic                            disease. After reviewing the risks and benefits,                            the patient was deemed in satisfactory condition to                            undergo the procedure.  After obtaining informed consent, the colonoscope                            was passed under direct vision. Throughout the                            procedure, the patient's blood pressure, pulse, and                            oxygen saturations were monitored continuously. The                            CF [id number] #[id number] was introduced through the anus                            and advanced to  the the cecum, identified by                            appendiceal orifice and ileocecal valve. The                            colonoscopy was performed without difficulty. The                            patient tolerated the procedure. The quality of the                            bowel preparation was inadequate. The ileocecal                            valve, appendiceal orifice, and rectum were                            photographed. Scope In: 4:13:19 PM Scope Out: 4:30:57 PM Scope Withdrawal Time: 0 hours 11 minutes 35 seconds  Total Procedure Duration: 0 hours 17 minutes 38 seconds  Findings:                 Skin tags were found on perianal exam.                           The digital rectal exam findings include                            hemorrhoids. Pertinent negatives include no                            palpable rectal lesions.                           Copious quantities of semi-liquid/semi-solid stool                            was found in the entire colon, interfering with  visualization. Lavage of the area was performed                            using copious amounts, resulting in incomplete                            clearance with only fair visualization.                           A 5 mm polyp was found in the hepatic flexure. The                            polyp was sessile. The polyp was removed with a                            cold snare. Resection and retrieval were complete.                           Multiple small-mouthed diverticula were found in                            the recto-sigmoid colon and sigmoid colon.                           Non-bleeding non-thrombosed external and internal                            hemorrhoids were found during retroflexion, during                            perianal exam and during digital exam. The                            hemorrhoids were Grade II (internal hemorrhoids                             that prolapse but reduce spontaneously). Complications:            No immediate complications. Estimated Blood Loss:     Estimated blood loss was minimal. Impression:               - Perianal skin tags found on perianal exam.                            Hemorrhoids found on digital rectal exam.                           - Preparation of the colon was inadequate even                            after extensive/copious lavage.                           - One 5 mm polyp at the hepatic flexure, removed  with a cold snare. Resected and retrieved.                           - Diverticulosis in the recto-sigmoid colon and in                            the sigmoid colon.                           - Non-bleeding non-thrombosed external and internal                            hemorrhoids. Recommendation:           - The patient will be observed post-procedure,                            until all discharge criteria are met.                           - Discharge patient to home.                           - Patient has a contact number available for                            emergencies. The signs and symptoms of potential                            delayed complications were discussed with the                            patient. Return to normal activities tomorrow.                            Written discharge instructions were provided to the                            patient.                           - High fiber diet.                           - Use FiberCon 1-2 tablets PO daily.                           - Continue present medications.                           - Await pathology results.                           - Repeat colonoscopy in 6-12 months because the                            bowel preparation was poor and polyps/lesions could  have been missed. Will need a 2-day preparation.                            Patient and I discussed this  and he is adament that                            he would not do a 2-day preparation. We decided,                            that stopping his Iron for 2-weeks and then doing                            Miralax every other day for 1 week and with                            Dulcolax 2-days before his true 1-day preparation                            would be a reasonable alternative.                           - The findings and recommendations were discussed                            with the patient.                           - The findings and recommendations were discussed                            with the patient's family. Justice Britain, MD 08/02/2022 4:37:37 PM

## 2022-08-03 ENCOUNTER — Telehealth: Payer: Self-pay

## 2022-08-03 NOTE — Telephone Encounter (Signed)
Post procedure follow up call, no answer 

## 2022-08-06 ENCOUNTER — Encounter: Payer: Self-pay | Admitting: Gastroenterology

## 2022-08-23 ENCOUNTER — Ambulatory Visit (INDEPENDENT_AMBULATORY_CARE_PROVIDER_SITE_OTHER): Payer: Medicare Other | Admitting: Family Medicine

## 2022-08-23 VITALS — BP 148/78 | HR 79

## 2022-08-23 DIAGNOSIS — I10 Essential (primary) hypertension: Secondary | ICD-10-CM | POA: Diagnosis not present

## 2022-08-23 MED ORDER — HYDROCHLOROTHIAZIDE 25 MG PO TABS: 25.0000 mg | ORAL_TABLET | Freq: Every day | ORAL | 3 refills | Status: DC

## 2022-08-23 NOTE — Progress Notes (Signed)
Pt here for Blood pressure check per Dr. Charlett Blake.  Pt currently takes: amlodipine 34m and lisinopril 459m  Pt reports compliance with medication.  BP Readings from Last 3 Encounters:  08/02/22 133/68  07/19/22 (!) 142/80  06/16/22 (!) 150/80   Has been checking at home and has been high. Has taken medication today.  Pt brought in his machine today Left arm 169/86 HR 81  BP today Right arm  158/80 HR 85 Left arm 156/80 HR 80  Recheck Right arm 150/78 HR 77 Left arm 148/78 HR 79  Pt advised per Dr. BlCharlett Blakehat blood pressure is still high and we need to add hctz 2512mnd recheck cmp and blood pressure in one month.  Pt has appointment with Dr. BlyCharlett Blake 09/12/22 and he will recheck at that time.  Prescription sent in.

## 2022-09-11 NOTE — Assessment & Plan Note (Signed)
Hydrate and monitor 

## 2022-09-11 NOTE — Assessment & Plan Note (Signed)
Tolerating statin, encouraged heart healthy diet, avoid trans fats, minimize simple carbs and saturated fats. Increase exercise as tolerated 

## 2022-09-12 ENCOUNTER — Telehealth: Payer: Self-pay | Admitting: Family Medicine

## 2022-09-12 ENCOUNTER — Ambulatory Visit (INDEPENDENT_AMBULATORY_CARE_PROVIDER_SITE_OTHER): Payer: Medicare Other | Admitting: Family Medicine

## 2022-09-12 VITALS — BP 150/80 | HR 81 | Temp 98.0°F | Resp 16 | Ht 71.0 in | Wt 200.6 lb

## 2022-09-12 DIAGNOSIS — E782 Mixed hyperlipidemia: Secondary | ICD-10-CM | POA: Diagnosis not present

## 2022-09-12 DIAGNOSIS — E669 Obesity, unspecified: Secondary | ICD-10-CM | POA: Diagnosis not present

## 2022-09-12 DIAGNOSIS — I10 Essential (primary) hypertension: Secondary | ICD-10-CM | POA: Diagnosis not present

## 2022-09-12 DIAGNOSIS — E1169 Type 2 diabetes mellitus with other specified complication: Secondary | ICD-10-CM | POA: Diagnosis not present

## 2022-09-12 DIAGNOSIS — M1A9XX0 Chronic gout, unspecified, without tophus (tophi): Secondary | ICD-10-CM

## 2022-09-12 DIAGNOSIS — R252 Cramp and spasm: Secondary | ICD-10-CM

## 2022-09-12 NOTE — Patient Instructions (Addendum)
Consider RSV, Respiratory Syncitial Virus Vaccine, Arexvy at pharmacy   Tetanus shot due at pharmacy

## 2022-09-12 NOTE — Telephone Encounter (Signed)
Pt would like his labs competed at [facility], so orders will need to be switched.

## 2022-09-12 NOTE — Progress Notes (Signed)
Subjective:   By signing my name below, I, Evan Best, attest that this documentation has been prepared under the direction and in the presence of Evan Lukes, MD. 09/12/2022   Patient ID: Evan Best, male    DOB: Nov 04, 1950, 72 y.o.   MRN: [id number]  Chief Complaint  Patient presents with   Follow-up    Follow up    HPI Patient is in today for a follow-up appointment.  Blood pressure His blood pressure is elevated during this visit.  BP Readings from Last 3 Encounters:  09/12/22 (!) 150/80  08/23/22 (!) 148/78  08/02/22 133/68     Blood sugar  His blood sugar is consistently around 130. His fasting blood sugar was 178 yesterday.    Urination  He is urinating more frequently than previously. He denies burning upon urination.  Sleep  He sleeps five to six hours per night. He takes short naps in the afternoon.  Colonoscopy  Last colonoscopy on 08/02/2022. Skin tags found on the perianal exam. A 5 mm polyp found in the hepatic flexure. Non-bleeding non-thrombosed external and internal hemorrhoids found. Visualization interfered by stool. Repeat in 6-12 months.   Weight His weight has gone up since his last visit. He has been more relaxed with his diet since his colonoscopy.  Wt Readings from Last 3 Encounters:  09/12/22 200 lb 9.6 oz (91 kg)  08/02/22 195 lb (88.5 kg)  07/19/22 198 lb 12.8 oz (90.2 kg)     Diet He eats protein cereal and almonds as his protein intake. He eats apricots for added fiber.   Social History  His wife passed away recently. He donates his platelets and plasma to the [facility] cross. His hemoglobin was slightly low during his last donation.   Podiatry  He is following up with podiatry for a callus on his right foot.   Past Medical History:  Diagnosis Date   Allergic state 09/22/2013   Anemia 09/07/2015   Arthritis 03/06/2015   Diabetes mellitus type II 1/09   Dysphagia 05/04/2017   Esophageal reflux 03/06/2015   Gout 09/18/2012    Left big toe   Hypertension    Insomnia 09/13/2015   Migraine headache    remote history   OSA (obstructive sleep apnea)    Preventative health care 09/05/2014   Right shoulder pain 02/19/2009   Qualifier: Diagnosis of  By: Wynona Luna    RLS (restless legs syndrome) 09/07/2015   Sleep apnea    uses cpap   Tremor 09/07/2015    Past Surgical History:  Procedure Laterality Date   HERNIA REPAIR  [id number]   umbilical hernia repair   KNEE SURGERY     left knee surgery   TONSILLECTOMY  1959   VASECTOMY      Family History  Problem Relation Age of Onset   Breast cancer Mother    Hypertension Mother    Hyperlipidemia Father    Kidney disease Father    Heart disease Father    Hypertension Father    Colon cancer Neg Hx    Stomach cancer Neg Hx    Colon polyps Neg Hx    Esophageal cancer Neg Hx    Rectal cancer Neg Hx     Social History   Socioeconomic History   Marital status: Widowed    Spouse name: Not on file   Number of children: 1   Years of education: Not on file   Highest education level: Not on  file  Occupational History   Not on file  Tobacco Use   Smoking status: Former    Years: 5.00    Types: Cigarettes    Quit date: 2005    Years since quitting: 19.1   Smokeless tobacco: Never   Tobacco comments:    1 pack per year  Vaping Use   Vaping Use: Never used  Substance and Sexual Activity   Alcohol use: No   Drug use: No   Sexual activity: Yes  Other Topics Concern   Not on file  Social History Narrative   Occupation:  Chemical engineer   Married  -  1 daughter (age 47)    Never Smoked   Alcohol use-no     Drug use-no          Social Determinants of Health   Financial Resource Strain: Low Risk  (05/11/2021)   Overall Financial Resource Strain (CARDIA)    Difficulty of Paying Living Expenses: Not hard at all  Food Insecurity: No Food Insecurity (05/11/2021)   Hunger Vital Sign    Worried About Running Out of Food in the Last Year: Never true     Ran Out of Food in the Last Year: Never true  Transportation Needs: No Transportation Needs (05/11/2021)   PRAPARE - Hydrologist (Medical): No    Lack of Transportation (Non-Medical): No  Physical Activity: Inactive (05/11/2021)   Exercise Vital Sign    Days of Exercise per Week: 0 days    Minutes of Exercise per Session: 0 min  Stress: No Stress Concern Present (05/11/2021)   [place]    Feeling of Stress : Not at all  Social Connections: Socially Isolated (05/11/2021)   Social Connection and Isolation Panel [NHANES]    Frequency of Communication with Friends and Family: Once a week    Frequency of Social Gatherings with Friends and Family: Once a week    Attends Religious Services: Never    Marine scientist or Organizations: No    Attends Archivist Meetings: Never    Marital Status: Married  Human resources officer Violence: Not At Risk (05/11/2021)   Humiliation, Afraid, Rape, and Kick questionnaire    Fear of Current or Ex-Partner: No    Emotionally Abused: No    Physically Abused: No    Sexually Abused: No    Outpatient Medications Prior to Visit  Medication Sig Dispense Refill   amLODipine (NORVASC) 10 MG tablet TAKE 1 TABLET BY MOUTH EVERY DAY 90 tablet 1   Calcium-Magnesium-Zinc 1000-400-15 MG TABS Take by mouth daily.     cetirizine (ZYRTEC) 10 MG tablet Take 10 mg by mouth daily.     Chelated Potassium 99 MG TABS Take 99 mg by mouth 2 (two) times daily.     Cholecalciferol (D3 ADULT PO) Take by mouth.     Cinnamon 500 MG capsule Take 500 mg 3 (three) times daily by mouth. +50 mg Chromium in each supplement.     Coenzyme Q10 (CO Q-10) 100 MG CAPS Take 200 mg by mouth daily.     Cyanocobalamin (VITAMIN B 12 PO) Take 1 capsule daily by mouth.     famotidine (PEPCID) 40 MG tablet TAKE 1 TABLET BY MOUTH EVERY DAY 90 tablet 1   ferrous sulfate 325 (65 FE) MG tablet TAKE  1 TABLET BY MOUTH EVERY DAY WITH BREAKFAST 90 tablet 0   Glucosamine-Chondroitin-Vit D3 1500-1200-800 MG-MG-UNIT PACK  Take by mouth.     glucose blood test strip One touch Ultra blue -Check sugars daily-  DX E11.69 100 each 11   hydrochlorothiazide (HYDRODIURIL) 25 MG tablet Take 1 tablet (25 mg total) by mouth daily. 30 tablet 3   lisinopril (ZESTRIL) 40 MG tablet TAKE 1 TABLET BY MOUTH EVERY DAY 90 tablet 1   Melatonin 10 MG TABS Take by mouth.     metFORMIN (GLUCOPHAGE) 500 MG tablet Take 1 tablet (500 mg total) by mouth 3 (three) times daily. 360 tablet 1   Misc Natural Products (TART CHERRY ADVANCED PO) Take by mouth.     Omega-3 Fatty Acids (FISH OIL PO) Take 1,500 mg by mouth daily.     Quercetin 250 MG TABS Take by mouth 2 (two) times daily.      simvastatin (ZOCOR) 10 MG tablet TAKE 1 TABLET BY MOUTH EVERYDAY AT BEDTIME 90 tablet 1   TURMERIC PO Take by mouth.     vitamin C (ASCORBIC ACID) 500 MG tablet Take 500 mg by mouth 3 (three) times daily.     OMEPRAZOLE PO      triamcinolone cream (KENALOG) 0.1 %      No facility-administered medications prior to visit.    No Known Allergies  Review of Systems  Genitourinary:  Positive for frequency.       Objective:    Physical Exam Constitutional:      General: He is not in acute distress.    Appearance: Normal appearance.  HENT:     Head: Normocephalic and atraumatic.     Right Ear: External ear normal.     Left Ear: External ear normal.  Eyes:     Extraocular Movements: Extraocular movements intact.     Pupils: Pupils are equal, round, and reactive to light.  Cardiovascular:     Rate and Rhythm: Normal rate and regular rhythm.     Pulses: Normal pulses.     Heart sounds: Normal heart sounds. No murmur heard.    No gallop.     Comments: Manual bp recheck 140/72 Pulmonary:     Effort: Pulmonary effort is normal. No respiratory distress.     Breath sounds: Normal breath sounds. No wheezing or rales.  Skin:     General: Skin is warm.  Neurological:     Mental Status: He is alert and oriented to person, place, and time.     BP (!) 150/80 (BP Location: Left Arm, Patient Position: Sitting, Cuff Size: Normal)   Pulse 81   Temp 98 F (36.7 C) (Oral)   Resp 16   Ht '5\' 11"'$  (1.803 m)   Wt 200 lb 9.6 oz (91 kg)   SpO2 97%   BMI 27.98 kg/m  Wt Readings from Last 3 Encounters:  09/12/22 200 lb 9.6 oz (91 kg)  08/02/22 195 lb (88.5 kg)  07/19/22 198 lb 12.8 oz (90.2 kg)       Assessment & Plan:  MUSCLE CRAMPS Assessment & Plan: Hydrate and monitor   Orders: -     Magnesium; Future  Hyperlipidemia, mixed Assessment & Plan: Tolerating statin, encouraged heart healthy diet, avoid trans fats, minimize simple carbs and saturated fats. Increase exercise as tolerated  Orders: -     Lipid panel; Future  Chronic gout without tophus, unspecified cause, unspecified site Assessment & Plan: Hydrate and monitor   Orders: -     Uric acid; Future  Essential hypertension Assessment & Plan: Well controlled, no changes to meds. Encouraged  heart healthy diet such as the DASH diet and exercise as tolerated.    Orders: -     CBC with Differential/Platelet; Future -     Comprehensive metabolic panel; Future -     TSH; Future  Diabetes mellitus type 2 in obese [facility]) Assessment & Plan: hgba1c acceptable, minimize simple carbs. Increase exercise as tolerated. Continue current meds   Orders: -     Hemoglobin A1c; Future -     Microalbumin / creatinine urine ratio; Future    I, Penni Homans, MD, personally preformed the services described in this documentation.  All medical record entries made by the scribe were at my direction and in my presence.  I have reviewed the chart and discharge instructions (if applicable) and agree that the record reflects my personal performance and is accurate and complete. 09/12/2022  Penni Homans, MD  I,Josephina Melcher,acting as a scribe for Penni Homans, MD.,have  documented all relevant documentation on the behalf of Penni Homans, MD,as directed by  Penni Homans, MD while in the presence of Penni Homans, MD.

## 2022-09-13 ENCOUNTER — Other Ambulatory Visit: Payer: Self-pay

## 2022-09-13 NOTE — Telephone Encounter (Signed)
Labs has been changed

## 2022-09-16 NOTE — Assessment & Plan Note (Signed)
hgba1c acceptable, minimize simple carbs. Increase exercise as tolerated. Continue current meds 

## 2022-09-16 NOTE — Assessment & Plan Note (Signed)
Well controlled, no changes to meds. Encouraged heart healthy diet such as the DASH diet and exercise as tolerated.  °

## 2022-09-20 ENCOUNTER — Other Ambulatory Visit (INDEPENDENT_AMBULATORY_CARE_PROVIDER_SITE_OTHER): Payer: Medicare Other

## 2022-09-20 DIAGNOSIS — E669 Obesity, unspecified: Secondary | ICD-10-CM

## 2022-09-20 DIAGNOSIS — I10 Essential (primary) hypertension: Secondary | ICD-10-CM

## 2022-09-20 DIAGNOSIS — E782 Mixed hyperlipidemia: Secondary | ICD-10-CM

## 2022-09-20 DIAGNOSIS — R252 Cramp and spasm: Secondary | ICD-10-CM

## 2022-09-20 DIAGNOSIS — M1A9XX0 Chronic gout, unspecified, without tophus (tophi): Secondary | ICD-10-CM

## 2022-09-20 DIAGNOSIS — E1169 Type 2 diabetes mellitus with other specified complication: Secondary | ICD-10-CM

## 2022-09-20 LAB — COMPREHENSIVE METABOLIC PANEL
ALT: 19 U/L (ref 0–53)
AST: 16 U/L (ref 0–37)
Albumin: 3.7 g/dL (ref 3.5–5.2)
Alkaline Phosphatase: 49 U/L (ref 39–117)
BUN: 22 mg/dL (ref 6–23)
CO2: 29 mEq/L (ref 19–32)
Calcium: 9.7 mg/dL (ref 8.4–10.5)
Chloride: 98 mEq/L (ref 96–112)
Creatinine, Ser: 1.02 mg/dL (ref 0.40–1.50)
GFR: 74.32 mL/min (ref >60.00)
Glucose, Bld: 148 mg/dL — ABNORMAL HIGH (ref 70–99)
Potassium: 3.2 mEq/L — ABNORMAL LOW (ref 3.5–5.1)
Sodium: 140 mEq/L (ref 135–145)
Total Bilirubin: 0.5 mg/dL (ref 0.2–1.2)
Total Protein: 6.7 g/dL (ref 6.0–8.3)

## 2022-09-20 LAB — CBC WITH DIFFERENTIAL/PLATELET
Basophils Absolute: 0.1 10*3/uL (ref 0.0–0.1)
Basophils Relative: 1.2 % (ref 0.0–3.0)
Eosinophils Absolute: 0.5 10*3/uL (ref 0.0–0.7)
Eosinophils Relative: 7.7 % — ABNORMAL HIGH (ref 0.0–5.0)
HCT: 40.8 % (ref 39.0–52.0)
Hemoglobin: 14.7 g/dL (ref 13.0–17.0)
Lymphocytes Relative: 35.8 % (ref 12.0–46.0)
Lymphs Abs: 2.3 10*3/uL (ref 0.7–4.0)
MCHC: 36 g/dL (ref 30.0–36.0)
MCV: 84.8 fl (ref 78.0–100.0)
Monocytes Absolute: 1.2 10*3/uL — ABNORMAL HIGH (ref 0.1–1.0)
Monocytes Relative: 18.9 % — ABNORMAL HIGH (ref 3.0–12.0)
Neutro Abs: 2.3 10*3/uL (ref 1.4–7.7)
Neutrophils Relative %: 36.4 % — ABNORMAL LOW (ref 43.0–77.0)
Platelets: 370 10*3/uL (ref 150.0–400.0)
RBC: 4.81 Mil/uL (ref 4.22–5.81)
RDW: 13.6 % (ref 11.5–15.5)
WBC: 6.5 10*3/uL (ref 4.0–10.5)

## 2022-09-20 LAB — LIPID PANEL
Cholesterol: 121 mg/dL (ref 0–200)
HDL: 32.2 mg/dL — ABNORMAL LOW (ref >39.00)
LDL Cholesterol: 67 mg/dL (ref 0–99)
NonHDL: 88.73
Total CHOL/HDL Ratio: 4
Triglycerides: 108 mg/dL (ref 0.0–149.0)
VLDL: 21.6 mg/dL (ref 0.0–40.0)

## 2022-09-20 LAB — TSH: TSH: 2.64 u[IU]/mL (ref 0.35–5.50)

## 2022-09-20 LAB — MAGNESIUM: Magnesium: 1.7 mg/dL (ref 1.5–2.5)

## 2022-09-20 LAB — HEMOGLOBIN A1C: Hgb A1c MFr Bld: 6.3 % (ref 4.6–6.5)

## 2022-09-20 LAB — URIC ACID: Uric Acid, Serum: 6.5 mg/dL (ref 4.0–7.8)

## 2022-09-20 LAB — MICROALBUMIN / CREATININE URINE RATIO
Creatinine,U: 67.6 mg/dL
Microalb Creat Ratio: 16.6 mg/g (ref 0.0–30.0)
Microalb, Ur: 11.2 mg/dL — ABNORMAL HIGH (ref 0.0–1.9)

## 2022-09-21 ENCOUNTER — Other Ambulatory Visit: Payer: Self-pay

## 2022-09-21 DIAGNOSIS — E782 Mixed hyperlipidemia: Secondary | ICD-10-CM

## 2022-09-21 MED ORDER — POTASSIUM CHLORIDE CRYS ER 20 MEQ PO TBCR: 20.0000 meq | EXTENDED_RELEASE_TABLET | Freq: Two times a day (BID) | ORAL | 3 refills | Status: DC

## 2022-09-26 ENCOUNTER — Ambulatory Visit (INDEPENDENT_AMBULATORY_CARE_PROVIDER_SITE_OTHER): Payer: Medicare Other | Admitting: Podiatry

## 2022-09-26 DIAGNOSIS — B351 Tinea unguium: Secondary | ICD-10-CM | POA: Diagnosis not present

## 2022-09-26 DIAGNOSIS — M79675 Pain in left toe(s): Secondary | ICD-10-CM

## 2022-09-26 DIAGNOSIS — S90211A Contusion of right great toe with damage to nail, initial encounter: Secondary | ICD-10-CM

## 2022-09-26 DIAGNOSIS — E119 Type 2 diabetes mellitus without complications: Secondary | ICD-10-CM | POA: Diagnosis not present

## 2022-09-26 DIAGNOSIS — E1142 Type 2 diabetes mellitus with diabetic polyneuropathy: Secondary | ICD-10-CM

## 2022-09-26 DIAGNOSIS — L84 Corns and callosities: Secondary | ICD-10-CM | POA: Diagnosis not present

## 2022-09-26 DIAGNOSIS — M79674 Pain in right toe(s): Secondary | ICD-10-CM

## 2022-09-27 ENCOUNTER — Encounter: Payer: Self-pay | Admitting: Podiatry

## 2022-09-27 NOTE — Progress Notes (Signed)
ANNUAL DIABETIC FOOT EXAM  Subjective: Evan Best presents today for annual diabetic foot examination.  Chief Complaint  Patient presents with   Nail Problem    DFC BS-did not check today A1C-6.4 PCP-Blyth, Stacy PCP VST-09/21/2022    Patient confirms h/o diabetes.  Patient relates 10 year h/o diabetes.  Patient denies any h/o foot wounds.  Patient has been diagnosed with neuropathy.  Risk factors: diabetes, diabetic neuropathy, history of gout, HTN, hyperlipidemia, h/o tobacco use in remission.  [name], MD is patient's PCP.  Past Medical History:  Diagnosis Date   Allergic state 09/22/2013   Anemia 09/07/2015   Arthritis 03/06/2015   Diabetes mellitus type II 1/09   Dysphagia 05/04/2017   Esophageal reflux 03/06/2015   Gout 09/18/2012   Left big toe   Hypertension    Insomnia 09/13/2015   Migraine headache    remote history   OSA (obstructive sleep apnea)    Preventative health care 09/05/2014   Right shoulder pain 02/19/2009   Qualifier: Diagnosis of  By: Wynona Luna    RLS (restless legs syndrome) 09/07/2015   Sleep apnea    uses cpap   Tremor 09/07/2015   Patient Active Problem List   Diagnosis Date Noted   History of dysplastic nevus 06/13/2022   Memory loss 07/26/2021   Benign neoplasm of scalp and skin of neck 05/23/2021   Hemangioma of skin and subcutaneous tissue 05/23/2021   Lentigo 05/23/2021   Melanocytic nevi of trunk 05/23/2021   Neoplasm of uncertain behavior of skin 05/23/2021   Peripheral venous insufficiency 05/23/2021   Varicose veins of right lower extremity with inflammation 05/23/2021   Disequilibrium 03/24/2021   Peripheral neuropathy 03/23/2021   Hearing loss 03/23/2021   Pedal edema 09/02/2020   Pain of left calf 04/28/2020   Depression 04/28/2020   Sixth nerve palsy, 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   Cherry angioma 02/14/2018   Dysphagia 05/04/2017   Sun-damaged skin 01/11/2017   Olecranon bursitis of left elbow 11/19/2016    Insomnia 09/13/2015   Anemia 09/07/2015   Tremor 09/07/2015   RLS (restless legs syndrome) 09/07/2015   Arthritis 03/06/2015   Esophageal reflux 03/06/2015   Preventative health care 09/05/2014   Pain in joint, ankle and foot 04/06/2014   Allergies 09/22/2013   Gout 09/18/2012   Myalgia 12/22/2010   ACTINIC KERATOSIS 06/17/2010   MUSCLE CRAMPS 06/17/2010   Right shoulder pain 02/19/2009   COUGH 09/02/2008   CHRONIC RHINITIS 05/16/2008   PARESTHESIA 05/02/2008   Hyperlipidemia, mixed 08/15/2007   Obstructive sleep apnea 07/31/2007   Essential hypertension 07/31/2007   Diabetes mellitus type 2 in obese ([place]) 07/31/2007   Past Surgical History:  Procedure Laterality Date   HERNIA REPAIR  [id number]   umbilical hernia repair   KNEE SURGERY     left knee surgery   TONSILLECTOMY  1959   VASECTOMY     Current Outpatient Medications on File Prior to Visit  Medication Sig Dispense Refill   amLODipine (NORVASC) 10 MG tablet TAKE 1 TABLET BY MOUTH EVERY DAY 90 tablet 1   Calcium-Magnesium-Zinc 1000-400-15 MG TABS Take by mouth daily.     cetirizine (ZYRTEC) 10 MG tablet Take 10 mg by mouth daily.     Chelated Potassium 99 MG TABS Take 99 mg by mouth 2 (two) times daily.     Cholecalciferol (D3 ADULT PO) Take by mouth.     Cinnamon 500 MG capsule Take 500 mg 3 (three) times daily by mouth. +50  mg Chromium in each supplement.     Coenzyme Q10 (CO Q-10) 100 MG CAPS Take 200 mg by mouth daily.     Cyanocobalamin (VITAMIN B 12 PO) Take 1 capsule daily by mouth.     famotidine (PEPCID) 40 MG tablet TAKE 1 TABLET BY MOUTH EVERY DAY 90 tablet 1   ferrous sulfate 325 (65 FE) MG tablet TAKE 1 TABLET BY MOUTH EVERY DAY WITH BREAKFAST 90 tablet 0   Glucosamine-Chondroitin-Vit D3 1500-1200-800 MG-MG-UNIT PACK Take by mouth.     glucose blood test strip One touch Ultra blue -Check sugars daily-  DX E11.69 100 each 11   hydrochlorothiazide (HYDRODIURIL) 25 MG tablet Take 1 tablet (25 mg total) by mouth  daily. 30 tablet 3   lisinopril (ZESTRIL) 40 MG tablet TAKE 1 TABLET BY MOUTH EVERY DAY 90 tablet 1   Melatonin 10 MG TABS Take by mouth.     metFORMIN (GLUCOPHAGE) 500 MG tablet Take 1 tablet (500 mg total) by mouth 3 (three) times daily. 360 tablet 1   Misc Natural Products (TART CHERRY ADVANCED PO) Take by mouth.     Omega-3 Fatty Acids (FISH OIL PO) Take 1,500 mg by mouth daily.     potassium chloride SA (KLOR-CON M) 20 MEQ tablet Take 1 tablet (20 mEq total) by mouth 2 (two) times daily. 2 tabs po daily x 5 days then decrease to 1 tab daily 30 tablet 3   Quercetin 250 MG TABS Take by mouth 2 (two) times daily.      simvastatin (ZOCOR) 10 MG tablet TAKE 1 TABLET BY MOUTH EVERYDAY AT BEDTIME 90 tablet 1   TURMERIC PO Take by mouth.     vitamin C (ASCORBIC ACID) 500 MG tablet Take 500 mg by mouth 3 (three) times daily.     No current facility-administered medications on file prior to visit.    No Known Allergies Social History   Occupational History   Not on file  Tobacco Use   Smoking status: Former    Years: 5    Types: Cigarettes    Quit date: 2005    Years since quitting: 19.2   Smokeless tobacco: Never   Tobacco comments:    1 pack per year  Vaping Use   Vaping Use: Never used  Substance and Sexual Activity   Alcohol use: No   Drug use: No   Sexual activity: Yes   Family History  Problem Relation Age of Onset   Breast cancer Mother    Hypertension Mother    Hyperlipidemia Father    Kidney disease Father    Heart disease Father    Hypertension Father    Colon cancer Neg Hx    Stomach cancer Neg Hx    Colon polyps Neg Hx    Esophageal cancer Neg Hx    Rectal cancer Neg Hx    Immunization History  Administered Date(s) Administered   Fluad Quad(high Dose 65+) 04/24/2019, 06/01/2020   Influenza Split 05/13/2011, 05/11/2012   Influenza Whole 05/13/2009, 05/14/2010, 04/10/2013   Influenza, High Dose Seasonal PF 05/04/2017   Influenza-Unspecified 04/11/2014,  04/17/2015, 04/10/2018   PFIZER(Purple Top)SARS-COV-2 Vaccination 10/18/2019, 11/12/2019   Pneumococcal Conjugate-13 05/04/2017   Pneumococcal Polysaccharide-23 05/24/2018   Tdap 06/21/2011   Zoster Recombinat (Shingrix) 02/15/2018, 07/09/2018     Review of Systems: Negative except as noted in the HPI.   Objective: There were no vitals filed for this visit.  Evan Best is a pleasant 72 y.o. male in NAD.  AAO X 3.  Vascular Examination: Capillary refill time immediate b/l. Vascular status intact b/l with palpable pedal pulses. Pedal hair present b/l. No edema. No pain with calf compression b/l. Skin temperature gradient WNL b/l.   Neurological Examination: Sensation grossly intact b/l with 10 gram monofilament. Vibratory sensation intact b/l. Pt has subjective symptoms of neuropathy.  Dermatological Examination: Evidence of subacute proximal nailfold hematoma which is resolving. No erythema, no edema, no drainage, no fluctuance. No open wounds b/l LE. No interdigital macerations noted b/l LE. Toenails 1-5 b/l elongated, discolored, dystrophic, thickened, crumbly with subungual debris and tenderness to dorsal palpation. Hyperkeratotic lesion(s) right great toe.  No erythema, no edema, no drainage, no fluctuance.  Musculoskeletal Examination: Normal muscle strength 5/5 to all lower extremity muscle groups bilaterally. No pain, crepitus or joint limitation noted with ROM b/l LE. No gross bony pedal deformities b/l. Patient ambulates independently without assistive aids.  Radiographs: None  Last A1c:      Latest Ref Rng & Units 09/20/2022    9:30 AM 06/15/2022   11:39 AM 11/19/2021    3:33 PM  Hemoglobin A1C  Hemoglobin-A1c 4.6 - 6.5 % 6.3  5.5  5.6    Footwear Assessment: Does the patient wear appropriate shoes? Yes. Does the patient need inserts/orthotics? No.  Lab Results  Component Value Date   HGBA1C 6.3 09/20/2022   ADA Risk Categorization: Low Risk :  Patient has  all of the following: Intact protective sensation No prior foot ulcer  No severe deformity Pedal pulses present  Assessment: 1. Pain due to onychomycosis of toenails of both feet   2. Callus   3. Subungual hematoma of great toe of right foot, initial encounter   4. Controlled type 2 diabetes mellitus without complication, without long-term current use of insulin ([place])   5. Encounter for diabetic foot exam [facility])     Plan: -Patient was evaluated and treated. All patient's and/or POA's questions/concerns answered on today's visit. -Diabetic foot examination performed today. -Patient to continue soft, supportive shoe gear daily. -Mycotic toenails 1-5 bilaterally were debrided in length and girth with sterile nail nippers and dremel without incident. -Callus(es) right great toe pared utilizing sterile scalpel blade without complication or incident. Total number debrided =1. -Right great toe stable; monitor. -Patient/POA to call should there be question/concern in the interim. Return in about 3 months (around 12/27/2022).  Marzetta Board, DPM

## 2022-09-28 ENCOUNTER — Other Ambulatory Visit (INDEPENDENT_AMBULATORY_CARE_PROVIDER_SITE_OTHER): Payer: Medicare Other

## 2022-09-28 DIAGNOSIS — E782 Mixed hyperlipidemia: Secondary | ICD-10-CM | POA: Diagnosis not present

## 2022-09-28 LAB — COMPREHENSIVE METABOLIC PANEL
ALT: 16 U/L (ref 0–53)
AST: 15 U/L (ref 0–37)
Albumin: 3.8 g/dL (ref 3.5–5.2)
Alkaline Phosphatase: 48 U/L (ref 39–117)
BUN: 23 mg/dL (ref 6–23)
CO2: 28 mEq/L (ref 19–32)
Calcium: 8.9 mg/dL (ref 8.4–10.5)
Chloride: 101 mEq/L (ref 96–112)
Creatinine, Ser: 1.06 mg/dL (ref 0.40–1.50)
GFR: 70.96 mL/min (ref >60.00)
Glucose, Bld: 171 mg/dL — ABNORMAL HIGH (ref 70–99)
Potassium: 3.9 mEq/L (ref 3.5–5.1)
Sodium: 137 mEq/L (ref 135–145)
Total Bilirubin: 0.4 mg/dL (ref 0.2–1.2)
Total Protein: 6.7 g/dL (ref 6.0–8.3)

## 2022-10-01 ENCOUNTER — Other Ambulatory Visit: Payer: Self-pay | Admitting: Family Medicine

## 2022-10-01 DIAGNOSIS — E611 Iron deficiency: Secondary | ICD-10-CM

## 2022-10-07 ENCOUNTER — Other Ambulatory Visit: Payer: Self-pay | Admitting: Family Medicine

## 2022-10-10 NOTE — Progress Notes (Signed)
HPI male never smoker for sleep evaluation.  Previously followed for OSA, restless legs, chronic rhinitis, complicated by HBP, DM 2, GERD, HBP, gout, NPSG 11/26/06-AHI 56/hour, desaturation to 86%, body weight 225 pounds  ---------------------------------------------------------------------------------------- 10/07/21- 72 year old male former light smoker followed for OSA, restless legs, chronic rhinitis, complicated by HBP, DM 2, GERD, gout, chronic Headaches, Hyperlipidemia,  CPAP auto 5-15/[employer] Download- 97%, AHI 0.3/ hr Body weight today-210 lbs Covid vax-2 Phizer Flu vax-no Note reviewed.  He is very comfortable with his CPAP and says it does well.  Headgear is worn out and needs replacement. He denies new health problems or concerns.  10/13/22- 72 year old male former light smoker followed for OSA, restless legs, chronic rhinitis, complicated by HBP, DM 2, GERD, gout, chronic Headaches, Hyperlipidemia,  CPAP auto 5-15/[employer]   AirSense 10 AutoSet Download compliance 100%, AHI 0.4/ hr Body weight today-200 lbs Covid vax-2 Phizer Flu vax- Download reviewed.  He is satisfied to continue CPAP without change, recognizing benefit.  Leg aches at night interfering some with sleep quality.  He takes occasional afternoon nap. Seasonal pollen rhinitis has not been significant so far this year.  ROS-see HPI   + = positive Constitutional:   No-   weight loss, night sweats, fevers, chills, fatigue, lassitude. HEENT:   No-  headaches, difficulty swallowing, tooth/dental problems, sore throat,       No-  sneezing, itching, ear ache, nasal congestion, post nasal drip,  CV:  No-   chest pain, orthopnea, PND, swelling in lower extremities, anasarca, dizziness, palpitations Resp: No-   shortness of breath with exertion or at rest.              No-   productive cough,   non-productive cough,  No- coughing up of blood.              No-   change in color of mucus.  No- wheezing.   Skin: No-   rash or  lesions. GI:  No-   heartburn, indigestion, abdominal pain, nausea, vomiting,  GU: . MS:  No-   joint pain or swelling.   Neuro-     nothing unusual Psych:  No- change in mood or affect. No depression or anxiety.  No memory loss.  OBJ General- Alert, Oriented, Affect-appropriate, Distress- none acute,  Skin- rash-none, lesions- none, excoriation- none Lymphadenopathy- none Head- atraumatic            Eyes- Gross vision intact, PERRLA, conjunctivae clear secretions            Ears- Hearing, canals-normal            Nose- Clear, +narrower on right, +mucus bridging, polyps, erosion, perforation             Throat- Mallampati III , mucosa  , drainage- none, tonsils- atrophic Neck- flexible , trachea midline, no stridor , thyroid nl, carotid no bruit Chest - symmetrical excursion , unlabored           Heart/CV- RRR, no murmur , no gallop  , no rub, nl s1 s2                           - JVD- none , edema- none, stasis changes- none, varices- none           Lung- clear to P&A, wheeze- none, cough- none , dullness-none, rub- none           Chest wall-  Abd-  Br/  Gen/ Rectal- Not done, not indicated Extrem- cyanosis- none, clubbing, none, atrophy- none, strength- nl Neuro- grossly intact to observation

## 2022-10-13 ENCOUNTER — Encounter: Payer: Self-pay | Admitting: Internal Medicine

## 2022-10-13 ENCOUNTER — Ambulatory Visit (INDEPENDENT_AMBULATORY_CARE_PROVIDER_SITE_OTHER): Payer: Medicare Other | Admitting: Internal Medicine

## 2022-10-13 VITALS — BP 132/72 | HR 68 | Ht 71.0 in | Wt 200.4 lb

## 2022-10-13 DIAGNOSIS — I8311 Varicose veins of right lower extremity with inflammation: Secondary | ICD-10-CM | POA: Diagnosis not present

## 2022-10-13 DIAGNOSIS — G4733 Obstructive sleep apnea (adult) (pediatric): Secondary | ICD-10-CM | POA: Diagnosis not present

## 2022-10-13 NOTE — Patient Instructions (Addendum)
We can continue CPAP auto 5-15  [employer] can refit your mask for you if you need  Tylenol PM might help some with leg pains as another option.  Please call if we can help

## 2022-10-28 ENCOUNTER — Other Ambulatory Visit: Payer: Self-pay | Admitting: Family Medicine

## 2022-11-14 ENCOUNTER — Other Ambulatory Visit: Payer: Self-pay | Admitting: Family Medicine

## 2022-11-16 ENCOUNTER — Other Ambulatory Visit: Payer: Self-pay | Admitting: Gastroenterology

## 2022-11-16 ENCOUNTER — Other Ambulatory Visit: Payer: Self-pay | Admitting: Family Medicine

## 2022-11-16 DIAGNOSIS — Z86010 Personal history of colonic polyps: Secondary | ICD-10-CM

## 2022-11-16 NOTE — Assessment & Plan Note (Signed)
Leg discomfort is bothersome mainly at night.  He is advised to discuss this with his primary physician.

## 2022-11-16 NOTE — Assessment & Plan Note (Signed)
Benefits from CPAP with good compliance and control.  We did discuss how to communicate with his DME company if mask fit and comfort or problem. Plan-continue auto 5-15

## 2022-12-13 DIAGNOSIS — H353131 Nonexudative age-related macular degeneration, bilateral, early dry stage: Secondary | ICD-10-CM | POA: Diagnosis not present

## 2022-12-13 DIAGNOSIS — E119 Type 2 diabetes mellitus without complications: Secondary | ICD-10-CM | POA: Diagnosis not present

## 2022-12-13 DIAGNOSIS — H25813 Combined forms of age-related cataract, bilateral: Secondary | ICD-10-CM | POA: Diagnosis not present

## 2022-12-13 DIAGNOSIS — H04123 Dry eye syndrome of bilateral lacrimal glands: Secondary | ICD-10-CM | POA: Diagnosis not present

## 2022-12-13 DIAGNOSIS — H43393 Other vitreous opacities, bilateral: Secondary | ICD-10-CM | POA: Diagnosis not present

## 2022-12-13 LAB — HM DIABETES EYE EXAM

## 2023-01-09 ENCOUNTER — Telehealth: Payer: Self-pay | Admitting: *Deleted

## 2023-01-09 NOTE — Telephone Encounter (Signed)
Received fax from this company.  Spoke with patient and he did not want this done.  Scam paperwork.  Paperwork shredded.

## 2023-01-22 NOTE — Assessment & Plan Note (Signed)
Well controlled, no changes to meds. Encouraged heart healthy diet such as the DASH diet and exercise as tolerated.  °

## 2023-01-22 NOTE — Assessment & Plan Note (Signed)
Hydrate and monitor 

## 2023-01-22 NOTE — Assessment & Plan Note (Signed)
hgba1c acceptable, minimize simple carbs. Increase exercise as tolerated. Continue current meds 

## 2023-01-22 NOTE — Assessment & Plan Note (Signed)
Tolerating statin, encouraged heart healthy diet, avoid trans fats, minimize simple carbs and saturated fats. Increase exercise as tolerated 

## 2023-01-22 NOTE — Progress Notes (Signed)
Subjective:    Patient ID: Evan Best, male    DOB: 1951-03-15, 72 y.o.   MRN: [id number]  Chief Complaint  Patient presents with  . Follow-up    Follow up    HPI Discussed the use of AI scribe software for clinical note transcription with the patient, who gave verbal consent to proceed.  History of Present Illness   The patient, with a history of hypertension and diabetes, presents with multiple concerns. The patient reports high blood pressure readings during blood donation sessions, leading to a consultation with the doctor. The patient also experiences diabetic neuropathy in the feet, which has been a long-standing issue.  The patient has been diagnosed with sleep apnea and uses a machine for the same, but reports dissatisfaction with the duration of sleep, typically around 5-6 hours. The patient also reports waking up in phases, with eyes taking about an hour to focus properly.  The patient experiences discomfort in the left hip and knee, particularly in the morning. The left knee has been previously rebuilt. The patient reports that the discomfort eases as the day progresses. The patient also mentions difficulty sleeping on the left side due to discomfort in the rebuilt knee.  The patient reports issues with bladder and bowel movements. The bladder is described as overactive at times, causing the patient to wake up in the morning and occasionally at night. The patient also reports irregular bowel movements, with periods of no or minimal bowel movements followed by large bowel movements.  The patient also mentions issues with toenails, with thickening and discoloration noted. The patient sees a podiatrist for the same.    Patient is a 72 yo male in today for follow up on chronic medical concerns. No recent febrile illness or hospitalizations. Denies CP/palp/SOB/HA/congestion/fevers/GI or GU c/o. Taking meds as prescribed     Past Medical History:  Diagnosis Date  . Allergic  state 09/22/2013  . Anemia 09/07/2015  . Arthritis 03/06/2015  . Diabetes mellitus type II 1/09  . Dysphagia 05/04/2017  . Esophageal reflux 03/06/2015  . Gout 09/18/2012   Left big toe  . Hypertension   . Insomnia 09/13/2015  . Migraine headache    remote history  . OSA (obstructive sleep apnea)   . Preventative health care 09/05/2014  . Right shoulder pain 02/19/2009   Qualifier: Diagnosis of  By: Nena Jordan   . RLS (restless legs syndrome) 09/07/2015  . Sleep apnea    uses cpap  . Tremor 09/07/2015    Past Surgical History:  Procedure Laterality Date  . HERNIA REPAIR  1998   umbilical hernia repair  . KNEE SURGERY     left knee surgery  . TONSILLECTOMY  1959  . VASECTOMY      Family History  Problem Relation Age of Onset  . Breast cancer Mother   . Hypertension Mother   . Hyperlipidemia Father   . Kidney disease Father   . Heart disease Father   . Hypertension Father   . Colon cancer Neg Hx   . Stomach cancer Neg Hx   . Colon polyps Neg Hx   . Esophageal cancer Neg Hx   . Rectal cancer Neg Hx     Social History   Socioeconomic History  . Marital status: Widowed    Spouse name: Not on file  . Number of children: 1  . Years of education: Not on file  . Highest education level: Bachelor's degree (e.g., BA, AB, BS)  Occupational History  . Not on file  Tobacco Use  . Smoking status: Former    Current packs/day: 0.00    Types: Cigarettes    Start date: 2000    Quit date: 2005    Years since quitting: 19.5  . Smokeless tobacco: Never  . Tobacco comments:    1 pack per year  Vaping Use  . Vaping status: Never Used  Substance and Sexual Activity  . Alcohol use: No  . Drug use: No  . Sexual activity: Yes  Other Topics Concern  . Not on file  Social History Narrative   Occupation:  Energy manager   Married  -  1 daughter (age 28)    Never Smoked   Alcohol use-no     Drug use-no          Social Determinants of Health   Financial Resource  Strain: Low Risk  (01/16/2023)   Overall Financial Resource Strain (CARDIA)   . Difficulty of Paying Living Expenses: Not hard at all  Food Insecurity: No Food Insecurity (01/16/2023)   Hunger Vital Sign   . Worried About Programme researcher, broadcasting/film/video in the Last Year: Never true   . Ran Out of Food in the Last Year: Never true  Transportation Needs: No Transportation Needs (01/16/2023)   PRAPARE - Transportation   . Lack of Transportation (Medical): No   . Lack of Transportation (Non-Medical): No  Physical Activity: Unknown (01/16/2023)   Exercise Vital Sign   . Days of Exercise per Week: 0 days   . Minutes of Exercise per Session: Not on file  Stress: No Stress Concern Present (01/16/2023)   [employer] of Occupational Health - Occupational Stress Questionnaire   . Feeling of Stress : Not at all  Social Connections: Socially Isolated (01/16/2023)   Social Connection and Isolation Panel [NHANES]   . Frequency of Communication with Friends and Family: Never   . Frequency of Social Gatherings with Friends and Family: More than three times a week   . Attends Religious Services: Never   . Active Member of Clubs or Organizations: No   . Attends Banker Meetings: Not on file   . Marital Status: Widowed  Intimate Partner Violence: Not At Risk (05/11/2021)   Humiliation, Afraid, Rape, and Kick questionnaire   . Fear of Current or Ex-Partner: No   . Emotionally Abused: No   . Physically Abused: No   . Sexually Abused: No    Outpatient Medications Prior to Visit  Medication Sig Dispense Refill  . amLODipine (NORVASC) 10 MG tablet Take 1 tablet (10 mg total) by mouth daily. 90 tablet 1  . Calcium-Magnesium-Zinc 1000-400-15 MG TABS Take by mouth daily.    . cetirizine (ZYRTEC) 10 MG tablet Take 10 mg by mouth daily.    . Chelated Potassium 99 MG TABS Take 99 mg by mouth 2 (two) times daily.    . Cholecalciferol (D3 ADULT PO) Take by mouth.    . Cinnamon 500 MG capsule Take 500 mg 3 (three)  times daily by mouth. +50 mg Chromium in each supplement.    . Coenzyme Q10 (CO Q-10) 100 MG CAPS Take 200 mg by mouth daily.    . Cyanocobalamin (VITAMIN B 12 PO) Take 1 capsule daily by mouth.    . famotidine (PEPCID) 40 MG tablet TAKE 1 TABLET BY MOUTH EVERY DAY 90 tablet 1  . ferrous sulfate 325 (65 FE) MG tablet TAKE 1 TABLET BY MOUTH EVERY DAY WITH  BREAKFAST 90 tablet 0  . Glucosamine-Chondroitin-Vit D3 1500-1200-800 MG-MG-UNIT PACK Take by mouth.    Marland Kitchen glucose blood test strip One touch Ultra blue -Check sugars daily-  DX E11.69 100 each 11  . hydrochlorothiazide (HYDRODIURIL) 25 MG tablet Take 1 tablet (25 mg total) by mouth daily. 90 tablet 1  . KLOR-CON M20 20 MEQ tablet TAKE 2 TABLET BY MOUTH DAILY FOR 5 DAYS THEN DECREASE TO 1 TABLET DAILY 90 tablet 1  . lisinopril (ZESTRIL) 40 MG tablet TAKE 1 TABLET BY MOUTH EVERY DAY 90 tablet 1  . Melatonin 10 MG TABS Take by mouth.    . metFORMIN (GLUCOPHAGE) 500 MG tablet Take 2 tablets (1,000 mg total) by mouth 2 (two) times daily with a meal. 360 tablet 1  . Misc Natural Products (TART CHERRY ADVANCED PO) Take by mouth.    . Omega-3 Fatty Acids (FISH OIL PO) Take 1,500 mg by mouth daily.    . Quercetin 250 MG TABS Take by mouth 2 (two) times daily.     . simvastatin (ZOCOR) 10 MG tablet Take 1 tablet (10 mg total) by mouth at bedtime. 90 tablet 1  . TURMERIC PO Take by mouth.    . vitamin C (ASCORBIC ACID) 500 MG tablet Take 500 mg by mouth 3 (three) times daily.     No facility-administered medications prior to visit.    No Known Allergies  Review of Systems  Constitutional:  Positive for malaise/fatigue. Negative for fever.  HENT:  Negative for congestion.   Eyes:  Negative for blurred vision.  Respiratory:  Negative for shortness of breath.   Cardiovascular:  Negative for chest pain, palpitations and leg swelling.  Gastrointestinal:  Positive for constipation. Negative for abdominal pain, blood in stool and nausea.  Genitourinary:   Positive for frequency. Negative for dysuria.  Musculoskeletal:  Positive for joint pain. Negative for falls.  Skin:  Negative for rash.  Neurological:  Negative for dizziness, loss of consciousness and headaches.  Endo/Heme/Allergies:  Negative for environmental allergies.  Psychiatric/Behavioral:  Negative for depression. The patient is not nervous/anxious.        Objective:    Physical Exam Vitals reviewed.  Constitutional:      Appearance: Normal appearance. He is not ill-appearing.  HENT:     Head: Normocephalic and atraumatic.     Nose: Nose normal.  Eyes:     Conjunctiva/sclera: Conjunctivae normal.  Cardiovascular:     Rate and Rhythm: Normal rate.     Pulses: Normal pulses.     Heart sounds: Normal heart sounds. No murmur heard. Pulmonary:     Effort: Pulmonary effort is normal.     Breath sounds: Normal breath sounds. No wheezing.  Abdominal:     Palpations: Abdomen is soft. There is no mass.     Tenderness: There is no abdominal tenderness.  Musculoskeletal:     Cervical back: Normal range of motion.     Right lower leg: No edema.     Left lower leg: No edema.  Skin:    General: Skin is warm and dry.  Neurological:     General: No focal deficit present.     Mental Status: He is alert and oriented to person, place, and time.  Psychiatric:        Mood and Affect: Mood normal.   BP 134/80 (BP Location: Left Arm, Patient Position: Sitting, Cuff Size: Normal)   Pulse 92   Temp 98 F (36.7 C) (Oral)   Resp 16  Ht 5\' 8"  (1.727 m)   Wt 205 lb 6.4 oz (93.2 kg)   SpO2 95%   BMI 31.23 kg/m  Wt Readings from Last 3 Encounters:  01/23/23 205 lb 6.4 oz (93.2 kg)  10/13/22 200 lb 6.4 oz (90.9 kg)  09/12/22 200 lb 9.6 oz (91 kg)    Diabetic Foot Exam - Simple   No data filed    Lab Results  Component Value Date   WBC 6.5 09/20/2022   HGB 14.7 09/20/2022   HCT 40.8 09/20/2022   PLT 370.0 09/20/2022   GLUCOSE 171 (H) 09/28/2022   CHOL 121 09/20/2022    TRIG 108.0 09/20/2022   HDL 32.20 (L) 09/20/2022   LDLDIRECT 74.0 08/03/2017   LDLCALC 67 09/20/2022   ALT 16 09/28/2022   AST 15 09/28/2022   NA 137 09/28/2022   K 3.9 09/28/2022   CL 101 09/28/2022   CREATININE 1.06 09/28/2022   BUN 23 09/28/2022   CO2 28 09/28/2022   TSH 2.64 09/20/2022   PSA 0.61 02/08/2018   HGBA1C 6.3 09/20/2022   MICROALBUR 11.2 (H) 09/20/2022    Lab Results  Component Value Date   TSH 2.64 09/20/2022   Lab Results  Component Value Date   WBC 6.5 09/20/2022   HGB 14.7 09/20/2022   HCT 40.8 09/20/2022   MCV 84.8 09/20/2022   PLT 370.0 09/20/2022   Lab Results  Component Value Date   NA 137 09/28/2022   K 3.9 09/28/2022   CO2 28 09/28/2022   GLUCOSE 171 (H) 09/28/2022   BUN 23 09/28/2022   CREATININE 1.06 09/28/2022   BILITOT 0.4 09/28/2022   ALKPHOS 48 09/28/2022   AST 15 09/28/2022   ALT 16 09/28/2022   PROT 6.7 09/28/2022   ALBUMIN 3.8 09/28/2022   CALCIUM 8.9 09/28/2022   GFR 70.96 09/28/2022   Lab Results  Component Value Date   CHOL 121 09/20/2022   Lab Results  Component Value Date   HDL 32.20 (L) 09/20/2022   Lab Results  Component Value Date   LDLCALC 67 09/20/2022   Lab Results  Component Value Date   TRIG 108.0 09/20/2022   Lab Results  Component Value Date   CHOLHDL 4 09/20/2022   Lab Results  Component Value Date   HGBA1C 6.3 09/20/2022       Assessment & Plan:  Essential hypertension Assessment & Plan: Well controlled, no changes to meds. Encouraged heart healthy diet such as the DASH diet and exercise as tolerated.    Orders: -     Comprehensive metabolic panel; Future -     CBC with Differential/Platelet; Future -     TSH; Future  Hyperlipidemia, mixed Assessment & Plan: Tolerating statin, encouraged heart healthy diet, avoid trans fats, minimize simple carbs and saturated fats. Increase exercise as tolerated  Orders: -     Lipid panel; Future  Diabetes mellitus type 2 in obese Assessment  & Plan: hgba1c acceptable, minimize simple carbs. Increase exercise as tolerated. Continue current meds   Orders: -     Hemoglobin A1c; Future  MUSCLE CRAMPS Assessment & Plan: Hydrate and monitor   Orders: -     Magnesium; Future  Chronic gout without tophus, unspecified cause, unspecified site Assessment & Plan: Hydrate and monitor   Orders: -     Uric acid; Future  Obstructive sleep apnea Assessment & Plan: Using CPAP nightly and sleeps 5-6 hours.    Chronic pain of left knee Assessment & Plan: Left knee pain,  has had a TKR but he notes his knee hurts at bedtime but better once he gets moving.    Nocturia -     PSA; Future -     Urinalysis, Routine w reflex microscopic; Future    Assessment and Plan    Diabetic Neuropathy: Reports of discomfort in the left leg, particularly at night. No new changes in symptoms. -Continue current management.  Sleep Apnea: Reports of using machine with mostly good results, but not sleeping as long as desired (5-6 hours). -Continue use of sleep apnea machine.  Overactive Bladder: Reports of increased frequency and urgency, particularly in the morning. No pain or burning. -Order urinalysis and PSA to rule out any underlying issues. -Discuss potential referral to urologist if symptoms worsen.  Onychomycosis: Reports of thickening of toenails, particularly on the big toes. Currently managed by podiatrist. -Continue current management with podiatrist. -Consider home remedies such as vinegar soaks if patient is willing and able.  Colonoscopy: Due for follow-up colonoscopy. No new bowel symptoms reported. -Contact gastroenterology for scheduling of colonoscopy. -If no response in 4-8 weeks, patient to contact office.  Follow-up in four months.         Danise Edge, MD

## 2023-01-23 ENCOUNTER — Ambulatory Visit: Payer: Medicare Other | Admitting: Family Medicine

## 2023-01-23 VITALS — BP 134/80 | HR 92 | Temp 98.0°F | Resp 16 | Ht 68.0 in | Wt 205.4 lb

## 2023-01-23 DIAGNOSIS — R351 Nocturia: Secondary | ICD-10-CM | POA: Diagnosis not present

## 2023-01-23 DIAGNOSIS — M1A9XX0 Chronic gout, unspecified, without tophus (tophi): Secondary | ICD-10-CM | POA: Diagnosis not present

## 2023-01-23 DIAGNOSIS — G8929 Other chronic pain: Secondary | ICD-10-CM | POA: Diagnosis not present

## 2023-01-23 DIAGNOSIS — I10 Essential (primary) hypertension: Secondary | ICD-10-CM

## 2023-01-23 DIAGNOSIS — R252 Cramp and spasm: Secondary | ICD-10-CM | POA: Diagnosis not present

## 2023-01-23 DIAGNOSIS — E1169 Type 2 diabetes mellitus with other specified complication: Secondary | ICD-10-CM

## 2023-01-23 DIAGNOSIS — M25562 Pain in left knee: Secondary | ICD-10-CM

## 2023-01-23 DIAGNOSIS — E782 Mixed hyperlipidemia: Secondary | ICD-10-CM | POA: Diagnosis not present

## 2023-01-23 DIAGNOSIS — E669 Obesity, unspecified: Secondary | ICD-10-CM

## 2023-01-23 DIAGNOSIS — G4733 Obstructive sleep apnea (adult) (pediatric): Secondary | ICD-10-CM

## 2023-01-23 NOTE — Patient Instructions (Signed)
Hypertension, Adult High blood pressure (hypertension) is when the force of blood pumping through the arteries is too strong. The arteries are the blood vessels that carry blood from the heart throughout the body. Hypertension forces the heart to work harder to pump blood and may cause arteries to become narrow or stiff. Untreated or uncontrolled hypertension can lead to a heart attack, heart failure, a stroke, kidney disease, and other problems. A blood pressure reading consists of a higher number over a lower number. Ideally, your blood pressure should be below 120/80. The first ("top") number is called the systolic pressure. It is a measure of the pressure in your arteries as your heart beats. The second ("bottom") number is called the diastolic pressure. It is a measure of the pressure in your arteries as the heart relaxes. What are the causes? The exact cause of this condition is not known. There are some conditions that result in high blood pressure. What increases the risk? Certain factors may make you more likely to develop high blood pressure. Some of these risk factors are under your control, including: Smoking. Not getting enough exercise or physical activity. Being overweight. Having too much fat, sugar, calories, or salt (sodium) in your diet. Drinking too much alcohol. Other risk factors include: Having a personal history of heart disease, diabetes, high cholesterol, or kidney disease. Stress. Having a family history of high blood pressure and high cholesterol. Having obstructive sleep apnea. Age. The risk increases with age. What are the signs or symptoms? High blood pressure may not cause symptoms. Very high blood pressure (hypertensive crisis) may cause: Headache. Fast or irregular heartbeats (palpitations). Shortness of breath. Nosebleed. Nausea and vomiting. Vision changes. Severe chest pain, dizziness, and seizures. How is this diagnosed? This condition is diagnosed by  measuring your blood pressure while you are seated, with your arm resting on a flat surface, your legs uncrossed, and your feet flat on the floor. The cuff of the blood pressure monitor will be placed directly against the skin of your upper arm at the level of your heart. Blood pressure should be measured at least twice using the same arm. Certain conditions can cause a difference in blood pressure between your right and left arms. If you have a high blood pressure reading during one visit or you have normal blood pressure with other risk factors, you may be asked to: Return on a different day to have your blood pressure checked again. Monitor your blood pressure at home for 1 week or longer. If you are diagnosed with hypertension, you may have other blood or imaging tests to help your health care provider understand your overall risk for other conditions. How is this treated? This condition is treated by making healthy lifestyle changes, such as eating healthy foods, exercising more, and reducing your alcohol intake. You may be referred for counseling on a healthy diet and physical activity. Your health care provider may prescribe medicine if lifestyle changes are not enough to get your blood pressure under control and if: Your systolic blood pressure is above 130. Your diastolic blood pressure is above 80. Your personal target blood pressure may vary depending on your medical conditions, your age, and other factors. Follow these instructions at home: Eating and drinking  Eat a diet that is high in fiber and potassium, and low in sodium, added sugar, and fat. An example of this eating plan is called the DASH diet. DASH stands for Dietary Approaches to Stop Hypertension. To eat this way: Eat   plenty of fresh fruits and vegetables. Try to fill one half of your plate at each meal with fruits and vegetables. Eat whole grains, such as whole-wheat pasta, brown rice, or whole-grain bread. Fill about one  fourth of your plate with whole grains. Eat or drink low-fat dairy products, such as skim milk or low-fat yogurt. Avoid fatty cuts of meat, processed or cured meats, and poultry with skin. Fill about one fourth of your plate with lean proteins, such as fish, chicken without skin, beans, eggs, or tofu. Avoid pre-made and processed foods. These tend to be higher in sodium, added sugar, and fat. Reduce your daily sodium intake. Many people with hypertension should eat less than 1,500 mg of sodium a day. Do not drink alcohol if: Your health care provider tells you not to drink. You are pregnant, may be pregnant, or are planning to become pregnant. If you drink alcohol: Limit how much you have to: 0-1 drink a day for women. 0-2 drinks a day for men. Know how much alcohol is in your drink. In the [place] one drink equals one 12 oz bottle of beer (355 mL), one 5 oz glass of wine (148 mL), or one 1 oz glass of hard liquor (44 mL). Lifestyle  Work with your health care provider to maintain a healthy body weight or to lose weight. Ask what an ideal weight is for you. Get at least 30 minutes of exercise that causes your heart to beat faster (aerobic exercise) most days of the week. Activities may include walking, swimming, or biking. Include exercise to strengthen your muscles (resistance exercise), such as Pilates or lifting weights, as part of your weekly exercise routine. Try to do these types of exercises for 30 minutes at least 3 days a week. Do not use any products that contain nicotine or tobacco. These products include cigarettes, chewing tobacco, and vaping devices, such as e-cigarettes. If you need help quitting, ask your health care provider. Monitor your blood pressure at home as told by your health care provider. Keep all follow-up visits. This is important. Medicines Take over-the-counter and prescription medicines only as told by your health care provider. Follow directions carefully. Blood  pressure medicines must be taken as prescribed. Do not skip doses of blood pressure medicine. Doing this puts you at risk for problems and can make the medicine less effective. Ask your health care provider about side effects or reactions to medicines that you should watch for. Contact a health care provider if you: Think you are having a reaction to a medicine you are taking. Have headaches that keep coming back (recurring). Feel dizzy. Have swelling in your ankles. Have trouble with your vision. Get help right away if you: Develop a severe headache or confusion. Have unusual weakness or numbness. Feel faint. Have severe pain in your chest or abdomen. Vomit repeatedly. Have trouble breathing. These symptoms may be an emergency. Get help right away. Call 911. Do not wait to see if the symptoms will go away. Do not drive yourself to the hospital. Summary Hypertension is when the force of blood pumping through your arteries is too strong. If this condition is not controlled, it may put you at risk for serious complications. Your personal target blood pressure may vary depending on your medical conditions, your age, and other factors. For most people, a normal blood pressure is less than 120/80. Hypertension is treated with lifestyle changes, medicines, or a combination of both. Lifestyle changes include losing weight, eating a healthy,   low-sodium diet, exercising more, and limiting alcohol. This information is not intended to replace advice given to you by your health care provider. Make sure you discuss any questions you have with your health care provider. Document Revised: 05/04/2021 Document Reviewed: 05/04/2021 Elsevier Patient Education  2024 [employer]  

## 2023-01-23 NOTE — Assessment & Plan Note (Signed)
Using CPAP nightly and sleeps 5-6 hours.

## 2023-01-23 NOTE — Assessment & Plan Note (Signed)
Left knee pain, has had a TKR but he notes his knee hurts at bedtime but better once he gets moving.

## 2023-01-25 ENCOUNTER — Encounter: Payer: Self-pay | Admitting: Family Medicine

## 2023-01-26 ENCOUNTER — Other Ambulatory Visit (INDEPENDENT_AMBULATORY_CARE_PROVIDER_SITE_OTHER): Payer: Medicare Other

## 2023-01-26 DIAGNOSIS — R252 Cramp and spasm: Secondary | ICD-10-CM | POA: Diagnosis not present

## 2023-01-26 DIAGNOSIS — E669 Obesity, unspecified: Secondary | ICD-10-CM | POA: Diagnosis not present

## 2023-01-26 DIAGNOSIS — R351 Nocturia: Secondary | ICD-10-CM | POA: Diagnosis not present

## 2023-01-26 DIAGNOSIS — I10 Essential (primary) hypertension: Secondary | ICD-10-CM

## 2023-01-26 DIAGNOSIS — E782 Mixed hyperlipidemia: Secondary | ICD-10-CM | POA: Diagnosis not present

## 2023-01-26 DIAGNOSIS — M1A9XX0 Chronic gout, unspecified, without tophus (tophi): Secondary | ICD-10-CM | POA: Diagnosis not present

## 2023-01-26 DIAGNOSIS — E1169 Type 2 diabetes mellitus with other specified complication: Secondary | ICD-10-CM

## 2023-01-26 LAB — CBC WITH DIFFERENTIAL/PLATELET
Basophils Absolute: 0.1 10*3/uL (ref 0.0–0.1)
Basophils Relative: 0.9 % (ref 0.0–3.0)
Eosinophils Absolute: 0.5 10*3/uL (ref 0.0–0.7)
Eosinophils Relative: 6.1 % — ABNORMAL HIGH (ref 0.0–5.0)
HCT: 41.2 % (ref 39.0–52.0)
Hemoglobin: 14.3 g/dL (ref 13.0–17.0)
Lymphocytes Relative: 26.6 % (ref 12.0–46.0)
Lymphs Abs: 2.4 10*3/uL (ref 0.7–4.0)
MCHC: 34.8 g/dL (ref 30.0–36.0)
MCV: 85.7 fl (ref 78.0–100.0)
Monocytes Absolute: 1 10*3/uL (ref 0.1–1.0)
Monocytes Relative: 11.2 % (ref 3.0–12.0)
Neutro Abs: 5 10*3/uL (ref 1.4–7.7)
Neutrophils Relative %: 55.2 % (ref 43.0–77.0)
Platelets: 334 10*3/uL (ref 150.0–400.0)
RBC: 4.81 Mil/uL (ref 4.22–5.81)
RDW: 14.1 % (ref 11.5–15.5)
WBC: 9 10*3/uL (ref 4.0–10.5)

## 2023-01-26 LAB — LIPID PANEL
Cholesterol: 120 mg/dL (ref 0–200)
HDL: 29.4 mg/dL — ABNORMAL LOW (ref >39.00)
LDL Cholesterol: 53 mg/dL (ref 0–99)
NonHDL: 90.55
Total CHOL/HDL Ratio: 4
Triglycerides: 187 mg/dL — ABNORMAL HIGH (ref 0.0–149.0)
VLDL: 37.4 mg/dL (ref 0.0–40.0)

## 2023-01-26 LAB — URINALYSIS, ROUTINE W REFLEX MICROSCOPIC
Bilirubin Urine: NEGATIVE
Hgb urine dipstick: NEGATIVE
Ketones, ur: NEGATIVE
Leukocytes,Ua: NEGATIVE
Nitrite: NEGATIVE
RBC / HPF: NONE SEEN (ref 0–?)
Specific Gravity, Urine: 1.01 (ref 1.000–1.030)
Total Protein, Urine: NEGATIVE
Urine Glucose: NEGATIVE
Urobilinogen, UA: 0.2 (ref 0.0–1.0)
pH: 7 (ref 5.0–8.0)

## 2023-01-26 LAB — COMPREHENSIVE METABOLIC PANEL
ALT: 14 U/L (ref 0–53)
AST: 14 U/L (ref 0–37)
Albumin: 4.1 g/dL (ref 3.5–5.2)
Alkaline Phosphatase: 44 U/L (ref 39–117)
BUN: 19 mg/dL (ref 6–23)
CO2: 32 mEq/L (ref 19–32)
Calcium: 9.5 mg/dL (ref 8.4–10.5)
Chloride: 100 mEq/L (ref 96–112)
Creatinine, Ser: 1.03 mg/dL (ref 0.40–1.50)
GFR: 73.27 mL/min (ref >60.00)
Glucose, Bld: 147 mg/dL — ABNORMAL HIGH (ref 70–99)
Potassium: 3.7 mEq/L (ref 3.5–5.1)
Sodium: 139 mEq/L (ref 135–145)
Total Bilirubin: 0.7 mg/dL (ref 0.2–1.2)
Total Protein: 6.4 g/dL (ref 6.0–8.3)

## 2023-01-26 LAB — PSA: PSA: 0.75 ng/mL (ref 0.10–4.00)

## 2023-01-26 LAB — TSH: TSH: 2.49 u[IU]/mL (ref 0.35–5.50)

## 2023-01-26 LAB — HEMOGLOBIN A1C: Hgb A1c MFr Bld: 6.9 % — ABNORMAL HIGH (ref 4.6–6.5)

## 2023-01-26 LAB — URIC ACID: Uric Acid, Serum: 6.6 mg/dL (ref 4.0–7.8)

## 2023-01-26 LAB — MAGNESIUM: Magnesium: 1.6 mg/dL (ref 1.5–2.5)

## 2023-01-27 ENCOUNTER — Other Ambulatory Visit: Payer: Self-pay

## 2023-01-27 ENCOUNTER — Telehealth: Payer: Self-pay | Admitting: Neurology

## 2023-01-27 ENCOUNTER — Telehealth: Payer: Self-pay

## 2023-01-27 DIAGNOSIS — E669 Obesity, unspecified: Secondary | ICD-10-CM

## 2023-01-27 MED ORDER — SITAGLIPTIN PHOSPHATE 100 MG PO TABS: 100.0000 mg | ORAL_TABLET | Freq: Every day | ORAL | 2 refills | Status: DC

## 2023-01-27 MED ORDER — EMPAGLIFLOZIN 10 MG PO TABS: 10.0000 mg | ORAL_TABLET | Freq: Every day | ORAL | 3 refills | Status: DC

## 2023-01-27 MED ORDER — EMPAGLIFLOZIN 10 MG PO TABS: 10.0000 mg | ORAL_TABLET | Freq: Every day | ORAL | 1 refills | Status: DC

## 2023-01-27 NOTE — Telephone Encounter (Signed)
Called pt regarding another medication Januvia 100 mg, pt stated understand. Medication sent to pharmacy.

## 2023-01-27 NOTE — Telephone Encounter (Signed)
Patient called to go over lab results. He reviewed on mychart. Okay to try Jardiance as long as it is covered. RX sent to pharmacy.

## 2023-01-27 NOTE — Telephone Encounter (Signed)
Called pt labs ordered placed future and  Medication sent.

## 2023-01-27 NOTE — Telephone Encounter (Signed)
Pt called stated it will cost too much, pharmacy said It will be 500. Is there anything else he can try because Jardiance cost too much.

## 2023-01-27 NOTE — Telephone Encounter (Signed)
Sent message to provider

## 2023-02-01 ENCOUNTER — Encounter: Payer: Self-pay | Admitting: Podiatry

## 2023-02-01 ENCOUNTER — Ambulatory Visit (INDEPENDENT_AMBULATORY_CARE_PROVIDER_SITE_OTHER): Payer: Medicare Other | Admitting: Podiatry

## 2023-02-01 DIAGNOSIS — B351 Tinea unguium: Secondary | ICD-10-CM | POA: Diagnosis not present

## 2023-02-01 DIAGNOSIS — M79674 Pain in right toe(s): Secondary | ICD-10-CM

## 2023-02-01 DIAGNOSIS — E1142 Type 2 diabetes mellitus with diabetic polyneuropathy: Secondary | ICD-10-CM | POA: Diagnosis not present

## 2023-02-01 DIAGNOSIS — M79675 Pain in left toe(s): Secondary | ICD-10-CM | POA: Diagnosis not present

## 2023-02-01 DIAGNOSIS — L84 Corns and callosities: Secondary | ICD-10-CM

## 2023-02-09 ENCOUNTER — Other Ambulatory Visit: Payer: Self-pay | Admitting: Family Medicine

## 2023-02-09 DIAGNOSIS — E611 Iron deficiency: Secondary | ICD-10-CM

## 2023-02-09 NOTE — Progress Notes (Signed)
  Subjective:  Patient ID: Evan Best, male    DOB: 07-03-1951,  MRN: [id number]  Evan Best presents to clinic today for: preventative diabetic foot care and callus(es) R hallux and painful thick toenails that are difficult to trim. Painful toenails interfere with ambulation. Aggravating factors include wearing enclosed shoe gear. Pain is relieved with periodic professional debridement. Painful calluses are aggravated when weightbearing with and without shoegear. Pain is relieved with periodic professional debridement.  Chief Complaint  Patient presents with   Diabetes    [facility] BS - DIDN'T CHECK IT TODAY  A1C - DK LVPCP - 01/2023    PCP is Bradd Canary, MD.  No Known Allergies  Review of Systems: Negative except as noted in the HPI.  Objective: No changes noted in today's physical examination. There were no vitals filed for this visit.  Evan Best is a pleasant 72 y.o. male in NAD. AAO x 3.  Neurovascular status unchanged b/l: Capillary refill time <3 seconds b/l LE. Palpable pedal pulses b/l LE. Digital hair present b/l. No pedal edema b/l. Skin temperature gradient WNL b/l. No varicosities b/l. Pt has subjective symptoms of neuropathy. Protective sensation intact 5/5 intact bilaterally with 10g monofilament b/l.Marland Kitchen  Dermatological Examination: Pedal skin with normal turgor, texture and tone b/l. No open wounds. No interdigital macerations b/l. Toenails 1-5 b/l thickened, discolored, dystrophic with subungual debris. There is pain on palpation to dorsal aspect of nailplates. Hyperkeratotic lesion(s) right great toe.  No erythema, no edema, no drainage, no fluctuance..  Musculoskeletal Examination: Muscle strength 5/5 to all lower extremity muscle groups bilaterally. No pain, crepitus or joint limitation noted with ROM bilateral LE. No gross bony deformities bilaterally.     Latest Ref Rng & Units 01/26/2023   10:41 AM 09/20/2022    9:30 AM 06/15/2022   11:39 AM   Hemoglobin A1C  Hemoglobin-A1c 4.6 - 6.5 % 6.9  6.3  5.5    Assessment/Plan: 1. Pain due to onychomycosis of toenails of both feet   2. Callus   3. Diabetic peripheral neuropathy associated with type 2 diabetes mellitus (HCC)     -Consent given for treatment as described below: -Examined patient. -Toenails 1-5 bilaterally were debrided in length and girth with sterile nail nippers and dremel. Pinpoint bleeding addressed with Lumicain Hemostatic Solution, cleansed with alcohol. No further treatment required by patient/caregiver. -Callus(es) R hallux pared utilizing sterile scalpel blade without complication or incident. Total number debrided =1. -Patient/POA to call should there be question/concern in the interim.   Return in about 3 months (around 05/04/2023).  [name], DPM

## 2023-02-12 ENCOUNTER — Other Ambulatory Visit: Payer: Self-pay | Admitting: Family Medicine

## 2023-04-02 ENCOUNTER — Other Ambulatory Visit: Payer: Self-pay | Admitting: Family Medicine

## 2023-04-10 DIAGNOSIS — D1801 Hemangioma of skin and subcutaneous tissue: Secondary | ICD-10-CM | POA: Diagnosis not present

## 2023-04-10 DIAGNOSIS — L578 Other skin changes due to chronic exposure to nonionizing radiation: Secondary | ICD-10-CM | POA: Diagnosis not present

## 2023-04-10 DIAGNOSIS — L814 Other melanin hyperpigmentation: Secondary | ICD-10-CM | POA: Diagnosis not present

## 2023-04-10 DIAGNOSIS — D229 Melanocytic nevi, unspecified: Secondary | ICD-10-CM | POA: Diagnosis not present

## 2023-04-10 DIAGNOSIS — L821 Other seborrheic keratosis: Secondary | ICD-10-CM | POA: Diagnosis not present

## 2023-04-10 DIAGNOSIS — Z86018 Personal history of other benign neoplasm: Secondary | ICD-10-CM | POA: Diagnosis not present

## 2023-05-09 ENCOUNTER — Other Ambulatory Visit: Payer: Self-pay | Admitting: Family Medicine

## 2023-05-23 ENCOUNTER — Other Ambulatory Visit (INDEPENDENT_AMBULATORY_CARE_PROVIDER_SITE_OTHER): Payer: Medicare Other

## 2023-05-23 DIAGNOSIS — E1169 Type 2 diabetes mellitus with other specified complication: Secondary | ICD-10-CM | POA: Diagnosis not present

## 2023-05-23 DIAGNOSIS — E669 Obesity, unspecified: Secondary | ICD-10-CM | POA: Diagnosis not present

## 2023-05-23 LAB — HEMOGLOBIN A1C: Hgb A1c MFr Bld: 6.1 % (ref 4.6–6.5)

## 2023-05-29 ENCOUNTER — Encounter: Payer: Self-pay | Admitting: Family Medicine

## 2023-05-29 ENCOUNTER — Ambulatory Visit: Payer: Medicare Other | Admitting: Family Medicine

## 2023-05-29 ENCOUNTER — Ambulatory Visit (INDEPENDENT_AMBULATORY_CARE_PROVIDER_SITE_OTHER): Payer: Medicare Other | Admitting: Family Medicine

## 2023-05-29 VITALS — BP 137/64 | HR 75 | Ht 68.0 in | Wt 202.0 lb

## 2023-05-29 DIAGNOSIS — I10 Essential (primary) hypertension: Secondary | ICD-10-CM

## 2023-05-29 DIAGNOSIS — E669 Obesity, unspecified: Secondary | ICD-10-CM | POA: Diagnosis not present

## 2023-05-29 DIAGNOSIS — E1169 Type 2 diabetes mellitus with other specified complication: Secondary | ICD-10-CM

## 2023-05-29 DIAGNOSIS — E782 Mixed hyperlipidemia: Secondary | ICD-10-CM

## 2023-05-29 DIAGNOSIS — Z7984 Long term (current) use of oral hypoglycemic drugs: Secondary | ICD-10-CM

## 2023-05-29 DIAGNOSIS — Z6830 Body mass index (BMI) 30.0-30.9, adult: Secondary | ICD-10-CM | POA: Diagnosis not present

## 2023-05-29 NOTE — Assessment & Plan Note (Signed)
-  Continue Simvastatin 10mg . Lifestyle factors for lowering cholesterol include: Diet therapy - heart-healthy diet rich in fruits, veggies, fiber-rich whole grains, lean meats, chicken, fish (at least twice a week), fat-free or 1% dairy products; foods low in saturated/trans fats, cholesterol, sodium, and sugar. Mediterranean diet has shown to be very heart healthy. Regular exercise - recommend at least 30 minutes a day, 5 times per week Weight management  Declined labs today. Stable at last check

## 2023-05-29 NOTE — Progress Notes (Signed)
Established Patient Office Visit  Subjective   Patient ID: Evan Best, male    DOB: 09/12/1950  Age: 72 y.o. MRN: [id number]  Chief Complaint  Patient presents with   Medical Management of Chronic Issues    HPI   Discussed the use of AI scribe software for clinical note transcription with the patient, who gave verbal consent to proceed.  History of Present Illness   The patient, with a history of diabetes and hypertension, presents for a routine follow-up.   The patient has been managing their diabetes primarily through diet and metformin, taking two tablets in the morning, one at lunch, and one at dinner. They report maintaining a good A1c level, recently measured at 6.1. They do not regularly check their blood sugar at home but are aware of the symptoms of hyperglycemia. He was previously prescribed Jardiance then changed to Januvia, but states he never took either due to cost. He prefers to focus on lifestyle measures and continue just the metformin.   The patient has been taking amlodipine 10mg , hydrochlorothiazide 25mg , and lisinopril 40mg  for hypertension. They also take simvastatin 10mg  for cholesterol management. They report occasional high blood pressure readings, but attribute these to stressors such as traffic.           Lab Results  Component Value Date   HGBA1C 6.1 05/23/2023        ROS All review of systems negative except what is listed in the HPI    Objective:     BP 137/64   Pulse 75   Ht 5\' 8"  (1.727 m)   Wt 202 lb (91.6 kg)   SpO2 99%   BMI 30.71 kg/m    Physical Exam Vitals reviewed.  Constitutional:      Appearance: Normal appearance.  Cardiovascular:     Rate and Rhythm: Normal rate and regular rhythm.     Heart sounds: Normal heart sounds.  Pulmonary:     Effort: Pulmonary effort is normal.     Breath sounds: Normal breath sounds.  Skin:    General: Skin is warm and dry.  Neurological:     Mental Status: He is alert and  oriented to person, place, and time.  Psychiatric:        Mood and Affect: Mood normal.        Behavior: Behavior normal.        Thought Content: Thought content normal.        Judgment: Judgment normal.      No results found for any visits on 05/29/23.    The ASCVD Risk score (Arnett DK, et al., 2019) failed to calculate for the following reasons:   The valid total cholesterol range is 130 to 320 mg/dL    Assessment & Plan:   Problem List Items Addressed This Visit       Active Problems   Hyperlipidemia, mixed - Primary    -Continue Simvastatin 10mg . Lifestyle factors for lowering cholesterol include: Diet therapy - heart-healthy diet rich in fruits, veggies, fiber-rich whole grains, lean meats, chicken, fish (at least twice a week), fat-free or 1% dairy products; foods low in saturated/trans fats, cholesterol, sodium, and sugar. Mediterranean diet has shown to be very heart healthy. Regular exercise - recommend at least 30 minutes a day, 5 times per week Weight management  Declined labs today. Stable at last check      Essential hypertension    Currently on Amlodipine 10mg , Hydrochlorothiazide 25mg , and Lisinopril 40mg . -Continue current medications. -  Check blood pressure at next visit in 3 months. -Continue lifestyle measures      Diabetes mellitus type 2 in obese    Well-controlled with Metformin 1000mg  twice daily and lifestyle modifications. A1c 6.1. Not taking previously prescribed Jardiance or Januvia due to cost. -Continue Metformin 1000mg  twice daily. -Continue lifestyle modifications. -Check A1c at next visit in 3 months. Lab Results  Component Value Date   HGBA1C 6.1 05/23/2023          Return in about 3 months (around 08/29/2023) for routine follow-up.    Clayborne Dana, NP

## 2023-05-29 NOTE — Assessment & Plan Note (Signed)
Currently on Amlodipine 10mg , Hydrochlorothiazide 25mg , and Lisinopril 40mg . -Continue current medications. -Check blood pressure at next visit in 3 months. -Continue lifestyle measures

## 2023-05-29 NOTE — Assessment & Plan Note (Signed)
Well-controlled with Metformin 1000mg  twice daily and lifestyle modifications. A1c 6.1. Not taking previously prescribed Jardiance or Januvia due to cost. -Continue Metformin 1000mg  twice daily. -Continue lifestyle modifications. -Check A1c at next visit in 3 months. Lab Results  Component Value Date   HGBA1C 6.1 05/23/2023

## 2023-05-31 ENCOUNTER — Encounter: Payer: Self-pay | Admitting: Podiatry

## 2023-05-31 ENCOUNTER — Ambulatory Visit: Payer: Medicare Other | Admitting: Podiatry

## 2023-05-31 DIAGNOSIS — M79675 Pain in left toe(s): Secondary | ICD-10-CM | POA: Diagnosis not present

## 2023-05-31 DIAGNOSIS — E1142 Type 2 diabetes mellitus with diabetic polyneuropathy: Secondary | ICD-10-CM

## 2023-05-31 DIAGNOSIS — B351 Tinea unguium: Secondary | ICD-10-CM

## 2023-05-31 DIAGNOSIS — L84 Corns and callosities: Secondary | ICD-10-CM

## 2023-05-31 DIAGNOSIS — M79674 Pain in right toe(s): Secondary | ICD-10-CM

## 2023-06-07 NOTE — Progress Notes (Signed)
  Subjective:  Patient ID: Evan Best, male    DOB: [birth year],  MRN: [id number]  72 y.o. male presents preventative diabetic foot care. Chief Complaint  Patient presents with   Diabetes    [facility] BS - DIDN'T CHECK IT TODAY  A1C - 6.1 LVPCP - LAST WEEK    New problem(s): None   PCP is Bradd Canary, MD.  No Known Allergies  Review of Systems: Negative except as noted in the HPI.   Objective:  Evan Best is a pleasant 72 y.o. male WD, WN in NAD.AAO x 3.  Vascular Examination: Vascular status intact b/l with palpable pedal pulses. CFT immediate b/l. Pedal hair present. No edema. No pain with calf compression b/l. Skin temperature gradient WNL b/l. No varicosities noted. No cyanosis or clubbing noted.  Neurological Examination: Pt has subjective symptoms of neuropathy. Sensation grossly intact b/l with 10 gram monofilament. Vibratory sensation intact b/l.  Dermatological Examination: Pedal skin with normal turgor, texture and tone b/l. No open wounds nor interdigital macerations noted. Toenails 1-5 b/l thick, discolored, elongated with subungual debris and pain on dorsal palpation.   Hyperkeratotic lesion(s) R hallux.  No erythema, no edema, no drainage, no fluctuance.  Musculoskeletal Examination: Muscle strength 5/5 to b/l LE.  No pain, crepitus noted b/l. No gross pedal deformities. Patient ambulates independently without assistive aids.   Radiographs: None  Last A1c:      Latest Ref Rng & Units 05/23/2023    1:36 PM 01/26/2023   10:41 AM 09/20/2022    9:30 AM 06/15/2022   11:39 AM  Hemoglobin A1C  Hemoglobin-A1c 4.6 - 6.5 % 6.1  6.9  6.3  5.5      Assessment:   1. Pain due to onychomycosis of toenails of both feet   2. Callus   3. Diabetic peripheral neuropathy associated with type 2 diabetes mellitus (HCC)    Plan:  Patient was evaluated and treated. All patient's and/or POA's questions/concerns addressed on today's visit. Mycotic toenails 1-5  debrided in length and girth without incident. Callus(es) right great toe pared with sharp debridement without incident. Continue soft, supportive shoe gear daily. Report any pedal injuries to medical professional. Call office if there are any questions/concerns. -Patient/POA to call should there be question/concern in the interim.  Return in about 3 months (around 08/31/2023).  [name], DPM      [facility] LOCATION: [address] [place] [address]                   Office [phone]   [facility] LOCATION: [address] [place], [place] [address] Office [phone]

## 2023-06-08 ENCOUNTER — Other Ambulatory Visit: Payer: Self-pay | Admitting: Family Medicine

## 2023-06-08 ENCOUNTER — Other Ambulatory Visit: Payer: Self-pay | Admitting: Gastroenterology

## 2023-06-08 DIAGNOSIS — Z860100 Personal history of colon polyps, unspecified: Secondary | ICD-10-CM

## 2023-06-15 ENCOUNTER — Ambulatory Visit: Payer: Medicare Other

## 2023-06-15 VITALS — Ht 71.0 in | Wt 202.0 lb

## 2023-06-15 DIAGNOSIS — Z0000 Encounter for general adult medical examination without abnormal findings: Secondary | ICD-10-CM

## 2023-06-15 NOTE — Patient Instructions (Addendum)
Evan Best , Thank you for taking time to come for your Medicare Wellness Visit. I appreciate your ongoing commitment to your health goals. Please review the following plan we discussed and let me know if I can assist you in the future.   Referrals/Orders/Follow-Ups/Clinician Recommendations:   This is a list of the screening recommended for you and due dates:  Health Maintenance  Topic Date Due   COVID-19 Vaccine (3 - Pfizer risk series) 12/10/2019   Colon Cancer Screening  08/03/2023   Flu Shot  10/09/2023*   DTaP/Tdap/Td vaccine (2 - Td or Tdap) 05/28/2024*   Yearly kidney health urinalysis for diabetes  09/20/2023   Complete foot exam   09/26/2023   Hemoglobin A1C  11/20/2023   Eye exam for diabetics  12/13/2023   Yearly kidney function blood test for diabetes  01/26/2024   Medicare Annual Wellness Visit  06/14/2024   Pneumonia Vaccine  Completed   Hepatitis C Screening  Completed   Zoster (Shingles) Vaccine  Completed   HPV Vaccine  Aged Out  *Topic was postponed. The date shown is not the original due date.    Advanced directives: (Copy Requested) Please bring a copy of your health care power of attorney and living will to the office to be added to your chart at your convenience.  Next Medicare Annual Wellness Visit scheduled for next year: Yes

## 2023-06-15 NOTE — Progress Notes (Signed)
Subjective:   Evan Best is a 72 y.o. male who presents for Medicare Annual/Subsequent preventive examination.  Visit Complete: Virtual I connected with  Evan Best on 06/15/23 by a audio enabled telemedicine application and verified that I am speaking with the correct person using two identifiers.  Patient Location: Home  Provider Location: Home Office  I discussed the limitations of evaluation and management by telemedicine. The patient expressed understanding and agreed to proceed.  Vital Signs: Because this visit was a virtual/telehealth visit, some criteria may be missing or patient reported. Any vitals not documented were not able to be obtained and vitals that have been documented are patient reported.  Patient Medicare AWV questionnaire was completed by the patient on 06/09/23; I have confirmed that all information answered by patient is correct and no changes since this date.  Cardiac Risk Factors include: advanced age (>4men, >32 women);male gender;diabetes mellitus;hypertension     Objective:    Today's Vitals   06/15/23 1553  Weight: 202 lb (91.6 kg)  Height: 5\' 11"  (1.803 m)   Body mass index is 28.17 kg/m.     06/15/2023    4:02 PM 05/11/2022    3:31 AM 05/11/2021    1:04 PM 03/22/2019    3:16 PM 03/15/2018    2:35 PM 03/15/2018    2:32 PM  Advanced Directives  Does Patient Have a Medical Advance Directive? Yes No No No No Yes  Type of Estate agent of [place];Living will       Does patient want to make changes to medical advance directive?     No - Patient declined   Copy of Healthcare Power of Attorney in Chart? No - copy requested       Would patient like information on creating a medical advance directive?  No - Patient declined No - Patient declined No - Patient declined      Current Medications (verified) Outpatient Encounter Medications as of 06/15/2023  Medication Sig   amLODipine (NORVASC) 10 MG tablet TAKE 1 TABLET  BY MOUTH EVERY DAY   Calcium-Magnesium-Zinc 1000-400-15 MG TABS Take by mouth daily.   cetirizine (ZYRTEC) 10 MG tablet Take 10 mg by mouth daily.   Chelated Potassium 99 MG TABS Take 99 mg by mouth 2 (two) times daily.   Cholecalciferol (D3 ADULT PO) Take by mouth.   Cinnamon 500 MG capsule Take 500 mg 3 (three) times daily by mouth. +50 mg Chromium in each supplement.   Coenzyme Q10 (CO Q-10) 100 MG CAPS Take 200 mg by mouth daily.   Cyanocobalamin (VITAMIN B 12 PO) Take 1 capsule daily by mouth.   famotidine (PEPCID) 40 MG tablet TAKE 1 TABLET BY MOUTH EVERY DAY   ferrous sulfate 325 (65 FE) MG tablet Take 1 tablet (325 mg total) by mouth daily with breakfast.   Glucosamine-Chondroitin-Vit D3 1500-1200-800 MG-MG-UNIT PACK Take by mouth.   glucose blood test strip One touch Ultra blue -Check sugars daily-  DX E11.69   hydrochlorothiazide (HYDRODIURIL) 25 MG tablet TAKE 1 TABLET (25 MG TOTAL) BY MOUTH DAILY.   KLOR-CON M20 20 MEQ tablet TAKE 2 TABLET BY MOUTH DAILY FOR 5 DAYS THEN DECREASE TO 1 TABLET DAILY   lisinopril (ZESTRIL) 40 MG tablet TAKE 1 TABLET BY MOUTH EVERY DAY   Melatonin 10 MG TABS Take by mouth.   metFORMIN (GLUCOPHAGE) 500 MG tablet TAKE 2 TABLETS (1,000 MG TOTAL) BY MOUTH 2 (TWO) TIMES DAILY WITH A MEAL.   Misc  Natural Products (TART CHERRY ADVANCED PO) Take by mouth.   Omega-3 Fatty Acids (FISH OIL PO) Take 1,500 mg by mouth daily.   Quercetin 250 MG TABS Take by mouth 2 (two) times daily.    simvastatin (ZOCOR) 10 MG tablet Take 1 tablet (10 mg total) by mouth at bedtime.   TURMERIC PO Take by mouth.   vitamin C (ASCORBIC ACID) 500 MG tablet Take 500 mg by mouth 3 (three) times daily.   No facility-administered encounter medications on file as of 06/15/2023.    Allergies (verified) Patient has no known allergies.   History: Past Medical History:  Diagnosis Date   Allergic state 09/22/2013   Anemia 09/07/2015   Arthritis 03/06/2015   Diabetes mellitus type II 1/09    Dysphagia 05/04/2017   Esophageal reflux 03/06/2015   Gout 09/18/2012   Left big toe   Hypertension    Insomnia 09/13/2015   Migraine headache    remote history   OSA (obstructive sleep apnea)    Preventative health care 09/05/2014   Right shoulder pain 02/19/2009   Qualifier: Diagnosis of  By: Nena Jordan    RLS (restless legs syndrome) 09/07/2015   Sleep apnea    uses cpap   Tremor 09/07/2015   Past Surgical History:  Procedure Laterality Date   HERNIA REPAIR  1998   umbilical hernia repair   KNEE SURGERY     left knee surgery   TONSILLECTOMY  1959   VASECTOMY     Family History  Problem Relation Age of Onset   Breast cancer Mother    Hypertension Mother    Hyperlipidemia Father    Kidney disease Father    Heart disease Father    Hypertension Father    Colon cancer Neg Hx    Stomach cancer Neg Hx    Colon polyps Neg Hx    Esophageal cancer Neg Hx    Rectal cancer Neg Hx    Social History   Socioeconomic History   Marital status: Widowed    Spouse name: Not on file   Number of children: 1   Years of education: Not on file   Highest education level: Bachelor's degree (e.g., BA, AB, BS)  Occupational History   Not on file  Tobacco Use   Smoking status: Former    Current packs/day: 0.00    Types: Cigarettes    Start date: 2000    Quit date: 2005    Years since quitting: 19.9   Smokeless tobacco: Never   Tobacco comments:    1 pack per year  Vaping Use   Vaping status: Never Used  Substance and Sexual Activity   Alcohol use: No   Drug use: No   Sexual activity: Yes  Other Topics Concern   Not on file  Social History Narrative   Occupation:  Energy manager   Married  -  1 daughter (age 73)    Never Smoked   Alcohol use-no     Drug use-no          Social Determinants of Health   Financial Resource Strain: Low Risk  (06/09/2023)   Overall Financial Resource Strain (CARDIA)    Difficulty of Paying Living Expenses: Not very hard  Food  Insecurity: No Food Insecurity (06/09/2023)   Hunger Vital Sign    Worried About Running Out of Food in the Last Year: Never true    Ran Out of Food in the Last Year: Never true  Transportation Needs:  No Transportation Needs (06/09/2023)   PRAPARE - Administrator, Civil Service (Medical): No    Lack of Transportation (Non-Medical): No  Physical Activity: Inactive (06/09/2023)   Exercise Vital Sign    Days of Exercise per Week: 0 days    Minutes of Exercise per Session: 0 min  Stress: No Stress Concern Present (06/09/2023)   [employer] of Occupational Health - Occupational Stress Questionnaire    Feeling of Stress : Not at all  Social Connections: Socially Isolated (06/09/2023)   Social Connection and Isolation Panel [NHANES]    Frequency of Communication with Friends and Family: Never    Frequency of Social Gatherings with Friends and Family: More than three times a week    Attends Religious Services: Never    Database administrator or Organizations: No    Attends Banker Meetings: Never    Marital Status: Widowed    Tobacco Counseling Counseling given: Not Answered Tobacco comments: 1 pack per year   Clinical Intake:  Pre-visit preparation completed: Yes  Pain : No/denies pain     BMI - recorded: 28.17 Nutritional Status: BMI 25 -29 Overweight Nutritional Risks: None Diabetes: Yes CBG done?: No Did pt. bring in CBG monitor from home?: No  How often do you need to have someone help you when you read instructions, pamphlets, or other written materials from your doctor or pharmacy?: 2 - Rarely  Interpreter Needed?: No  Information entered by :: Theresa Mulligan LPN   Activities of Daily Living    06/09/2023    2:11 AM  In your present state of health, do you have any difficulty performing the following activities:  Hearing? 0  Vision? 0  Difficulty concentrating or making decisions? 0  Walking or climbing stairs? 0  Dressing or  bathing? 0  Doing errands, shopping? 0  Preparing Food and eating ? N  Using the Toilet? N  In the past six months, have you accidently leaked urine? Y  Comment Followed by PCP  Do you have problems with loss of bowel control? Y  Comment Followed by PCP  Managing your Medications? N  Managing your Finances? N  Housekeeping or managing your Housekeeping? N    Patient Care Team: Bradd Canary, MD as PCP - General (Family Medicine) Ernesto Rutherford, MD as Consulting Physician (Ophthalmology) Waymon Budge, MD as Consulting Physician (Pulmonary Disease)  Indicate any recent Medical Services you may have received from other than [facility] providers in the past year (date may be approximate).     Assessment:   This is a routine wellness examination for Evan Best.  Hearing/Vision screen Hearing Screening - Comments:: Denies hearing difficulties   Vision Screening - Comments:: Wears reading glasses - up to date with routine eye exams with  Dr Dione Booze   Goals Addressed               This Visit's Progress     Maintain current health (pt-stated)         Depression Screen    06/15/2023    4:07 PM 01/23/2023    3:33 PM 09/12/2022    3:52 PM 06/16/2022    3:14 PM 06/16/2022    3:13 PM 05/31/2022    8:03 PM 05/31/2022    5:50 PM  PHQ 2/9 Scores  PHQ - 2 Score 0 0 0 0 0  2  PHQ- 9 Score   0 1     Exception Documentation  Other- indicate reason in comment box   Not completed      patient currently greiving, denies SI or thoughts of harm, he declines evaluation or services to assist with chronic depressed mood.     Fall Risk    06/15/2023    4:01 PM 06/09/2023    2:11 AM 01/23/2023    3:33 PM 09/12/2022    3:52 PM 06/16/2022    3:13 PM  Fall Risk   Falls in the past year? 0 0 0 0 0  Number falls in past yr: 0 0 0 0 0  Injury with Fall? 0 0 0 0 0  Risk for fall due to : No Fall Risks      Follow up Falls prevention discussed  Falls evaluation completed Falls evaluation completed  Falls evaluation completed    MEDICARE RISK AT HOME: Medicare Risk at Home Any stairs in or around the home?: Yes If so, are there any without handrails?: No Home free of loose throw rugs in walkways, pet beds, electrical cords, etc?: Yes Adequate lighting in your home to reduce risk of falls?: Yes Life alert?: No Use of a cane, walker or w/c?: No Grab bars in the bathroom?: No Shower chair or bench in shower?: No Elevated toilet seat or a handicapped toilet?: No  TIMED UP AND GO:  Was the test performed?  No    Cognitive Function:        06/15/2023    4:02 PM  6CIT Screen  What Year? 0 points  What month? 0 points  What time? 0 points  Count back from 20 0 points  Months in reverse 0 points  Repeat phrase 2 points  Total Score 2 points    Immunizations Immunization History  Administered Date(s) Administered   Fluad Quad(high Dose 65+) 04/24/2019, 06/01/2020   Influenza Split 05/13/2011, 05/11/2012   Influenza Whole 05/13/2009, 05/14/2010, 04/10/2013   Influenza, High Dose Seasonal PF 05/04/2017   Influenza-Unspecified 04/11/2014, 04/17/2015, 04/10/2018   PFIZER(Purple Top)SARS-COV-2 Vaccination 10/18/2019, 11/12/2019   Pneumococcal Conjugate-13 05/04/2017   Pneumococcal Polysaccharide-23 05/24/2018   Tdap 06/21/2011   Zoster Recombinant(Shingrix) 02/15/2018, 07/09/2018    TDAP status: Due, Education has been provided regarding the importance of this vaccine. Advised may receive this vaccine at local pharmacy or Health Dept. Aware to provide a copy of the vaccination record if obtained from local pharmacy or Health Dept. Verbalized acceptance and understanding.  Flu Vaccine status: Declined, Education has been provided regarding the importance of this vaccine but patient still declined. Advised may receive this vaccine at local pharmacy or Health Dept. Aware to provide a copy of the vaccination record if obtained from local pharmacy or Health Dept. Verbalized  acceptance and understanding.  Pneumococcal vaccine status: Up to date  Covid-19 vaccine status: Declined, Education has been provided regarding the importance of this vaccine but patient still declined. Advised may receive this vaccine at local pharmacy or Health Dept.or vaccine clinic. Aware to provide a copy of the vaccination record if obtained from local pharmacy or Health Dept. Verbalized acceptance and understanding.  Qualifies for Shingles Vaccine? Yes   Zostavax completed Yes   Shingrix Completed?: Yes  Screening Tests Health Maintenance  Topic Date Due   COVID-19 Vaccine (3 - Pfizer risk series) 12/10/2019   Colonoscopy  08/03/2023   INFLUENZA VACCINE  10/09/2023 (Originally 02/09/2023)   DTaP/Tdap/Td (2 - Td or Tdap) 05/28/2024 (Originally 06/20/2021)   Diabetic kidney evaluation - Urine ACR  09/20/2023   FOOT EXAM  09/26/2023   HEMOGLOBIN A1C  11/20/2023   OPHTHALMOLOGY EXAM  12/13/2023   Diabetic kidney evaluation - eGFR measurement  01/26/2024   Medicare Annual Wellness (AWV)  06/14/2024   Pneumonia Vaccine 41+ Years old  Completed   Hepatitis C Screening  Completed   Zoster Vaccines- Shingrix  Completed   HPV VACCINES  Aged Out    Health Maintenance  Health Maintenance Due  Topic Date Due   COVID-19 Vaccine (3 - Pfizer risk series) 12/10/2019   Colonoscopy  08/03/2023    Colorectal cancer screening: Type of screening: Colonoscopy. Completed 08/02/22. Repeat every   years    Additional Screening:  Hepatitis C Screening: does qualify; Completed 09/01/15  Vision Screening: Recommended annual ophthalmology exams for early detection of glaucoma and other disorders of the eye. Is the patient up to date with their annual eye exam?  Yes  Who is the provider or what is the name of the office in which the patient attends annual eye exams? Dr Dione Booze If pt is not established with a provider, would they like to be referred to a provider to establish care? No .   Dental  Screening: Recommended annual dental exams for proper oral hygiene  Diabetic Foot Exam: Diabetic Foot Exam: Completed 09/26/22  Community Resource Referral / Chronic Care Management:  CRR required this visit?  No   CCM required this visit?  No     Plan:     I have personally reviewed and noted the following in the patient's chart:   Medical and social history Use of alcohol, tobacco or illicit drugs  Current medications and supplements including opioid prescriptions. Patient is not currently taking opioid prescriptions. Functional ability and status Nutritional status Physical activity Advanced directives List of other physicians Hospitalizations, surgeries, and ER visits in previous 12 months Vitals Screenings to include cognitive, depression, and falls Referrals and appointments  In addition, I have reviewed and discussed with patient certain preventive protocols, quality metrics, and best practice recommendations. A written personalized care plan for preventive services as well as general preventive health recommendations were provided to patient.     Tillie Rung, LPN   [id number]   After Visit Summary: (MyChart) Due to this being a telephonic visit, the after visit summary with patients personalized plan was offered to patient via MyChart   Nurse Notes: None

## 2023-06-28 ENCOUNTER — Other Ambulatory Visit: Payer: Self-pay | Admitting: Family Medicine

## 2023-08-04 ENCOUNTER — Other Ambulatory Visit: Payer: Self-pay | Admitting: Family Medicine

## 2023-08-04 DIAGNOSIS — E611 Iron deficiency: Secondary | ICD-10-CM

## 2023-09-02 ENCOUNTER — Other Ambulatory Visit: Payer: Self-pay | Admitting: Family Medicine

## 2023-09-03 NOTE — Assessment & Plan Note (Signed)
 Encouraged good sleep hygiene such as dark, quiet room. No blue/green glowing lights such as computer screens in bedroom. No alcohol or stimulants in evening. Cut down on caffeine as able. Regular exercise is helpful but not just prior to bed time.

## 2023-09-03 NOTE — Assessment & Plan Note (Signed)
 Tolerating statin, encouraged heart healthy diet, avoid trans fats, minimize simple carbs and saturated fats. Increase exercise as tolerated

## 2023-09-03 NOTE — Assessment & Plan Note (Signed)
 Well controlled, no changes to meds. Encouraged heart healthy diet such as the DASH diet and exercise as tolerated.

## 2023-09-03 NOTE — Assessment & Plan Note (Signed)
 Hydrate and monitor

## 2023-09-03 NOTE — Assessment & Plan Note (Signed)
 [id number] acceptable, minimize simple carbs. Increase exercise as tolerated. Continue current meds

## 2023-09-05 ENCOUNTER — Other Ambulatory Visit: Payer: Medicare Other

## 2023-09-05 ENCOUNTER — Other Ambulatory Visit: Payer: Self-pay | Admitting: Family Medicine

## 2023-09-05 DIAGNOSIS — M1A9XX0 Chronic gout, unspecified, without tophus (tophi): Secondary | ICD-10-CM

## 2023-09-05 DIAGNOSIS — M791 Myalgia, unspecified site: Secondary | ICD-10-CM

## 2023-09-05 DIAGNOSIS — E1169 Type 2 diabetes mellitus with other specified complication: Secondary | ICD-10-CM

## 2023-09-05 DIAGNOSIS — E782 Mixed hyperlipidemia: Secondary | ICD-10-CM

## 2023-09-05 DIAGNOSIS — I10 Essential (primary) hypertension: Secondary | ICD-10-CM

## 2023-09-06 ENCOUNTER — Other Ambulatory Visit (INDEPENDENT_AMBULATORY_CARE_PROVIDER_SITE_OTHER): Payer: Medicare Other

## 2023-09-06 DIAGNOSIS — E669 Obesity, unspecified: Secondary | ICD-10-CM | POA: Diagnosis not present

## 2023-09-06 DIAGNOSIS — M1A9XX0 Chronic gout, unspecified, without tophus (tophi): Secondary | ICD-10-CM | POA: Diagnosis not present

## 2023-09-06 DIAGNOSIS — E1169 Type 2 diabetes mellitus with other specified complication: Secondary | ICD-10-CM | POA: Diagnosis not present

## 2023-09-06 DIAGNOSIS — I10 Essential (primary) hypertension: Secondary | ICD-10-CM | POA: Diagnosis not present

## 2023-09-06 DIAGNOSIS — E782 Mixed hyperlipidemia: Secondary | ICD-10-CM | POA: Diagnosis not present

## 2023-09-06 DIAGNOSIS — M791 Myalgia, unspecified site: Secondary | ICD-10-CM

## 2023-09-06 LAB — COMPREHENSIVE METABOLIC PANEL
ALT: 19 U/L (ref 0–53)
AST: 16 U/L (ref 0–37)
Albumin: 4 g/dL (ref 3.5–5.2)
Alkaline Phosphatase: 39 U/L (ref 39–117)
BUN: 22 mg/dL (ref 6–23)
CO2: 29 meq/L (ref 19–32)
Calcium: 8.9 mg/dL (ref 8.4–10.5)
Chloride: 102 meq/L (ref 96–112)
Creatinine, Ser: 1.02 mg/dL (ref 0.40–1.50)
GFR: 73.82 mL/min (ref >60.00)
Glucose, Bld: 175 mg/dL — ABNORMAL HIGH (ref 70–99)
Potassium: 3.6 meq/L (ref 3.5–5.1)
Sodium: 139 meq/L (ref 135–145)
Total Bilirubin: 0.6 mg/dL (ref 0.2–1.2)
Total Protein: 6.8 g/dL (ref 6.0–8.3)

## 2023-09-06 LAB — LIPID PANEL
Cholesterol: 109 mg/dL (ref 0–200)
HDL: 27.7 mg/dL — ABNORMAL LOW (ref >39.00)
LDL Cholesterol: 56 mg/dL (ref 0–99)
NonHDL: 81.44
Total CHOL/HDL Ratio: 4
Triglycerides: 126 mg/dL (ref 0.0–149.0)
VLDL: 25.2 mg/dL (ref 0.0–40.0)

## 2023-09-06 LAB — MICROALBUMIN / CREATININE URINE RATIO
Creatinine,U: 138.1 mg/dL
Microalb Creat Ratio: 121.7 mg/g — ABNORMAL HIGH (ref 0.0–30.0)
Microalb, Ur: 16.8 mg/dL — ABNORMAL HIGH (ref 0.0–1.9)

## 2023-09-06 LAB — MAGNESIUM: Magnesium: 1.7 mg/dL (ref 1.5–2.5)

## 2023-09-06 LAB — CBC WITH DIFFERENTIAL/PLATELET
Basophils Absolute: 0.1 10*3/uL (ref 0.0–0.1)
Basophils Relative: 1.3 % (ref 0.0–3.0)
Eosinophils Absolute: 0.7 10*3/uL (ref 0.0–0.7)
Eosinophils Relative: 7.8 % — ABNORMAL HIGH (ref 0.0–5.0)
HCT: 41.1 % (ref 39.0–52.0)
Hemoglobin: 14.5 g/dL (ref 13.0–17.0)
Lymphocytes Relative: 23.1 % (ref 12.0–46.0)
Lymphs Abs: 2.1 10*3/uL (ref 0.7–4.0)
MCHC: 35.4 g/dL (ref 30.0–36.0)
MCV: 86.7 fL (ref 78.0–100.0)
Monocytes Absolute: 1 10*3/uL (ref 0.1–1.0)
Monocytes Relative: 11.2 % (ref 3.0–12.0)
Neutro Abs: 5.1 10*3/uL (ref 1.4–7.7)
Neutrophils Relative %: 56.6 % (ref 43.0–77.0)
Platelets: 329 10*3/uL (ref 150.0–400.0)
RBC: 4.74 Mil/uL (ref 4.22–5.81)
RDW: 13.8 % (ref 11.5–15.5)
WBC: 9 10*3/uL (ref 4.0–10.5)

## 2023-09-06 LAB — URIC ACID: Uric Acid, Serum: 6.9 mg/dL (ref 4.0–7.8)

## 2023-09-06 LAB — TSH: TSH: 3.43 u[IU]/mL (ref 0.35–5.50)

## 2023-09-06 LAB — HEMOGLOBIN A1C: Hgb A1c MFr Bld: 7.7 % — ABNORMAL HIGH (ref 4.6–6.5)

## 2023-09-07 ENCOUNTER — Encounter: Payer: Self-pay | Admitting: Family Medicine

## 2023-09-07 ENCOUNTER — Ambulatory Visit (INDEPENDENT_AMBULATORY_CARE_PROVIDER_SITE_OTHER): Payer: Medicare Other | Admitting: Family Medicine

## 2023-09-07 VITALS — BP 138/78 | HR 76 | Temp 97.8°F | Resp 18 | Ht 71.0 in | Wt 207.0 lb

## 2023-09-07 DIAGNOSIS — M1A9XX0 Chronic gout, unspecified, without tophus (tophi): Secondary | ICD-10-CM | POA: Diagnosis not present

## 2023-09-07 DIAGNOSIS — E782 Mixed hyperlipidemia: Secondary | ICD-10-CM

## 2023-09-07 DIAGNOSIS — I10 Essential (primary) hypertension: Secondary | ICD-10-CM

## 2023-09-07 DIAGNOSIS — E669 Obesity, unspecified: Secondary | ICD-10-CM

## 2023-09-07 DIAGNOSIS — G4700 Insomnia, unspecified: Secondary | ICD-10-CM | POA: Diagnosis not present

## 2023-09-07 DIAGNOSIS — R252 Cramp and spasm: Secondary | ICD-10-CM

## 2023-09-07 DIAGNOSIS — E1169 Type 2 diabetes mellitus with other specified complication: Secondary | ICD-10-CM

## 2023-09-07 NOTE — Patient Instructions (Addendum)
 RSV, Respiratory Syncitial Virus Vaccine, Arexvy Vaccine at pharmacy  Prevnar 20 vaccine at pharmacy once

## 2023-09-07 NOTE — Progress Notes (Signed)
 Subjective:    Patient ID: Evan Best, male    DOB: 08/22/1950, 73 y.o.   MRN: [id number]  Chief Complaint  Patient presents with  . Follow-up    HPI Discussed the use of AI scribe software for clinical note transcription with the patient, who gave verbal consent to proceed.  History of Present Illness Evan Best is a 73 year old male who presents for a follow-up visit.  He has insulin resistance and notes a slight elevation in his A1c, which he attributes to recent Easter candy consumption. He is not on any new medications for insulin resistance and manages his condition through diet and lifestyle modifications. He monitors his blood sugar levels as needed.  He has a history of anemia, which he manages with iron supplements. He takes iron on a reduced schedule, adjusting the frequency based on his platelet donation schedule. He donates platelets regularly and has noticed fluctuations in his red blood cell count, which he manages by adjusting his iron intake.  He experiences chronic pain in his left knee, which varies in intensity but is generally tolerable.  He reports bilateral foot pain, describing it as feeling like he has been 'stepped on by someone weighing 250 to 300 pounds.'  He experiences urinary urgency and frequency, noting that descending stairs seems to exacerbate his symptoms.  He reports fatigue and reduced stamina, often needing to lie down after being awake for a few hours. He typically sleeps about six hours per night, which he considers a good night.  He lives with his daughter Evan Best and her partner, who have moved in with him. He has a cat that he cares for, which he describes as considering him her servant.    Past Medical History:  Diagnosis Date  . Allergic state 09/22/2013  . Anemia 09/07/2015  . Arthritis 03/06/2015  . Diabetes mellitus type II 1/09  . Dysphagia 05/04/2017  . Esophageal reflux 03/06/2015  . Gout 09/18/2012   Left big toe  .  Hypertension   . Insomnia 09/13/2015  . Migraine headache    remote history  . OSA (obstructive sleep apnea)   . Preventative health care 09/05/2014  . Right shoulder pain 02/19/2009   Qualifier: Diagnosis of  By: Nena Jordan   . RLS (restless legs syndrome) 09/07/2015  . Sleep apnea    uses cpap  . Tremor 09/07/2015    Past Surgical History:  Procedure Laterality Date  . HERNIA REPAIR  1998   umbilical hernia repair  . KNEE SURGERY     left knee surgery  . TONSILLECTOMY  1959  . VASECTOMY      Family History  Problem Relation Age of Onset  . Breast cancer Mother   . Hypertension Mother   . Hyperlipidemia Father   . Kidney disease Father   . Heart disease Father   . Hypertension Father   . Colon cancer Neg Hx   . Stomach cancer Neg Hx   . Colon polyps Neg Hx   . Esophageal cancer Neg Hx   . Rectal cancer Neg Hx     Social History   Socioeconomic History  . Marital status: Widowed    Spouse name: Not on file  . Number of children: 1  . Years of education: Not on file  . Highest education level: Bachelor's degree (e.g., BA, AB, BS)  Occupational History  . Not on file  Tobacco Use  . Smoking status: Former    Current  packs/day: 0.00    Types: Cigarettes    Start date: 2000    Quit date: 2005    Years since quitting: 20.1  . Smokeless tobacco: Never  . Tobacco comments:    1 pack per year  Vaping Use  . Vaping status: Never Used  Substance and Sexual Activity  . Alcohol use: No  . Drug use: No  . Sexual activity: Yes  Other Topics Concern  . Not on file  Social History Narrative   Occupation:  Energy manager   Married  -  1 daughter (age 75)    Never Smoked   Alcohol use-no     Drug use-no          Social Drivers of Health   Financial Resource Strain: Low Risk  (08/31/2023)   Overall Financial Resource Strain (CARDIA)   . Difficulty of Paying Living Expenses: Not hard at all  Food Insecurity: No Food Insecurity (08/31/2023)   Hunger  Vital Sign   . Worried About Programme researcher, broadcasting/film/video in the Last Year: Never true   . Ran Out of Food in the Last Year: Never true  Transportation Needs: No Transportation Needs (08/31/2023)   PRAPARE - Transportation   . Lack of Transportation (Medical): No   . Lack of Transportation (Non-Medical): No  Physical Activity: Inactive (08/31/2023)   Exercise Vital Sign   . Days of Exercise per Week: 0 days   . Minutes of Exercise per Session: 0 min  Stress: No Stress Concern Present (08/31/2023)   [employer] of Occupational Health - Occupational Stress Questionnaire   . Feeling of Stress : Not at all  Social Connections: Socially Isolated (08/31/2023)   Social Connection and Isolation Panel [NHANES]   . Frequency of Communication with Friends and Family: Never   . Frequency of Social Gatherings with Friends and Family: More than three times a week   . Attends Religious Services: Never   . Active Member of Clubs or Organizations: No   . Attends Banker Meetings: Never   . Marital Status: Widowed  Intimate Partner Violence: Not At Risk (06/15/2023)   Humiliation, Afraid, Rape, and Kick questionnaire   . Fear of Current or Ex-Partner: No   . Emotionally Abused: No   . Physically Abused: No   . Sexually Abused: No    Outpatient Medications Prior to Visit  Medication Sig Dispense Refill  . amLODipine (NORVASC) 10 MG tablet TAKE 1 TABLET BY MOUTH EVERY DAY 90 tablet 1  . Calcium-Magnesium-Zinc 1000-400-15 MG TABS Take by mouth daily.    . cetirizine (ZYRTEC) 10 MG tablet Take 10 mg by mouth daily.    . Chelated Potassium 99 MG TABS Take 99 mg by mouth 2 (two) times daily.    . Cholecalciferol (D3 ADULT PO) Take by mouth.    . Cinnamon 500 MG capsule Take 500 mg 3 (three) times daily by mouth. +50 mg Chromium in each supplement.    . Coenzyme Q10 (CO Q-10) 100 MG CAPS Take 200 mg by mouth daily.    . Cyanocobalamin (VITAMIN B 12 PO) Take 1 capsule daily by mouth.    .  famotidine (PEPCID) 40 MG tablet TAKE 1 TABLET BY MOUTH EVERY DAY 90 tablet 1  . ferrous sulfate 325 (65 FE) MG tablet TAKE 1 TABLET BY MOUTH EVERY DAY WITH BREAKFAST 90 tablet 1  . Glucosamine-Chondroitin-Vit D3 1500-1200-800 MG-MG-UNIT PACK Take by mouth.    Marland Kitchen glucose blood test strip One touch  Ultra blue -Check sugars daily-  DX E11.69 100 each 11  . hydrochlorothiazide (HYDRODIURIL) 25 MG tablet TAKE 1 TABLET (25 MG TOTAL) BY MOUTH DAILY. 90 tablet 1  . KLOR-CON M20 20 MEQ tablet TAKE 2 TABLET BY MOUTH DAILY FOR 5 DAYS THEN DECREASE TO 1 TABLET DAILY 90 tablet 1  . lisinopril (ZESTRIL) 40 MG tablet TAKE 1 TABLET BY MOUTH EVERY DAY 90 tablet 1  . Melatonin 10 MG TABS Take by mouth.    . metFORMIN (GLUCOPHAGE) 500 MG tablet TAKE 2 TABLETS (1,000 MG TOTAL) BY MOUTH 2 (TWO) TIMES DAILY WITH A MEAL. 360 tablet 1  . Misc Natural Products (TART CHERRY ADVANCED PO) Take by mouth.    . Omega-3 Fatty Acids (FISH OIL PO) Take 1,500 mg by mouth daily.    . Quercetin 250 MG TABS Take by mouth 2 (two) times daily.     . simvastatin (ZOCOR) 10 MG tablet Take 1 tablet (10 mg total) by mouth at bedtime. 90 tablet 0  . TURMERIC PO Take by mouth.    . vitamin C (ASCORBIC ACID) 500 MG tablet Take 500 mg by mouth 3 (three) times daily.     No facility-administered medications prior to visit.    No Known Allergies  Review of Systems  Constitutional:  Positive for malaise/fatigue. Negative for fever.  HENT:  Negative for congestion.   Eyes:  Negative for blurred vision.  Respiratory:  Positive for shortness of breath.   Cardiovascular:  Negative for chest pain, palpitations and leg swelling.  Gastrointestinal:  Negative for abdominal pain, blood in stool and nausea.  Genitourinary:  Positive for frequency and urgency. Negative for dysuria.  Musculoskeletal:  Positive for joint pain. Negative for falls.  Skin:  Negative for rash.  Neurological:  Negative for dizziness, loss of consciousness and headaches.   Endo/Heme/Allergies:  Negative for environmental allergies.  Psychiatric/Behavioral:  Negative for depression. The patient is not nervous/anxious.        Objective:    Physical Exam Vitals reviewed.  Constitutional:      Appearance: Normal appearance. He is not ill-appearing.  HENT:     Head: Normocephalic and atraumatic.     Nose: Nose normal.  Eyes:     Conjunctiva/sclera: Conjunctivae normal.  Cardiovascular:     Rate and Rhythm: Normal rate.     Pulses: Normal pulses.     Heart sounds: Normal heart sounds. No murmur heard. Pulmonary:     Effort: Pulmonary effort is normal.     Breath sounds: Normal breath sounds. No wheezing.  Abdominal:     Palpations: Abdomen is soft. There is no mass.     Tenderness: There is no abdominal tenderness.  Musculoskeletal:     Cervical back: Normal range of motion.     Right lower leg: No edema.     Left lower leg: No edema.  Skin:    General: Skin is warm and dry.  Neurological:     General: No focal deficit present.     Mental Status: He is alert and oriented to person, place, and time.  Psychiatric:        Mood and Affect: Mood normal.    BP 138/78 (BP Location: Right Arm, Patient Position: Sitting, Cuff Size: Normal)   Pulse 76   Temp 97.8 F (36.6 C) (Oral)   Resp 18   Ht 5\' 11"  (1.803 m)   Wt 207 lb (93.9 kg)   SpO2 99%   BMI 28.87 kg/m  Wt Readings from Last 3 Encounters:  09/07/23 207 lb (93.9 kg)  06/15/23 202 lb (91.6 kg)  05/29/23 202 lb (91.6 kg)    Diabetic Foot Exam - Simple   No data filed    Lab Results  Component Value Date   WBC 9.0 09/06/2023   HGB 14.5 09/06/2023   HCT 41.1 09/06/2023   PLT 329.0 09/06/2023   GLUCOSE 175 (H) 09/06/2023   CHOL 109 09/06/2023   TRIG 126.0 09/06/2023   HDL 27.70 (L) 09/06/2023   LDLDIRECT 74.0 08/03/2017   LDLCALC 56 09/06/2023   ALT 19 09/06/2023   AST 16 09/06/2023   NA 139 09/06/2023   K 3.6 09/06/2023   CL 102 09/06/2023   CREATININE 1.02 09/06/2023    BUN 22 09/06/2023   CO2 29 09/06/2023   TSH 3.43 09/06/2023   PSA 0.75 01/26/2023   HGBA1C 7.7 (H) 09/06/2023   MICROALBUR 16.8 (H) 09/06/2023    Lab Results  Component Value Date   TSH 3.43 09/06/2023   Lab Results  Component Value Date   WBC 9.0 09/06/2023   HGB 14.5 09/06/2023   HCT 41.1 09/06/2023   MCV 86.7 09/06/2023   PLT 329.0 09/06/2023   Lab Results  Component Value Date   NA 139 09/06/2023   K 3.6 09/06/2023   CO2 29 09/06/2023   GLUCOSE 175 (H) 09/06/2023   BUN 22 09/06/2023   CREATININE 1.02 09/06/2023   BILITOT 0.6 09/06/2023   ALKPHOS 39 09/06/2023   AST 16 09/06/2023   ALT 19 09/06/2023   PROT 6.8 09/06/2023   ALBUMIN 4.0 09/06/2023   CALCIUM 8.9 09/06/2023   GFR 73.82 09/06/2023   Lab Results  Component Value Date   CHOL 109 09/06/2023   Lab Results  Component Value Date   HDL 27.70 (L) 09/06/2023   Lab Results  Component Value Date   LDLCALC 56 09/06/2023   Lab Results  Component Value Date   TRIG 126.0 09/06/2023   Lab Results  Component Value Date   CHOLHDL 4 09/06/2023   Lab Results  Component Value Date   HGBA1C 7.7 (H) 09/06/2023       Assessment & Plan:  Diabetes mellitus type 2 in obese Assessment & Plan: hgba1c acceptable, minimize simple carbs. Increase exercise as tolerated. Continue current meds    Essential hypertension Assessment & Plan: Well controlled, no changes to meds. Encouraged heart healthy diet such as the DASH diet and exercise as tolerated.     Chronic gout without tophus, unspecified cause, unspecified site Assessment & Plan: Hydrate and monitor    Hyperlipidemia, mixed Assessment & Plan: Tolerating statin, encouraged heart healthy diet, avoid trans fats, minimize simple carbs and saturated fats. Increase exercise as tolerated   MUSCLE CRAMPS Assessment & Plan: Hydrate and monitor    Insomnia, unspecified type Assessment & Plan: Encouraged good sleep hygiene such as dark, quiet  room. No blue/green glowing lights such as computer screens in bedroom. No alcohol or stimulants in evening. Cut down on caffeine as able. Regular exercise is helpful but not just prior to bed time.      Assessment and Plan Assessment & Plan Insulin Resistance Elevated A1C, possibly due to recent increased sugar intake. Discussed the impact of diet and lifestyle on blood glucose control. -Encouraged to reduce sugar intake and monitor symptoms of hyperglycemia. -Repeat labs in June 2025 to reassess A1C.  Polypharmacy Currently taking 20 pills daily, including iron supplement. Expressed desire to reduce pill burden. -Reduced  iron supplement intake due to resolution of anemia. -Consider further medication review to identify potential for deprescribing.  Knee Pain Chronic left knee pain, variable in intensity but tolerable. -Continue current management strategy.  Foot Pain Chronic foot pain, described as a sensation of being stepped on. -Continue current management strategy.  Urinary Frequency Increased urinary frequency, possibly related to age-related changes in bladder control. -Continue current management strategy.  General Health Maintenance -Continue regular platelet donations as tolerated. -Encouraged to maintain hydration, particularly on donation days. -Encouraged to consider use of audiobooks due to eye fatigue from screen time. -Follow-up appointment scheduled for approximately four months from now.     Danise Edge, MD

## 2023-09-10 ENCOUNTER — Encounter: Payer: Self-pay | Admitting: Family Medicine

## 2023-09-12 NOTE — Telephone Encounter (Signed)
 Pt called back and advised that he wishes to defer starting Jardiance

## 2023-09-13 ENCOUNTER — Ambulatory Visit (INDEPENDENT_AMBULATORY_CARE_PROVIDER_SITE_OTHER): Payer: Medicare Other | Admitting: Podiatry

## 2023-09-13 ENCOUNTER — Encounter: Payer: Self-pay | Admitting: Podiatry

## 2023-09-13 ENCOUNTER — Encounter: Payer: Self-pay | Admitting: Emergency Medicine

## 2023-09-13 VITALS — Ht 71.0 in | Wt 207.0 lb

## 2023-09-13 DIAGNOSIS — L84 Corns and callosities: Secondary | ICD-10-CM | POA: Diagnosis not present

## 2023-09-13 DIAGNOSIS — E1142 Type 2 diabetes mellitus with diabetic polyneuropathy: Secondary | ICD-10-CM

## 2023-09-13 DIAGNOSIS — M79674 Pain in right toe(s): Secondary | ICD-10-CM

## 2023-09-13 DIAGNOSIS — B351 Tinea unguium: Secondary | ICD-10-CM | POA: Diagnosis not present

## 2023-09-13 DIAGNOSIS — M79675 Pain in left toe(s): Secondary | ICD-10-CM

## 2023-09-17 NOTE — Progress Notes (Signed)
  Subjective:  Patient ID: Evan Best, male    DOB: 08-04-1950,  MRN: [id number]  [name] presents to clinic today for at risk foot care with history of diabetic neuropathy and callus(es) right great toe and painful thick toenails that are difficult to trim. Painful toenails interfere with ambulation. Aggravating factors include wearing enclosed shoe gear. Pain is relieved with periodic professional debridement. Painful calluses are aggravated when weightbearing with and without shoegear. Pain is relieved with periodic professional debridement.  Chief Complaint  Patient presents with   Nail Problem    Pt is here for [facility] last A1C was 7.4 PCP is Dr Abner Greenspan and LOV was last week.   New problem(s): None.   PCP is Bradd Canary, MD.  No Known Allergies  Review of Systems: Negative except as noted in the HPI.  Objective: No changes noted in today's physical examination. There were no vitals filed for this visit. Evan Best is a pleasant 73 y.o. male WD, WN in NAD. AAO x 3.  Vascular Examination: Vascular status intact b/l with palpable pedal pulses. CFT immediate b/l. Pedal hair present. No edema. No pain with calf compression b/l. Skin temperature gradient WNL b/l. No varicosities noted. No cyanosis or clubbing noted.  Neurological Examination: Pt has subjective symptoms of neuropathy. Sensation grossly intact b/l with 10 gram monofilament. Vibratory sensation intact b/l.  Dermatological Examination: Pedal skin with normal turgor, texture and tone b/l. No open wounds nor interdigital macerations noted. Toenails 1-5 b/l thick, discolored, elongated with subungual debris and pain on dorsal palpation.   Hyperkeratotic lesion(s) R hallux.  No erythema, no edema, no drainage, no fluctuance.  Musculoskeletal Examination: Muscle strength 5/5 to b/l LE.  No pain, crepitus noted b/l. No gross pedal deformities. Patient ambulates independently without assistive aids.    Radiographs: None  Assessment/Plan: 1. Pain due to onychomycosis of toenails of both feet   2. Callus   3. Diabetic peripheral neuropathy associated with type 2 diabetes mellitus (HCC)     Patient was evaluated and treated. All patient's and/or POA's questions/concerns addressed on today's visit. Toenails 1-5 debrided in length and girth without incident. Callus(es) right great toe pared with sharp debridement without incident. Continue daily foot inspections and monitor blood glucose per PCP/Endocrinologist's recommendations. Continue soft, supportive shoe gear daily. Report any pedal injuries to medical professional. Call office if there are any questions/concerns. -Patient/POA to call should there be question/concern in the interim.   Return in about 3 months (around 12/14/2023).  [name], DPM      [facility] LOCATION: [address], [place] [address]                   Office [phone]   [facility] LOCATION: [address] [place], [place] [address] Office [phone]

## 2023-09-24 ENCOUNTER — Other Ambulatory Visit: Payer: Self-pay | Admitting: Family Medicine

## 2023-10-15 NOTE — Progress Notes (Signed)
 HPI male never smoker for sleep evaluation.  Previously followed for OSA, restless legs, chronic rhinitis, complicated by HBP, DM 2, GERD, HBP, gout, NPSG 11/26/06-AHI 56/hour, desaturation to 86%, body weight 225 pounds  ----------------------------------------------------------------------------------------   10/13/22- 73 year old male former light smoker followed for OSA, restless legs, chronic rhinitis, complicated by HBP, DM 2, GERD, gout, chronic Headaches, Hyperlipidemia,  CPAP auto 5-15/Adapt   AirSense 10 AutoSet Download compliance 100%, AHI 0.4/ hr Body weight today-200 lbs Covid vax-2 Phizer Flu vax- Download reviewed.  He is satisfied to continue CPAP without change, recognizing benefit.  Leg aches at night interfering some with sleep quality.  He takes occasional afternoon nap. Seasonal pollen rhinitis has not been significant so far this year.  10/16/23- 73 year old male former light smoker followed for OSA, restless legs, chronic rhinitis, complicated by HBP, DM 2, GERD, gout, chronic Headaches, Hyperlipidemia,  CPAP auto 5-15/Adapt   AirSense 10 AutoSet Download compliance 97%, AHI 0.3/hr Body weight today- -----Patient sleeps on side.  Hard to keep CPAP mask on face.  Frequently choking on food Discussed the use of AI scribe software for clinical note transcription with the patient, who gave verbal consent to proceed.  History of Present Illness   The patient, with a history of sleep apnea and dysphagia, presents for a follow-up visit. He reports that he sleeps on his side, which tends to loosen his CPAP mask. Despite this, he feels that life is livable and his machine is set to range between five and fifteen centimeters of water pressure. The machine spends most of its time between six and ten centimeters, and he is experiencing less than one breakthrough apnea an hour.  In addition to his sleep apnea, the patient has been experiencing trouble swallowing, which he describes as  a need to belch due to gas pressure build-up. When this occurs, he has to run to the bathroom to spit up large amounts of phlegm. He also reports that food sometimes gets stuck and won't go down, causing him to cough it back up. This issue comes and goes, with several days in a row of it happening, followed by a couple of months without any issues.  The patient also mentions that he has been experiencing some roughness in his breathing, which he attributes to a recent illness that he believes was the flu. He reports that he went 48 hours without eating during this illness and that his breathing has felt a little rough since then. He indicated some depression ater loss of wife.    Assessment and Plan:    Obstructive Sleep Apnea CPAP effectively controls apneas with settings between 5-15 cm H2O. Mask loosening occurs when side sleeping but is manageable. - Continue current CPAP settings.  Dysphagia with possible aspiration Intermittent choking and coughing suggest aspiration, possibly due to reflux. Modified barium swallow needed to assess swallowing mechanics. - Order chest x-ray to assess for chronic bronchitis or aspiration-related changes. - Order modified barium swallow with speech therapist to evaluate swallowing mechanics and potential aspiration. -He needs to discuss GI eval with PCP  Chronic bronchitis Coarse breath sounds suggest chronic bronchitis, possibly from aspiration. Recent symptoms may be viral-related. - Order chest x-ray to evaluate lung condition.  Acid reflux Medication controls reflux, but occasional heartburn persists, possibly contributing to aspiration risk. - Continue current acid reflux medication.      ROS-see HPI   + = positive Constitutional:   No-   weight loss, night sweats, fevers, chills, fatigue, lassitude. HEENT:  No-  headaches, +difficulty swallowing, tooth/dental problems, sore throat,       No-  sneezing, itching, ear ache, nasal congestion, post  nasal drip,  CV:  No-   chest pain, orthopnea, PND, swelling in lower extremities, anasarca, dizziness, palpitations Resp: No-   shortness of breath with exertion or at rest.              No-   productive cough,   non-productive cough,  No- coughing up of blood.              No-   change in color of mucus.  No- wheezing.   Skin: No-   rash or lesions. GI:+ heartburn, indigestion, abdominal pain, nausea, vomiting,  GU: . MS:  No-   joint pain or swelling.   Neuro-     nothing unusual Psych:  No- change in mood or affect. No depression or anxiety.  No memory loss.  OBJ General- Alert, Oriented, Affect-appropriate, Distress- none acute,  Skin- rash-none, lesions- none, excoriation- none Lymphadenopathy- none Head- atraumatic            Eyes- Gross vision intact, PERRLA, conjunctivae clear secretions            Ears- Hearing, canals-normal            Nose- Clear, +narrower on right, +mucus bridging, polyps, erosion, perforation             Throat- Mallampati III , mucosa  , drainage- none, tonsils- atrophic Neck- flexible , trachea midline, no stridor , thyroid  nl, carotid no bruit Chest - symmetrical excursion , unlabored           Heart/CV- RRR, no murmur , no gallop  , no rub, nl s1 s2                           - JVD- none , edema- none, stasis changes- none, varices- none           Lung- clear to P&A, wheeze- none, cough- none , dullness-none, rub- none           Chest wall-  Abd-  Br/ Gen/ Rectal- Not done, not indicated Extrem- cyanosis- none, clubbing, none, atrophy- none, strength- nl Neuro- grossly intact to observation

## 2023-10-16 ENCOUNTER — Ambulatory Visit (INDEPENDENT_AMBULATORY_CARE_PROVIDER_SITE_OTHER)

## 2023-10-16 ENCOUNTER — Ambulatory Visit: Payer: Medicare Other | Admitting: Internal Medicine

## 2023-10-16 ENCOUNTER — Encounter: Payer: Self-pay | Admitting: Internal Medicine

## 2023-10-16 VITALS — BP 130/70 | HR 84 | Temp 97.5°F | Ht 71.0 in | Wt 205.0 lb

## 2023-10-16 DIAGNOSIS — G4733 Obstructive sleep apnea (adult) (pediatric): Secondary | ICD-10-CM

## 2023-10-16 DIAGNOSIS — J42 Unspecified chronic bronchitis: Secondary | ICD-10-CM

## 2023-10-16 DIAGNOSIS — T17918A Gastric contents in respiratory tract, part unspecified causing other injury, initial encounter: Secondary | ICD-10-CM

## 2023-10-16 DIAGNOSIS — R1310 Dysphagia, unspecified: Secondary | ICD-10-CM

## 2023-10-16 DIAGNOSIS — Z87891 Personal history of nicotine dependence: Secondary | ICD-10-CM | POA: Diagnosis not present

## 2023-10-16 NOTE — Patient Instructions (Signed)
 Order- CXR   dx chronic bronchitis  Order- schedule modified barium swallow             Refer to speech therapy  assess choking on food

## 2023-10-17 ENCOUNTER — Other Ambulatory Visit (HOSPITAL_COMMUNITY): Payer: Self-pay | Admitting: *Deleted

## 2023-10-17 ENCOUNTER — Telehealth (HOSPITAL_COMMUNITY): Payer: Self-pay | Admitting: *Deleted

## 2023-10-17 DIAGNOSIS — R059 Cough, unspecified: Secondary | ICD-10-CM

## 2023-10-17 DIAGNOSIS — R1310 Dysphagia, unspecified: Secondary | ICD-10-CM

## 2023-10-17 NOTE — Telephone Encounter (Signed)
 Attempted to contact patient to schedule OP MBS. Left VM @ [phone]. RKEEL

## 2023-10-19 ENCOUNTER — Telehealth: Payer: Self-pay

## 2023-10-19 NOTE — Telephone Encounter (Signed)
-----   Message from Hardy sent at 10/18/2023  2:40 PM EDT ----- CXR- looks stable without any acute process

## 2023-10-24 ENCOUNTER — Telehealth: Payer: Self-pay | Admitting: Internal Medicine

## 2023-10-24 NOTE — Telephone Encounter (Signed)
 Copied from CRM [id number]. Topic: Clinical - Lab/Test Results >> Oct 24, 2023  3:11 PM Juliana Ocean wrote: Reason for CRM: pt returning call from earlier today

## 2023-10-27 ENCOUNTER — Encounter: Payer: Self-pay | Admitting: Internal Medicine

## 2023-10-31 ENCOUNTER — Ambulatory Visit (HOSPITAL_COMMUNITY)
Admission: RE | Admit: 2023-10-31 | Discharge: 2023-10-31 | Disposition: A | Discharge status: Home / Self Care | Source: Ambulatory Visit | Attending: Family Medicine | Admitting: Family Medicine

## 2023-10-31 ENCOUNTER — Other Ambulatory Visit (HOSPITAL_COMMUNITY): Payer: Self-pay | Admitting: Internal Medicine

## 2023-10-31 DIAGNOSIS — R1310 Dysphagia, unspecified: Secondary | ICD-10-CM | POA: Diagnosis not present

## 2023-10-31 DIAGNOSIS — R059 Cough, unspecified: Secondary | ICD-10-CM

## 2023-10-31 DIAGNOSIS — T17918A Gastric contents in respiratory tract, part unspecified causing other injury, initial encounter: Secondary | ICD-10-CM

## 2023-10-31 NOTE — Progress Notes (Signed)
 Modified Barium Swallow Study  Patient Details  Name: Evan Best MRN: [id number] Date of Birth: July 04, 1951  Today's Date: 10/31/2023  HPI/PMH: HPI: Evan Best is a 73 yo M with PMHx significant for: OSA, restless legs, chronic rhinitis, HBP, DM2, GERD, gout, chronic headaches, and HLD. During most recent PCP visit 10/16/23, patient endorses " trouble swallowing, which he describes as a need to belch due to gas pressure build-up. When this occurs, he has to run to the bathroom to spit up large amounts of phlegm. He also reports that food sometimes gets stuck and won't go down, causing him to cough it back up. This issue comes and goes, with several days in a row of it happening, followed by a couple of months without any issues."   Clinical Impression: Clinical Impression: Patient presents with oropharyngeal swallowing function that is [facility] across all consistencies tested. Patient reports globus sensation in the chest intermittently, followed by gas and need to cough up large amounts of phlegm. SLP administered thin liquids, NTL, HTL, puree, and regular solids with no evidence of airway invasion. Additionally, no significant residue obsreved. Of note, patient does demonstrate suspected CP bar which contributes to small collection of residue at this level with purees, though largely negligible. Patient also exhibits trace amount of residue at the valleculae due to minimally diminished pharyngeal stripping wave, which may be related to pressure deficits from farther down in the swallowing system. Brief, non-diagnosted esophageal sweep performed which revealed bolus stasis with puree, though this eventually cleared. Recommend continuation of preferred diet. Patient may benefit from GI consult should swallowing symptoms persist.  Factors that may increase risk of adverse event in presence of aspiration Roderick Civatte & Jessy Morocco 2021): No data recorded  Recommendations/Plan: Swallowing Evaluation  Recommendations Swallowing Evaluation Recommendations Recommendations: PO diet PO Diet Recommendation: Regular; Thin liquids (Level 0) Liquid Administration via: Straw; Cup Medication Administration: Whole meds with liquid Supervision: Patient able to self-feed Recommended consults: Consider GI consultation    Treatment Plan Treatment Plan Treatment recommendations: No treatment recommended at this time Follow-up recommendations: No SLP follow up     Recommendations Recommendations for follow up therapy are one component of a multi-disciplinary discharge planning process, led by the attending physician.  Recommendations may be updated based on patient status, additional functional criteria and insurance authorization.  Assessment: Orofacial Exam: Orofacial Exam Oral Cavity: Oral Hygiene: WFL Oral Cavity - Dentition: Adequate natural dentition Orofacial Anatomy: WFL Oral Motor/Sensory Function: WFL    Anatomy:  Anatomy: Prominent cricopharyngeus   Boluses Administered: Boluses Administered Boluses Administered: Thin liquids (Level 0); Mildly thick liquids (Level 2, nectar thick); Moderately thick liquids (Level 3, honey thick); Puree; Solid     Oral Impairment Domain: Oral Impairment Domain Lip Closure: No labial escape Tongue control during bolus hold: Cohesive bolus between tongue to palatal seal Bolus preparation/mastication: Timely and efficient chewing and mashing Bolus transport/lingual motion: Brisk tongue motion Oral residue: Trace residue lining oral structures Location of oral residue : Tongue; Palate Initiation of pharyngeal swallow : Valleculae     Pharyngeal Impairment Domain: Pharyngeal Impairment Domain Soft palate elevation: No bolus between soft palate (SP)/pharyngeal wall (PW) Laryngeal elevation: Complete superior movement of thyroid  cartilage with complete approximation of arytenoids to epiglottic petiole Anterior hyoid excursion: Complete  anterior movement Epiglottic movement: Complete inversion Laryngeal vestibule closure: Complete, no air/contrast in laryngeal vestibule Pharyngeal stripping wave : Present - diminished Pharyngeal contraction (A/P view only): N/A Pharyngoesophageal segment opening: Complete distension and complete duration, no obstruction  of flow Tongue base retraction: Trace column of contrast or air between tongue base and PPW Pharyngeal residue: Trace residue within or on pharyngeal structures Location of pharyngeal residue: Valleculae (At the level of the CP bar)     Esophageal Impairment Domain: Esophageal Impairment Domain Esophageal clearance upright position: Esophageal retention    Pill: No data recorded  Penetration/Aspiration Scale Score: Penetration/Aspiration Scale Score 1.  Material does not enter airway: Thin liquids (Level 0); Mildly thick liquids (Level 2, nectar thick); Moderately thick liquids (Level 3, honey thick); Solid; Puree    Compensatory Strategies: Compensatory Strategies Compensatory strategies: No       General Information: Caregiver present: No   Diet Prior to this Study: Regular    Temperature : Normal    Respiratory Status: WFL    Supplemental O2: None (Room air)    History of Recent Intubation: No   Behavior/Cognition: Alert; Cooperative; Pleasant mood  Self-Feeding Abilities: Able to self-feed  Baseline vocal quality/speech: Normal  Volitional Cough: Able to elicit  Volitional Swallow: Able to elicit  Exam Limitations: No limitations   Goal Planning: No data recorded No data recorded No data recorded No data recorded Consulted and agree with results and recommendations: Patient   Pain: Pain Assessment Pain Assessment: No/denies pain    End of Session: Start Time:SLP Start Time (ACUTE ONLY): 1145  Stop Time: SLP Stop Time (ACUTE ONLY): 1159  Time Calculation:SLP Time Calculation (min) (ACUTE ONLY): 14 min  Charges: SLP  Evaluations $ SLP Speech Visit: 1 Visit  SLP Evaluations $Outpatient MBS Swallow: 1 Procedure   SLP visit diagnosis: SLP Visit Diagnosis: Dysphagia, unspecified (R13.10)    Past Medical History:  Past Medical History:  Diagnosis Date   Allergic state 09/22/2013   Anemia 09/07/2015   Arthritis 03/06/2015   Diabetes mellitus type II 1/09   Dysphagia 05/04/2017   Esophageal reflux 03/06/2015   Gout 09/18/2012   Left big toe   Hypertension    Insomnia 09/13/2015   Migraine headache    remote history   OSA (obstructive sleep apnea)    Preventative health care 09/05/2014   Right shoulder pain 02/19/2009   Qualifier: Diagnosis of  By: Marthe Slain    RLS (restless legs syndrome) 09/07/2015   Sleep apnea    uses cpap   Tremor 09/07/2015   Past Surgical History:  Past Surgical History:  Procedure Laterality Date   HERNIA REPAIR  1998   umbilical hernia repair   KNEE SURGERY     left knee surgery   TONSILLECTOMY  1959   VASECTOMY     Dorla Gartner, M.A., CCC-SLP  Feliberto Hopping 10/31/2023, 2:54 PM

## 2023-11-03 ENCOUNTER — Other Ambulatory Visit: Payer: Self-pay | Admitting: Family Medicine

## 2023-12-02 ENCOUNTER — Other Ambulatory Visit: Payer: Self-pay | Admitting: Family Medicine

## 2023-12-18 NOTE — Progress Notes (Unsigned)
 HPI male never smoker for sleep evaluation.  Previously followed for OSA, restless legs, chronic rhinitis, complicated by HBP, DM 2, GERD, HBP, gout, NPSG 11/26/06-AHI 56/hour, desaturation to 86%, body weight 225 pounds  ----------------------------------------------------------------------------------------   10/16/23- 73 year old male former light smoker followed for OSA, restless legs, chronic rhinitis, complicated by HBP, DM 2, GERD, gout, chronic Headaches, Hyperlipidemia,  CPAP auto 5-15/Adapt   AirSense 10 AutoSet Download compliance 97%, AHI 0.3/hr Body weight today- -----Patient sleeps on side.  Hard to keep CPAP mask on face.  Frequently choking on food Discussed the use of AI scribe software for clinical note transcription with the patient, who gave verbal consent to proceed.  History of Present Illness   The patient, with a history of sleep apnea and dysphagia, presents for a follow-up visit. He reports that he sleeps on his side, which tends to loosen his CPAP mask. Despite this, he feels that life is livable and his machine is set to range between five and fifteen centimeters of water pressure. The machine spends most of its time between six and ten centimeters, and he is experiencing less than one breakthrough apnea an hour.  In addition to his sleep apnea, the patient has been experiencing trouble swallowing, which he describes as a need to belch due to gas pressure build-up. When this occurs, he has to run to the bathroom to spit up large amounts of phlegm. He also reports that food sometimes gets stuck and won't go down, causing him to cough it back up. This issue comes and goes, with several days in a row of it happening, followed by a couple of months without any issues.  The patient also mentions that he has been experiencing some roughness in his breathing, which he attributes to a recent illness that he believes was the flu. He reports that he went 48 hours without eating  during this illness and that his breathing has felt a little rough since then. He indicated some depression ater loss of wife.    Assessment and Plan:    Obstructive Sleep Apnea CPAP effectively controls apneas with settings between 5-15 cm H2O. Mask loosening occurs when side sleeping but is manageable. - Continue current CPAP settings.  Dysphagia with possible aspiration Intermittent choking and coughing suggest aspiration, possibly due to reflux. Modified barium swallow needed to assess swallowing mechanics. - Order chest x-ray to assess for chronic bronchitis or aspiration-related changes. - Order modified barium swallow with speech therapist to evaluate swallowing mechanics and potential aspiration. -He needs to discuss GI eval with PCP  Chronic bronchitis Coarse breath sounds suggest chronic bronchitis, possibly from aspiration. Recent symptoms may be viral-related. - Order chest x-ray to evaluate lung condition.  Acid reflux Medication controls reflux, but occasional heartburn persists, possibly contributing to aspiration risk. - Continue current acid reflux medication.   12/19/23- 73 year old male former light smoker followed for OSA, restless legs, chronic rhinitis, complicated by HBP, DM 2, GERD, gout, chronic Headaches, Hyperlipidemia,  CPAP auto 5-15/Adapt   AirSense 10 AutoSet Download compliance 93%, AHI 0.3/hr Body weight today- Seen in April with concerns of dysphagia/ reflux, possible aspiration, bronchitis.  MBS/SLP 10/31/23- no significant abnormality. Recommend GI consult if symptoms persist.  CXR 10/16/23-  No active cardiopulmonary disease. -----OSA / Bronchitis - Pt also states he has been aspirating food when he eats Discussed the use of AI scribe software for clinical note transcription with the patient, who gave verbal consent to proceed.  History of Present Illness   Evan  E Best is a 73 year old male who presents with CPAP use and swallowing  difficulties.  He uses his CPAP machine consistently with a good seal, experiencing occasional leakage, especially when sleeping on his side. He averages less than one breakthrough apnea per hour over the past month. There is an inverse relationship between leakage and apnea frequency.  He experienced a sensation of aspirating food while eating, with only one incident since the last interaction. A  swallowing evaluation by a speech therapist was performed, and a GI consult was recommended if symptoms persist. He has not pursued further evaluation as the incidents are infrequent. I have asked him to contact us  or his [facility] if he has more of these incidents.    Assessment and Plan:    Obstructive Sleep Apnea Obstructive sleep apnea well-managed with CPAP. AHI <1/hr indicates effective management. Occasional mask leakage noted. - Continue CPAP therapy.  Aspiration Risk Intermittent aspiration sensation with one incident since last visit. Previous normal swallowing evaluation. Prefers monitoring due to infrequency. - Monitor for increased frequency or severity of aspiration incidents. - Consult gastroenterology if incidents become more frequent or concerning.     ROS-see HPI   + = positive Constitutional:   No-   weight loss, night sweats, fevers, chills, fatigue, lassitude. HEENT:   No-  headaches, +difficulty swallowing, tooth/dental problems, sore throat,       No-  sneezing, itching, ear ache, nasal congestion, post nasal drip,  CV:  No-   chest pain, orthopnea, PND, swelling in lower extremities, anasarca, dizziness, palpitations Resp: No-   shortness of breath with exertion or at rest.              No-   productive cough,   non-productive cough,  No- coughing up of blood.              No-   change in color of mucus.  No- wheezing.   Skin: No-   rash or lesions. GI:+ heartburn, indigestion, abdominal pain, nausea, vomiting,  GU: . MS:  No-   joint pain or swelling.   Neuro-     nothing  unusual Psych:  No- change in mood or affect. No depression or anxiety.  No memory loss.  OBJ General- Alert, Oriented, Affect-appropriate, Distress- none acute,  Skin- rash-none, lesions- none, excoriation- none Lymphadenopathy- none Head- atraumatic            Eyes- Gross vision intact, PERRLA, conjunctivae clear secretions            Ears- Hearing, canals-normal            Nose- Clear, +narrower on right, +mucus bridging, polyps, erosion, perforation             Throat- Mallampati III , mucosa  , drainage- none, tonsils- atrophic Neck- flexible , trachea midline, no stridor , thyroid  nl, carotid no bruit Chest - symmetrical excursion , unlabored           Heart/CV- RRR, no murmur , no gallop  , no rub, nl s1 s2                           - JVD- none , edema- none, stasis changes- none, varices- none           Lung- clear to P&A, wheeze- none, cough- none , dullness-none, rub- none           Chest wall-  Abd-  Br/ Gen/ Rectal- Not done, not indicated Extrem- cyanosis- none, clubbing, none, atrophy- none, strength- nl Neuro- grossly intact to observation

## 2023-12-19 ENCOUNTER — Encounter: Payer: Self-pay | Admitting: Internal Medicine

## 2023-12-19 ENCOUNTER — Ambulatory Visit (INDEPENDENT_AMBULATORY_CARE_PROVIDER_SITE_OTHER): Admitting: Internal Medicine

## 2023-12-19 VITALS — BP 148/77 | HR 76 | Ht 71.0 in | Wt 208.2 lb

## 2023-12-19 DIAGNOSIS — K219 Gastro-esophageal reflux disease without esophagitis: Secondary | ICD-10-CM | POA: Diagnosis not present

## 2023-12-19 DIAGNOSIS — G4733 Obstructive sleep apnea (adult) (pediatric): Secondary | ICD-10-CM | POA: Diagnosis not present

## 2023-12-19 DIAGNOSIS — J42 Unspecified chronic bronchitis: Secondary | ICD-10-CM

## 2023-12-19 DIAGNOSIS — R1310 Dysphagia, unspecified: Secondary | ICD-10-CM | POA: Diagnosis not present

## 2023-12-19 NOTE — Patient Instructions (Signed)
 We can continue CPAP auto 5-15  If you find you are having more trouble choking on food then we or your primary care office can refer you to a gastroenterologist for help.

## 2023-12-28 ENCOUNTER — Other Ambulatory Visit: Payer: Self-pay | Admitting: Family Medicine

## 2023-12-28 DIAGNOSIS — E119 Type 2 diabetes mellitus without complications: Secondary | ICD-10-CM | POA: Diagnosis not present

## 2023-12-28 DIAGNOSIS — H04123 Dry eye syndrome of bilateral lacrimal glands: Secondary | ICD-10-CM | POA: Diagnosis not present

## 2023-12-28 DIAGNOSIS — H353131 Nonexudative age-related macular degeneration, bilateral, early dry stage: Secondary | ICD-10-CM | POA: Diagnosis not present

## 2023-12-28 DIAGNOSIS — H2513 Age-related nuclear cataract, bilateral: Secondary | ICD-10-CM | POA: Diagnosis not present

## 2023-12-28 DIAGNOSIS — H33301 Unspecified retinal break, right eye: Secondary | ICD-10-CM | POA: Diagnosis not present

## 2023-12-28 DIAGNOSIS — H43393 Other vitreous opacities, bilateral: Secondary | ICD-10-CM | POA: Diagnosis not present

## 2023-12-28 LAB — HM DIABETES EYE EXAM

## 2024-01-01 ENCOUNTER — Encounter (INDEPENDENT_AMBULATORY_CARE_PROVIDER_SITE_OTHER): Admitting: Ophthalmology

## 2024-01-01 DIAGNOSIS — H35033 Hypertensive retinopathy, bilateral: Secondary | ICD-10-CM

## 2024-01-01 DIAGNOSIS — I10 Essential (primary) hypertension: Secondary | ICD-10-CM | POA: Diagnosis not present

## 2024-01-01 DIAGNOSIS — H43813 Vitreous degeneration, bilateral: Secondary | ICD-10-CM

## 2024-01-01 DIAGNOSIS — H33301 Unspecified retinal break, right eye: Secondary | ICD-10-CM | POA: Diagnosis not present

## 2024-01-01 DIAGNOSIS — H2513 Age-related nuclear cataract, bilateral: Secondary | ICD-10-CM | POA: Diagnosis not present

## 2024-01-03 ENCOUNTER — Encounter: Payer: Self-pay | Admitting: Podiatry

## 2024-01-03 ENCOUNTER — Ambulatory Visit (INDEPENDENT_AMBULATORY_CARE_PROVIDER_SITE_OTHER): Admitting: Podiatry

## 2024-01-03 DIAGNOSIS — L84 Corns and callosities: Secondary | ICD-10-CM

## 2024-01-03 DIAGNOSIS — M79675 Pain in left toe(s): Secondary | ICD-10-CM | POA: Diagnosis not present

## 2024-01-03 DIAGNOSIS — M79674 Pain in right toe(s): Secondary | ICD-10-CM

## 2024-01-03 DIAGNOSIS — E1142 Type 2 diabetes mellitus with diabetic polyneuropathy: Secondary | ICD-10-CM

## 2024-01-03 DIAGNOSIS — B351 Tinea unguium: Secondary | ICD-10-CM | POA: Diagnosis not present

## 2024-01-07 NOTE — Progress Notes (Signed)
  Subjective:  Patient ID: Evan Best, male    DOB: 04-18-1951,  MRN: [id number]  Evan Best presents to clinic today for for annual diabetic foot examination and callus(es) right foot and painful thick toenails that are difficult to trim. Painful toenails interfere with ambulation. Aggravating factors include wearing enclosed shoe gear. Pain is relieved with periodic professional debridement. Painful calluses are aggravated when weightbearing with and without shoegear. Pain is relieved with periodic professional debridement.   New problem(s): None.   PCP is Domenica Harlene LABOR, MD.  No Known Allergies  Review of Systems: Negative except as noted in the HPI.  Objective: No changes noted in today's physical examination. There were no vitals filed for this visit. Evan Best is a pleasant 73 y.o. male WD, WN in NAD. AAO x 3.  Vascular Examination: Capillary refill time immediate b/l. Palpable pedal pulses. Pedal hair present b/l. No pain with calf compression b/l. Skin temperature gradient WNL b/l. No cyanosis or clubbing b/l. No ischemia or gangrene noted b/l. No varicosities noted.  Neurological Examination: Sensation grossly intact b/l with 10 gram monofilament. Vibratory sensation intact b/l. Pt has subjective symptoms of neuropathy.  Dermatological Examination: Pedal skin with normal turgor, texture and tone b/l.  No open wounds. No interdigital macerations.   Toenails 1-5 b/l thick, discolored, elongated with subungual debris and pain on dorsal palpation.   Hyperkeratotic lesion(s) medial IPJ of right great toe.  No erythema, no edema, no drainage, no fluctuance.  Musculoskeletal Examination: Normal muscle strength 5/5 to all lower extremity muscle groups bilaterally. No pain, crepitus or joint limitation noted with ROM b/l LE. No gross bony pedal deformities b/l. Patient ambulates independently without assistive aids.  Radiographs: None  Last A1c:      Latest Ref  Rng & Units 09/06/2023    1:37 PM 05/23/2023    1:36 PM 01/26/2023   10:41 AM  Hemoglobin A1C  Hemoglobin-A1c 4.6 - 6.5 % 7.7  6.1  6.9    Assessment/Plan: 1. Pain due to onychomycosis of toenails of both feet   2. Callus   3. Diabetic peripheral neuropathy associated with type 2 diabetes mellitus (HCC)     Diabetic foot examination performed. All patient's and/or POA's questions/concerns addressed on today's visit. Mycotic toenails 1-5 debrided in length and girth without incident. Callus(es) medial IPJ of right great toe pared with sharp debridement without incident. Continue daily foot inspections and monitor blood glucose per PCP/Endocrinologist's recommendations. Continue soft, supportive shoe gear daily. Report any pedal injuries to medical professional. Call office if there are any questions/concerns. -Patient/POA to call should there be question/concern in the interim.   Return in about 3 months (around 04/04/2024).  Evan Best, DPM      [place] LOCATION: [address] [place] [address]                   Office [phone]   [facility] LOCATION: [address] [place], [place] [address] Office [phone]

## 2024-01-15 ENCOUNTER — Encounter (INDEPENDENT_AMBULATORY_CARE_PROVIDER_SITE_OTHER): Admitting: Ophthalmology

## 2024-01-15 DIAGNOSIS — H33301 Unspecified retinal break, right eye: Secondary | ICD-10-CM

## 2024-01-15 DIAGNOSIS — I10 Essential (primary) hypertension: Secondary | ICD-10-CM | POA: Diagnosis not present

## 2024-01-15 DIAGNOSIS — H35033 Hypertensive retinopathy, bilateral: Secondary | ICD-10-CM | POA: Diagnosis not present

## 2024-01-15 DIAGNOSIS — H43813 Vitreous degeneration, bilateral: Secondary | ICD-10-CM

## 2024-02-01 ENCOUNTER — Other Ambulatory Visit: Payer: Self-pay | Admitting: Family Medicine

## 2024-02-01 DIAGNOSIS — E611 Iron deficiency: Secondary | ICD-10-CM

## 2024-02-19 NOTE — Assessment & Plan Note (Signed)
 Well controlled, no changes to meds. Encouraged heart healthy diet such as the DASH diet and exercise as tolerated.

## 2024-02-19 NOTE — Assessment & Plan Note (Signed)
 Hydrate and monitor

## 2024-02-19 NOTE — Assessment & Plan Note (Signed)
 [id number] acceptable, minimize simple carbs. Increase exercise as tolerated. Continue current meds

## 2024-02-19 NOTE — Assessment & Plan Note (Signed)
 Tolerating statin, encouraged heart healthy diet, avoid trans fats, minimize simple carbs and saturated fats. Increase exercise as tolerated

## 2024-02-20 ENCOUNTER — Telehealth: Payer: Self-pay

## 2024-02-20 NOTE — Telephone Encounter (Signed)
 Returned patients call and no answer. Left a vm to return call to schedule alb appointment if he would like labs before his appointment or he could want until his appointment.  Copied from CRM [id number]. Topic: Appointments - Appointment Info/Confirmation >> Feb 19, 2024  4:36 PM Lavanda D wrote: Patient has an appointment on 8/14 and would like to confirm if he needs any lab work done prior to this visit, please call to [employer] email @ [email or web address]

## 2024-02-22 ENCOUNTER — Ambulatory Visit (INDEPENDENT_AMBULATORY_CARE_PROVIDER_SITE_OTHER): Payer: Medicare Other | Admitting: Family Medicine

## 2024-02-22 ENCOUNTER — Encounter: Payer: Self-pay | Admitting: Family Medicine

## 2024-02-22 VITALS — BP 132/84 | HR 70 | Resp 16 | Ht 71.0 in | Wt 208.4 lb

## 2024-02-22 DIAGNOSIS — M1A9XX0 Chronic gout, unspecified, without tophus (tophi): Secondary | ICD-10-CM | POA: Diagnosis not present

## 2024-02-22 DIAGNOSIS — E1169 Type 2 diabetes mellitus with other specified complication: Secondary | ICD-10-CM | POA: Diagnosis not present

## 2024-02-22 DIAGNOSIS — I10 Essential (primary) hypertension: Secondary | ICD-10-CM | POA: Diagnosis not present

## 2024-02-22 DIAGNOSIS — E669 Obesity, unspecified: Secondary | ICD-10-CM

## 2024-02-22 DIAGNOSIS — E782 Mixed hyperlipidemia: Secondary | ICD-10-CM

## 2024-02-22 DIAGNOSIS — Z1211 Encounter for screening for malignant neoplasm of colon: Secondary | ICD-10-CM

## 2024-02-22 DIAGNOSIS — R252 Cramp and spasm: Secondary | ICD-10-CM

## 2024-02-22 DIAGNOSIS — Z7984 Long term (current) use of oral hypoglycemic drugs: Secondary | ICD-10-CM

## 2024-02-22 DIAGNOSIS — M791 Myalgia, unspecified site: Secondary | ICD-10-CM | POA: Diagnosis not present

## 2024-02-22 NOTE — Patient Instructions (Addendum)
 Ice, elevate and consider Lidocaine or Voltaren gel  [place] recovery sandels   Hypertension, Adult High blood pressure (hypertension) is when the force of blood pumping through the arteries is too strong. The arteries are the blood vessels that carry blood from the heart throughout the body. Hypertension forces the heart to work harder to pump blood and may cause arteries to become narrow or stiff. Untreated or uncontrolled hypertension can lead to a heart attack, heart failure, a stroke, kidney disease, and other problems. A blood pressure reading consists of a higher number over a lower number. Ideally, your blood pressure should be below 120/80. The first (top) number is called the systolic pressure. It is a measure of the pressure in your arteries as your heart beats. The second (bottom) number is called the diastolic pressure. It is a measure of the pressure in your arteries as the heart relaxes. What are the causes? The exact cause of this condition is not known. There are some conditions that result in high blood pressure. What increases the risk? Certain factors may make you more likely to develop high blood pressure. Some of these risk factors are under your control, including: Smoking. Not getting enough exercise or physical activity. Being overweight. Having too much fat, sugar, calories, or salt (sodium) in your diet. Drinking too much alcohol. Other risk factors include: Having a personal history of heart disease, diabetes, high cholesterol, or kidney disease. Stress. Having a family history of high blood pressure and high cholesterol. Having obstructive sleep apnea. Age. The risk increases with age. What are the signs or symptoms? High blood pressure may not cause symptoms. Very high blood pressure (hypertensive crisis) may cause: Headache. Fast or irregular heartbeats (palpitations). Shortness of breath. Nosebleed. Nausea and vomiting. Vision changes. Severe chest  pain, dizziness, and seizures. How is this diagnosed? This condition is diagnosed by measuring your blood pressure while you are seated, with your arm resting on a flat surface, your legs uncrossed, and your feet flat on the floor. The cuff of the blood pressure monitor will be placed directly against the skin of your upper arm at the level of your heart. Blood pressure should be measured at least twice using the same arm. Certain conditions can cause a difference in blood pressure between your right and left arms. If you have a high blood pressure reading during one visit or you have normal blood pressure with other risk factors, you may be asked to: Return on a different day to have your blood pressure checked again. Monitor your blood pressure at home for 1 week or longer. If you are diagnosed with hypertension, you may have other blood or imaging tests to help your health care provider understand your overall risk for other conditions. How is this treated? This condition is treated by making healthy lifestyle changes, such as eating healthy foods, exercising more, and reducing your alcohol intake. You may be referred for counseling on a healthy diet and physical activity. Your health care provider may prescribe medicine if lifestyle changes are not enough to get your blood pressure under control and if: Your systolic blood pressure is above 130. Your diastolic blood pressure is above 80. Your personal target blood pressure may vary depending on your medical conditions, your age, and other factors. Follow these instructions at home: Eating and drinking  Eat a diet that is high in fiber and potassium, and low in sodium, added sugar, and fat. An example of this eating plan is called the DASH  diet. DASH stands for Dietary Approaches to Stop Hypertension. To eat this way: Eat plenty of fresh fruits and vegetables. Try to fill one half of your plate at each meal with fruits and vegetables. Eat whole  grains, such as whole-wheat pasta, brown rice, or whole-grain bread. Fill about one fourth of your plate with whole grains. Eat or drink low-fat dairy products, such as skim milk or low-fat yogurt. Avoid fatty cuts of meat, processed or cured meats, and poultry with skin. Fill about one fourth of your plate with lean proteins, such as fish, chicken without skin, beans, eggs, or tofu. Avoid pre-made and processed foods. These tend to be higher in sodium, added sugar, and fat. Reduce your daily sodium intake. Many people with hypertension should eat less than 1,500 mg of sodium a day. Do not drink alcohol if: Your health care provider tells you not to drink. You are pregnant, may be pregnant, or are planning to become pregnant. If you drink alcohol: Limit how much you have to: 0-1 drink a day for women. 0-2 drinks a day for men. Know how much alcohol is in your drink. In the [place] one drink equals one 12 oz bottle of beer (355 mL), one 5 oz glass of wine (148 mL), or one 1 oz glass of hard liquor (44 mL). Lifestyle  Work with your health care provider to maintain a healthy body weight or to lose weight. Ask what an ideal weight is for you. Get at least 30 minutes of exercise that causes your heart to beat faster (aerobic exercise) most days of the week. Activities may include walking, swimming, or biking. Include exercise to strengthen your muscles (resistance exercise), such as Pilates or lifting weights, as part of your weekly exercise routine. Try to do these types of exercises for 30 minutes at least 3 days a week. Do not use any products that contain nicotine or tobacco. These products include cigarettes, chewing tobacco, and vaping devices, such as e-cigarettes. If you need help quitting, ask your health care provider. Monitor your blood pressure at home as told by your health care provider. Keep all follow-up visits. This is important. Medicines Take over-the-counter and prescription  medicines only as told by your health care provider. Follow directions carefully. Blood pressure medicines must be taken as prescribed. Do not skip doses of blood pressure medicine. Doing this puts you at risk for problems and can make the medicine less effective. Ask your health care provider about side effects or reactions to medicines that you should watch for. Contact a health care provider if you: Think you are having a reaction to a medicine you are taking. Have headaches that keep coming back (recurring). Feel dizzy. Have swelling in your ankles. Have trouble with your vision. Get help right away if you: Develop a severe headache or confusion. Have unusual weakness or numbness. Feel faint. Have severe pain in your chest or abdomen. Vomit repeatedly. Have trouble breathing. These symptoms may be an emergency. Get help right away. Call 911. Do not wait to see if the symptoms will go away. Do not drive yourself to the hospital. Summary Hypertension is when the force of blood pumping through your arteries is too strong. If this condition is not controlled, it may put you at risk for serious complications. Your personal target blood pressure may vary depending on your medical conditions, your age, and other factors. For most people, a normal blood pressure is less than 120/80. Hypertension is treated with lifestyle changes,  medicines, or a combination of both. Lifestyle changes include losing weight, eating a healthy, low-sodium diet, exercising more, and limiting alcohol. This information is not intended to replace advice given to you by your health care provider. Make sure you discuss any questions you have with your health care provider. Document Revised: 05/04/2021 Document Reviewed: 05/04/2021 Elsevier Patient Education  2024 [employer].

## 2024-02-22 NOTE — Progress Notes (Signed)
 Subjective:    Patient ID: Evan Best, male    DOB: October 12, 1950, 73 y.o.   MRN: [id number]  Chief Complaint  Patient presents with   Medical Management of Chronic Issues    Patient presents today for a 5 month follow-up.   Quality Metric Gaps    Colonoscopy     HPI Discussed the use of AI scribe software for clinical note transcription with the patient, who gave verbal consent to proceed.  History of Present Illness Evan Best is a 73 year old male who presents with concerns about bladder control and foot pain.  He experiences bladder control issues, particularly noting increased urgency when near a bathroom. He speculates that part of the urgency may be psychological or habitual, especially when approaching the bathroom. His PSA blood work was last checked a year ago.  He describes persistent pain in his right foot, specifically at the ball of the foot and affecting the middle and next three toes. The sensation is likened to a bruise and is more pronounced in the morning. There is no history of known injuries to the foot, but he has a history of knee injury and associated minor aches in the leg and shin.  He mentions a past incident of a possible retinal tear, characterized by seeing 'flashing lights' like a 'meteor shower' out of the corner of his eye. This was evaluated by an eye specialist and treated with laser surgery. He has not experienced any falls or trauma prior to this incident.  He has a history of a fall from a bed due to overbalancing, resulting in a visible bruise on his face. This incident occurred a couple of months ago and led to him being temporarily turned down for platelet donation due to the appearance of the bruise.  He reports having minor pain in his left ankle, describing it as feeling 'loose' and not functioning properly for a day, though it resolved by the next day. He attributes some of his near falls to his pets, including a ten-pound cat and a  thirty to forty-pound dog, which sometimes get underfoot.    Past Medical History:  Diagnosis Date   Allergic state 09/22/2013   Anemia 09/07/2015   Arthritis 03/06/2015   Diabetes mellitus type II 1/09   Dysphagia 05/04/2017   Esophageal reflux 03/06/2015   Gout 09/18/2012   Left big toe   Hypertension    Insomnia 09/13/2015   Migraine headache    remote history   OSA (obstructive sleep apnea)    Preventative health care 09/05/2014   Right shoulder pain 02/19/2009   Qualifier: Diagnosis of  By: Georgian ROSALEA CHARM Lamar    RLS (restless legs syndrome) 09/07/2015   Sleep apnea    uses cpap   Tremor 09/07/2015    Past Surgical History:  Procedure Laterality Date   HERNIA REPAIR  1998   umbilical hernia repair   KNEE SURGERY     left knee surgery   TONSILLECTOMY  1959   VASECTOMY      Family History  Problem Relation Age of Onset   Breast cancer Mother    Hypertension Mother    Hyperlipidemia Father    Kidney disease Father    Heart disease Father    Hypertension Father    Colon cancer Neg Hx    Stomach cancer Neg Hx    Colon polyps Neg Hx    Esophageal cancer Neg Hx    Rectal cancer Neg Hx  Social History   Socioeconomic History   Marital status: Widowed    Spouse name: Not on file   Number of children: 1   Years of education: Not on file   Highest education level: Bachelor's degree (e.g., BA, AB, BS)  Occupational History   Not on file  Tobacco Use   Smoking status: Former    Current packs/day: 0.00    Types: Cigarettes    Start date: 2000    Quit date: 2005    Years since quitting: 20.6   Smokeless tobacco: Never   Tobacco comments:    1 pack per year  Vaping Use   Vaping status: Never Used  Substance and Sexual Activity   Alcohol use: No   Drug use: No   Sexual activity: Yes  Other Topics Concern   Not on file  Social History Narrative   Occupation:  Energy manager   Married  -  1 daughter (age 59)    Never Smoked   Alcohol use-no     Drug  use-no          Social Drivers of Corporate investment banker Strain: Low Risk  (02/16/2024)   Overall Financial Resource Strain (CARDIA)    Difficulty of Paying Living Expenses: Not hard at all  Food Insecurity: No Food Insecurity (02/16/2024)   Hunger Vital Sign    Worried About Running Out of Food in the Last Year: Never true    Ran Out of Food in the Last Year: Never true  Transportation Needs: No Transportation Needs (02/16/2024)   PRAPARE - Administrator, Civil Service (Medical): No    Lack of Transportation (Non-Medical): No  Physical Activity: Inactive (02/16/2024)   Exercise Vital Sign    Days of Exercise per Week: 0 days    Minutes of Exercise per Session: Not on file  Stress: No Stress Concern Present (02/16/2024)   [employer] of Occupational Health - Occupational Stress Questionnaire    Feeling of Stress: Not at all  Social Connections: Socially Isolated (02/16/2024)   Social Connection and Isolation Panel    Frequency of Communication with Friends and Family: Never    Frequency of Social Gatherings with Friends and Family: More than three times a week    Attends Religious Services: Never    Database administrator or Organizations: No    Attends Banker Meetings: Not on file    Marital Status: Widowed  Intimate Partner Violence: Not At Risk (06/15/2023)   Humiliation, Afraid, Rape, and Kick questionnaire    Fear of Current or Ex-Partner: No    Emotionally Abused: No    Physically Abused: No    Sexually Abused: No    Outpatient Medications Prior to Visit  Medication Sig Dispense Refill   amLODipine  (NORVASC ) 10 MG tablet TAKE 1 TABLET BY MOUTH EVERY DAY 90 tablet 1   Calcium-Magnesium-Zinc 1000-400-15 MG TABS Take by mouth daily.     cetirizine (ZYRTEC) 10 MG tablet Take 10 mg by mouth daily.     Chelated Potassium 99 MG TABS Take 99 mg by mouth 2 (two) times daily.     Cholecalciferol (D3 ADULT PO) Take by mouth.     Cinnamon 500 MG capsule  Take 500 mg 3 (three) times daily by mouth. +50 mg Chromium in each supplement.     Coenzyme Q10 (CO Q-10) 100 MG CAPS Take 200 mg by mouth daily.     Cyanocobalamin (VITAMIN B 12 PO) Take  1 capsule daily by mouth.     famotidine  (PEPCID ) 40 MG tablet TAKE 1 TABLET BY MOUTH EVERY DAY 90 tablet 1   ferrous sulfate  325 (65 FE) MG tablet Take 1 tablet (325 mg total) by mouth daily with breakfast. 90 tablet 1   Glucosamine-Chondroitin-Vit D3 1500-1200-800 MG-MG-UNIT PACK Take by mouth.     glucose blood test strip One touch Ultra blue -Check sugars daily-  DX E11.69 100 each 11   hydrochlorothiazide  (HYDRODIURIL ) 25 MG tablet TAKE 1 TABLET (25 MG TOTAL) BY MOUTH DAILY. 90 tablet 1   lisinopril  (ZESTRIL ) 40 MG tablet TAKE 1 TABLET BY MOUTH EVERY DAY 90 tablet 1   Melatonin 10 MG TABS Take by mouth.     metFORMIN  (GLUCOPHAGE ) 500 MG tablet TAKE 2 TABLETS (1,000 MG TOTAL) BY MOUTH 2 (TWO) TIMES DAILY WITH A MEAL. 360 tablet 1   Misc Natural Products (TART CHERRY ADVANCED PO) Take by mouth.     Omega-3 Fatty Acids (FISH OIL PO) Take 1,500 mg by mouth daily.     potassium chloride  SA (KLOR-CON  M) 20 MEQ tablet Take 1 tablet (20 mEq total) by mouth daily. 90 tablet 1   Quercetin 250 MG TABS Take by mouth 2 (two) times daily.      simvastatin  (ZOCOR ) 10 MG tablet TAKE 1 TABLET BY MOUTH EVERYDAY AT BEDTIME 90 tablet 0   TURMERIC PO Take by mouth.     vitamin C (ASCORBIC ACID) 500 MG tablet Take 500 mg by mouth 3 (three) times daily.     No facility-administered medications prior to visit.    No Known Allergies  Review of Systems  Constitutional:  Negative for fever and malaise/fatigue.  HENT:  Negative for congestion.   Eyes:  Negative for blurred vision.  Respiratory:  Negative for shortness of breath.   Cardiovascular:  Negative for chest pain, palpitations and leg swelling.  Gastrointestinal:  Negative for abdominal pain, blood in stool and nausea.  Genitourinary:  Positive for frequency and  urgency. Negative for dysuria.  Musculoskeletal:  Positive for joint pain. Negative for falls.  Skin:  Negative for rash.  Neurological:  Negative for dizziness, loss of consciousness and headaches.  Endo/Heme/Allergies:  Negative for environmental allergies.  Psychiatric/Behavioral:  Negative for depression. The patient is not nervous/anxious.        Objective:    Physical Exam Vitals reviewed.  Constitutional:      Appearance: Normal appearance. He is not ill-appearing.  HENT:     Head: Normocephalic and atraumatic.     Nose: Nose normal.  Eyes:     Conjunctiva/sclera: Conjunctivae normal.  Cardiovascular:     Rate and Rhythm: Normal rate.     Pulses: Normal pulses.     Heart sounds: Normal heart sounds. No murmur heard. Pulmonary:     Effort: Pulmonary effort is normal.     Breath sounds: Normal breath sounds. No wheezing.  Abdominal:     Palpations: Abdomen is soft. There is no mass.     Tenderness: There is no abdominal tenderness.  Musculoskeletal:     Cervical back: Normal range of motion.     Right lower leg: No edema.     Left lower leg: No edema.  Skin:    General: Skin is warm and dry.  Neurological:     General: No focal deficit present.     Mental Status: He is alert and oriented to person, place, and time.  Psychiatric:  Mood and Affect: Mood normal.     BP 132/84   Pulse 70   Resp 16   Ht 5' 11 (1.803 m)   Wt 208 lb 6.4 oz (94.5 kg)   SpO2 98%   BMI 29.07 kg/m  Wt Readings from Last 3 Encounters:  02/22/24 208 lb 6.4 oz (94.5 kg)  12/19/23 208 lb 3.2 oz (94.4 kg)  10/16/23 205 lb (93 kg)    Diabetic Foot Exam - Simple   No data filed    Lab Results  Component Value Date   WBC 9.0 09/06/2023   HGB 14.5 09/06/2023   HCT 41.1 09/06/2023   PLT 329.0 09/06/2023   GLUCOSE 175 (H) 09/06/2023   CHOL 109 09/06/2023   TRIG 126.0 09/06/2023   HDL 27.70 (L) 09/06/2023   LDLDIRECT 74.0 08/03/2017   LDLCALC 56 09/06/2023   ALT 19  09/06/2023   AST 16 09/06/2023   NA 139 09/06/2023   K 3.6 09/06/2023   CL 102 09/06/2023   CREATININE 1.02 09/06/2023   BUN 22 09/06/2023   CO2 29 09/06/2023   TSH 3.43 09/06/2023   PSA 0.75 01/26/2023   HGBA1C 7.7 (H) 09/06/2023   MICROALBUR 16.8 (H) 09/06/2023    Lab Results  Component Value Date   TSH 3.43 09/06/2023   Lab Results  Component Value Date   WBC 9.0 09/06/2023   HGB 14.5 09/06/2023   HCT 41.1 09/06/2023   MCV 86.7 09/06/2023   PLT 329.0 09/06/2023   Lab Results  Component Value Date   NA 139 09/06/2023   K 3.6 09/06/2023   CO2 29 09/06/2023   GLUCOSE 175 (H) 09/06/2023   BUN 22 09/06/2023   CREATININE 1.02 09/06/2023   BILITOT 0.6 09/06/2023   ALKPHOS 39 09/06/2023   AST 16 09/06/2023   ALT 19 09/06/2023   PROT 6.8 09/06/2023   ALBUMIN 4.0 09/06/2023   CALCIUM 8.9 09/06/2023   GFR 73.82 09/06/2023   Lab Results  Component Value Date   CHOL 109 09/06/2023   Lab Results  Component Value Date   HDL 27.70 (L) 09/06/2023   Lab Results  Component Value Date   LDLCALC 56 09/06/2023   Lab Results  Component Value Date   TRIG 126.0 09/06/2023   Lab Results  Component Value Date   CHOLHDL 4 09/06/2023   Lab Results  Component Value Date   HGBA1C 7.7 (H) 09/06/2023       Assessment & Plan:  Diabetes mellitus type 2 in obese Assessment & Plan: hgba1c acceptable, minimize simple carbs. Increase exercise as tolerated. Continue current meds   Orders: -     Hemoglobin A1c  Essential hypertension Assessment & Plan: Well controlled, no changes to meds. Encouraged heart healthy diet such as the DASH diet and exercise as tolerated.    Orders: -     Comprehensive metabolic panel with GFR -     CBC with Differential/Platelet  Chronic gout without tophus, unspecified cause, unspecified site Assessment & Plan: Hydrate and monitor   Orders: -     Uric acid  Hyperlipidemia, mixed Assessment & Plan: Tolerating statin, encouraged  heart healthy diet, avoid trans fats, minimize simple carbs and saturated fats. Increase exercise as tolerated  Orders: -     Lipid panel -     TSH  MUSCLE CRAMPS Assessment & Plan: Hydrate and monitor    Colon cancer screening -     Ambulatory referral to Gastroenterology  Myalgia -  Magnesium    Assessment and Plan Assessment & Plan Chronic right foot pain with osteoarthritis Chronic right foot pain due to osteoarthritis, primarily affecting the ball of the foot and toes. Pain is described as a bruise-like sensation, particularly in the morning, consistent with arthritis. Symptoms are likely due to arthritic changes causing toe curling and pressure on the ball of the foot. Surgical intervention is considered a last resort due to the long recovery time and potential complications, especially in older patients. - Advise ice and elevation twice a day to reduce swelling. - Recommend topical lidocaine for pain relief. - Discuss surgical intervention as a last resort if pain becomes severe or skin breakdown occurs. - Suggest considering Hoka recovery sandals for foot pain relief.  Urinary frequency and urgency Increased urinary frequency and urgency, possibly exacerbated by psychological factors and proximity to the bathroom. Neurological factors may contribute to urgency, especially as he ages. - Order PSA blood test to assess prostate health.  Recording duration: 40 minutes     Harlene Horton, MD

## 2024-02-23 LAB — LIPID PANEL
Cholesterol: 115 mg/dL (ref 0–200)
HDL: 27.7 mg/dL — ABNORMAL LOW (ref >39.00)
LDL Cholesterol: 59 mg/dL (ref 0–99)
NonHDL: 87.35
Total CHOL/HDL Ratio: 4
Triglycerides: 143 mg/dL (ref 0.0–149.0)
VLDL: 28.6 mg/dL (ref 0.0–40.0)

## 2024-02-23 LAB — COMPREHENSIVE METABOLIC PANEL WITH GFR
ALT: 17 U/L (ref 0–53)
AST: 15 U/L (ref 0–37)
Albumin: 4.1 g/dL (ref 3.5–5.2)
Alkaline Phosphatase: 44 U/L (ref 39–117)
BUN: 17 mg/dL (ref 6–23)
CO2: 29 meq/L (ref 19–32)
Calcium: 9.1 mg/dL (ref 8.4–10.5)
Chloride: 100 meq/L (ref 96–112)
Creatinine, Ser: 1.02 mg/dL (ref 0.40–1.50)
GFR: 73.58 mL/min (ref >60.00)
Glucose, Bld: 211 mg/dL — ABNORMAL HIGH (ref 70–99)
Potassium: 3.8 meq/L (ref 3.5–5.1)
Sodium: 137 meq/L (ref 135–145)
Total Bilirubin: 0.6 mg/dL (ref 0.2–1.2)
Total Protein: 6.4 g/dL (ref 6.0–8.3)

## 2024-02-23 LAB — TSH: TSH: 2.17 u[IU]/mL (ref 0.35–5.50)

## 2024-02-23 LAB — CBC WITH DIFFERENTIAL/PLATELET
Basophils Absolute: 0.1 K/uL (ref 0.0–0.1)
Basophils Relative: 1 % (ref 0.0–3.0)
Eosinophils Absolute: 0.7 K/uL (ref 0.0–0.7)
Eosinophils Relative: 9 % — ABNORMAL HIGH (ref 0.0–5.0)
HCT: 40.8 % (ref 39.0–52.0)
Hemoglobin: 14.6 g/dL (ref 13.0–17.0)
Lymphocytes Relative: 20.1 % (ref 12.0–46.0)
Lymphs Abs: 1.6 K/uL (ref 0.7–4.0)
MCHC: 35.9 g/dL (ref 30.0–36.0)
MCV: 85.3 fl (ref 78.0–100.0)
Monocytes Absolute: 1 K/uL (ref 0.1–1.0)
Monocytes Relative: 11.9 % (ref 3.0–12.0)
Neutro Abs: 4.7 K/uL (ref 1.4–7.7)
Neutrophils Relative %: 58 % (ref 43.0–77.0)
Platelets: 293 K/uL (ref 150.0–400.0)
RBC: 4.78 Mil/uL (ref 4.22–5.81)
RDW: 13.4 % (ref 11.5–15.5)
WBC: 8.1 K/uL (ref 4.0–10.5)

## 2024-02-23 LAB — URIC ACID: Uric Acid, Serum: 6.3 mg/dL (ref 4.0–7.8)

## 2024-02-23 LAB — MAGNESIUM: Magnesium: 1.7 mg/dL (ref 1.5–2.5)

## 2024-02-23 LAB — HEMOGLOBIN A1C: Hgb A1c MFr Bld: 8 % — ABNORMAL HIGH (ref 4.6–6.5)

## 2024-02-24 ENCOUNTER — Ambulatory Visit: Payer: Self-pay | Admitting: Family

## 2024-02-27 MED ORDER — DAPAGLIFLOZIN PROPANEDIOL 10 MG PO TABS: 10.0000 mg | ORAL_TABLET | Freq: Every day | ORAL | 3 refills | Status: DC

## 2024-03-27 ENCOUNTER — Other Ambulatory Visit: Payer: Self-pay | Admitting: Family Medicine

## 2024-04-09 DIAGNOSIS — D1801 Hemangioma of skin and subcutaneous tissue: Secondary | ICD-10-CM | POA: Diagnosis not present

## 2024-04-09 DIAGNOSIS — L578 Other skin changes due to chronic exposure to nonionizing radiation: Secondary | ICD-10-CM | POA: Diagnosis not present

## 2024-04-09 DIAGNOSIS — L821 Other seborrheic keratosis: Secondary | ICD-10-CM | POA: Diagnosis not present

## 2024-04-09 DIAGNOSIS — L814 Other melanin hyperpigmentation: Secondary | ICD-10-CM | POA: Diagnosis not present

## 2024-04-09 DIAGNOSIS — D229 Melanocytic nevi, unspecified: Secondary | ICD-10-CM | POA: Diagnosis not present

## 2024-04-09 DIAGNOSIS — Z86018 Personal history of other benign neoplasm: Secondary | ICD-10-CM | POA: Diagnosis not present

## 2024-04-16 ENCOUNTER — Other Ambulatory Visit: Payer: Self-pay | Admitting: Family Medicine

## 2024-04-16 MED ORDER — GLUCOSE BLOOD VI STRP: ORAL_STRIP | 2 refills | Status: AC

## 2024-04-16 NOTE — Telephone Encounter (Signed)
 Copied from CRM [id number]. Topic: Clinical - Medication Refill >> Apr 16, 2024  3:33 PM Dedra B wrote: Medication: glucose blood test strip  Has the patient contacted their pharmacy? Yes, prescription expired   This is the patient's preferred pharmacy:  [id number] [place], [place] - [address] [place] [address] Phone: [phone] Fax: [phone]  Is this the correct pharmacy for this prescription? Yes  Has the prescription been filled recently? No  Is the patient out of the medication? Yes  Has the patient been seen for an appointment in the last year OR does the patient have an upcoming appointment? Yes  Can we respond through MyChart? Yes  Agent: Please be advised that Rx refills may take up to 3 business days. We ask that you follow-up with your pharmacy.

## 2024-04-17 ENCOUNTER — Encounter: Payer: Self-pay | Admitting: Podiatry

## 2024-04-17 ENCOUNTER — Ambulatory Visit: Admitting: Podiatry

## 2024-04-17 DIAGNOSIS — L84 Corns and callosities: Secondary | ICD-10-CM | POA: Diagnosis not present

## 2024-04-17 DIAGNOSIS — E1142 Type 2 diabetes mellitus with diabetic polyneuropathy: Secondary | ICD-10-CM

## 2024-04-23 ENCOUNTER — Ambulatory Visit

## 2024-04-23 DIAGNOSIS — G5761 Lesion of plantar nerve, right lower limb: Secondary | ICD-10-CM

## 2024-04-23 DIAGNOSIS — D3610 Benign neoplasm of peripheral nerves and autonomic nervous system, unspecified: Secondary | ICD-10-CM

## 2024-04-23 NOTE — Progress Notes (Signed)
  Subjective:  Patient ID: Evan Best, male    DOB: 1950/09/07,  MRN: [id number]  Chief Complaint  Patient presents with   Neuroma    Rm11 Neuroma right 2nd Ims/1 month/no treatment/numbness tingling with walking and pressure    Discussed the use of AI scribe software for clinical note transcription with the patient, who gave verbal consent to proceed.  History of Present Illness Evan Best is a 73 year old male with diabetes who presents with discomfort in the right foot.  He experiences a sensation of walking on a pebble under the second and third toes of the right foot. This discomfort is significant but not painful. A metatarsal pad provides relief and has been effective for about a week and a half.   He is working on improving his A1c levels as his carbohydrate intake monitoring has been less diligent, affecting his diabetes management.      Objective:    Physical Exam MUSCULOSKELETAL: Right foot. 5/5 muscle strength to all major pedal muscle groups. Rectus foot shape. Mild medial deviation of second digit and lateral deviation of third digit. Negative mulder's click. No pain with 2nd or 3rd MTP movement, or with 2nd/3rd metatarsal palpation. Significant pain to palpation 2nd interspace.  Vasc/derm: within normal limits. Palpable DP and PT pulses b/l. No open wounds or lesions.  Neuro: protective sensation intact. Subjective description of stepping on a stone or balled up sock.      Results  3 views of the right foot were acquired. These demonstrate medial deviation of the 2nd MTP and lateral deviation at the 3rd MTP. There is no acute osseous pathology identified.    Assessment:   1. Neuroma      Plan:  Patient was evaluated and treated and all questions answered.  Assessment and Plan Assessment & Plan Right foot neuroma Discomfort under second and third toes, consistent with neuroma. Significant relief from metatarsal pads. - Recommend  over-the-counter orthotics with built-in metatarsal pads. - Advise trial of orthotics for one month to assess improvement. - Consider cortisone injection if symptoms persist. - Discuss surgical removal of the nerve as a last resort if conservative measures fail. - Encourage monitoring and improvement of A1c levels.   RTC as needed Prentice Ovens, DPM

## 2024-04-23 NOTE — Progress Notes (Signed)
  Subjective:  Patient ID: Evan Best, male    DOB: 28-Mar-1951,  MRN: [id number]  Evan Best presents to clinic today for at risk foot care with history of diabetic neuropathy and callus(es) right foot and painful thick toenails that are difficult to trim. Painful toenails interfere with ambulation. Aggravating factors include wearing enclosed shoe gear. Pain is relieved with periodic professional debridement. Painful calluses are aggravated when weightbearing with and without shoegear. Pain is relieved with periodic professional debridement. Patient states he has had right foot pain for the past few weeks. Denies any trauma. Chief Complaint  Patient presents with   Diabetes    DFC NIDDM A1C 8.0. Toenail trim. LOV with PCP 02/22/24.    PCP is Domenica Harlene LABOR, MD.  No Known Allergies  Review of Systems: Negative except as noted in the HPI.  Objective: No changes noted in today's physical examination. There were no vitals filed for this visit. Evan Best is a pleasant 73 y.o. male in NAD. AAO x 3.  Vascular Examination: Capillary refill time immediate b/l. Palpable pedal pulses. Pedal hair present b/l. No pain with calf compression b/l. Skin temperature gradient WNL b/l. No cyanosis or clubbing b/l. No ischemia or gangrene noted b/l. No varicosities noted.  Neurological Examination: Sensation grossly intact b/l with 10 gram monofilament. Vibratory sensation intact b/l. Pt has subjective symptoms of neuropathy.  Dermatological Examination: Pedal skin with normal turgor, texture and tone b/l.  No open wounds. No interdigital macerations.   Toenails 1-5 b/l thick, discolored, elongated with subungual debris and pain on dorsal palpation.   Hyperkeratotic lesion(s) medial IPJ of right great toe.  No erythema, no edema, no drainage, no fluctuance.  Musculoskeletal Examination: Normal muscle strength 5/5 to all lower extremity muscle groups bilaterally. No pain, crepitus or  joint limitation noted with ROM b/l LE. No gross bony pedal deformities b/l. Patient ambulates independently without assistive aids.  Radiographs: None  Assessment/Plan: 1. Callus   2. Diabetic peripheral neuropathy associated with type 2 diabetes mellitus [facility])   Consent given for treatment. Patient examined. All patient's and/or POA's questions/concerns addressed on today's visit. Mycotic toenails 1-5 debrided in length and girth without incident. Callus(es) medial IPJ of right great toe pared with sharp debridement without incident. Continue foot and shoe inspections daily. Treatment was provided by assistant Andrez Manchester under my supervision. Monitor blood glucose per PCP/Endocrinologist's recommendations.Continue soft, supportive shoe gear daily. Report any pedal injuries to medical professional. Call office if there are any quesitons/concerns. Refer to Dr. Prentice Ovens for further work up of right foot pain.  Return in about 3 months (around 07/18/2024).  Evan Best, DPM      [place] LOCATION: [address] [place] [address]                   Office [phone]   [facility] LOCATION: [address] [place], [place] [address] Office [phone]

## 2024-04-28 ENCOUNTER — Other Ambulatory Visit: Payer: Self-pay | Admitting: Family Medicine

## 2024-05-01 ENCOUNTER — Other Ambulatory Visit: Payer: Self-pay | Admitting: Family Medicine

## 2024-05-01 ENCOUNTER — Ambulatory Visit

## 2024-05-01 VITALS — Ht 71.0 in | Wt 205.0 lb

## 2024-05-01 DIAGNOSIS — Z860100 Personal history of colon polyps, unspecified: Secondary | ICD-10-CM

## 2024-05-01 MED ORDER — NA SULFATE-K SULFATE-MG SULF 17.5-3.13-1.6 GM/177ML PO SOLN: 1.0000 | Freq: Once | ORAL | 0 refills | Status: AC

## 2024-05-01 NOTE — Progress Notes (Signed)
 No egg or soy allergy known to patient  No issues known to pt with past sedation with any surgeries or procedures Patient denies ever being told they had issues or difficulty with intubation  No FH of Malignant Hyperthermia Pt is not on diet pills Pt is not on  home 02  Pt is not on blood thinners  Pt denies issues with constipation  No A fib or A flutter Have any cardiac testing pending--NO Pt can ambulate- Independently ( left knee issues) Pt denies use of chewing tobacco Discussed diabetic I weight loss medication holds Discussed NSAID holds Checked BMI Pt instructed to use Singlecare.com or GoodRx for a price reduction on prep  Patient's chart reviewed by Norleen Schillings CNRA prior to previsit and patient appropriate for the [facility].  Pre visit completed and red dot placed by patient's name on their procedure day (on provider's schedule).

## 2024-05-13 ENCOUNTER — Encounter (INDEPENDENT_AMBULATORY_CARE_PROVIDER_SITE_OTHER): Admitting: Ophthalmology

## 2024-05-13 DIAGNOSIS — H33301 Unspecified retinal break, right eye: Secondary | ICD-10-CM | POA: Diagnosis not present

## 2024-05-13 DIAGNOSIS — H35033 Hypertensive retinopathy, bilateral: Secondary | ICD-10-CM

## 2024-05-13 DIAGNOSIS — H43813 Vitreous degeneration, bilateral: Secondary | ICD-10-CM

## 2024-05-13 DIAGNOSIS — I10 Essential (primary) hypertension: Secondary | ICD-10-CM | POA: Diagnosis not present

## 2024-05-15 ENCOUNTER — Encounter: Admitting: Gastroenterology

## 2024-06-20 ENCOUNTER — Other Ambulatory Visit: Payer: Self-pay | Admitting: Medical Genetics

## 2024-06-20 ENCOUNTER — Ambulatory Visit (INDEPENDENT_AMBULATORY_CARE_PROVIDER_SITE_OTHER): Payer: Medicare Other

## 2024-06-20 VITALS — BP 138/74 | HR 77 | Temp 97.3°F | Ht 71.0 in | Wt 207.8 lb

## 2024-06-20 DIAGNOSIS — Z0000 Encounter for general adult medical examination without abnormal findings: Secondary | ICD-10-CM | POA: Diagnosis not present

## 2024-06-20 NOTE — Patient Instructions (Addendum)
 Evan Best,  Thank you for taking the time for your Medicare Wellness Visit. I appreciate your continued commitment to your health goals. Please review the care plan we discussed, and feel free to reach out if I can assist you further.  Please note that Annual Wellness Visits do not include a physical exam. Some assessments may be limited, especially if the visit was conducted virtually. If needed, we may recommend an in-person follow-up with your provider.  Ongoing Care Seeing your primary care provider every 3 to 6 months helps us  monitor your health and provide consistent, personalized care.   Referrals If a referral was made during today's visit and you haven't received any updates within two weeks, please contact the referred provider directly to check on the status.  Recommended Screenings:  Health Maintenance  Topic Date Due   DTaP/Tdap/Td vaccine (2 - Td or Tdap) 06/20/2021   Flu Shot  02/09/2024   Colon Cancer Screening  06/26/2024*   COVID-19 Vaccine (3 - Pfizer risk series) 07/10/2024*   Hemoglobin A1C  08/24/2024   Yearly kidney health urinalysis for diabetes  09/05/2024   Complete foot exam   01/02/2025   Yearly kidney function blood test for diabetes  02/21/2025   Eye exam for diabetics  05/13/2025   Medicare Annual Wellness Visit  06/20/2025   Pneumococcal Vaccine for age over 32  Completed   Hepatitis C Screening  Completed   Zoster (Shingles) Vaccine  Completed   Meningitis B Vaccine  Aged Out  *Topic was postponed. The date shown is not the original due date.       06/20/2024    4:08 PM  Advanced Directives  Does Patient Have a Medical Advance Directive? Yes  Type of Estate Agent of [place];Living will  Does patient want to make changes to medical advance directive? No - Patient declined  Copy of Healthcare Power of Attorney in Chart? No - copy requested    Vision: Annual vision screenings are recommended for early detection of  glaucoma, cataracts, and diabetic retinopathy. These exams can also reveal signs of chronic conditions such as diabetes and high blood pressure.  Dental: Annual dental screenings help detect early signs of oral cancer, gum disease, and other conditions linked to overall health, including heart disease and diabetes.  Please see the attached documents for additional preventive care recommendations.

## 2024-06-20 NOTE — Progress Notes (Signed)
 Chief Complaint  Patient presents with   Medicare Wellness     Subjective:   Evan Best is a 73 y.o. male who presents for a Medicare Annual Wellness Visit.  Visit info / Clinical Intake: Medicare Wellness Visit Type:: Subsequent Annual Wellness Visit Persons participating in visit and providing information:: patient Medicare Wellness Visit Mode:: In-person (required for WTM) Interpreter Needed?: No Pre-visit prep was completed: yes AWV questionnaire completed by patient prior to visit?: yes Date:: 06/15/24 Living arrangements:: with family/others Patient's Overall Health Status Rating: good Typical amount of pain: some Does pain affect daily life?: no  Dietary Habits and Nutritional Risks How many meals a day?: 3 Eats fruit and vegetables daily?: yes Most meals are obtained by: preparing own meals In the last 2 weeks, have you had any of the following?: none Diabetic:: (!) yes Any non-healing wounds?: no How often do you check your BS?: as needed Would you like to be referred to a Nutritionist or for Diabetic Management? : no  Functional Status Activities of Daily Living (to include ambulation/medication): Independent Ambulation: Independent with device- listed below Home Assistive Devices/Equipment: Eyeglasses; CPAP Medication Administration: Independent Home Management (perform basic housework or laundry): Independent Manage your own finances?: yes Primary transportation is: driving Concerns about vision?: no *vision screening is required for WTM* Concerns about hearing?: no  Fall Screening Falls in the past year?: 0 Number of falls in past year: 0 Was there an injury with Fall?: 0 Fall Risk Category Calculator: 0 Patient Fall Risk Level: Low Fall Risk  Fall Risk Patient at Risk for Falls Due to: No Fall Risks Fall risk Follow up: Falls evaluation completed  Home and Transportation Safety: All rugs have non-skid backing?: N/A, no rugs All stairs or  steps have railings?: yes Grab bars in the bathtub or shower?: (!) no Have non-skid surface in bathtub or shower?: yes Good home lighting?: yes Regular seat belt use?: yes Hospital stays in the last year:: no  Cognitive Assessment Difficulty concentrating, remembering, or making decisions? : no Will 6CIT or Mini Cog be Completed: yes What year is it?: 0 points What month is it?: 0 points Give patient an address phrase to remember (5 components): [address] [place]  About what time is it?: 0 points Count backwards from 20 to 1: 0 points Say the months of the year in reverse: 0 points Repeat the address phrase from earlier: 0 points 6 CIT Score: 0 points  Advance Directives (For Healthcare) Does Patient Have a Medical Advance Directive?: Yes Does patient want to make changes to medical advance directive?: No - Patient declined Type of Advance Directive: Healthcare Power of [place]; Living will Copy of Healthcare Power of Attorney in Chart?: No - copy requested Copy of Living Will in Chart?: No - copy requested  Reviewed/Updated  Reviewed/Updated: Reviewed All (Medical, Surgical, Family, Medications, Allergies, Care Teams, Patient Goals)    Allergies (verified) Patient has no known allergies.   Current Medications (verified) Outpatient Encounter Medications as of 06/20/2024  Medication Sig   amLODipine  (NORVASC ) 10 MG tablet TAKE 1 TABLET BY MOUTH EVERY DAY   Calcium-Magnesium-Zinc 1000-400-15 MG TABS Take by mouth daily.   cetirizine (ZYRTEC) 10 MG tablet Take 10 mg by mouth daily.   Chelated Potassium 99 MG TABS Take 99 mg by mouth 2 (two) times daily.   Cholecalciferol (D3 ADULT PO) Take by mouth.   Cinnamon 500 MG capsule Take 500 mg 3 (three) times daily by mouth. +50 mg Chromium  in each supplement.   Coenzyme Q10 (CO Q-10) 100 MG CAPS Take 200 mg by mouth daily.   Cyanocobalamin (VITAMIN B 12 PO) Take 1 capsule daily by mouth.   dapagliflozin  propanediol  (FARXIGA ) 10 MG TABS tablet Take 1 tablet (10 mg total) by mouth daily before breakfast.   famotidine  (PEPCID ) 40 MG tablet TAKE 1 TABLET BY MOUTH EVERY DAY   ferrous sulfate  325 (65 FE) MG tablet Take 1 tablet (325 mg total) by mouth daily with breakfast.   Glucosamine-Chondroitin-Vit D3 1500-1200-800 MG-MG-UNIT PACK Take by mouth.   glucose blood test strip One touch Ultra blue -Check sugars daily-  DX E11.69   hydrochlorothiazide  (HYDRODIURIL ) 25 MG tablet TAKE 1 TABLET (25 MG TOTAL) BY MOUTH DAILY.   lisinopril  (ZESTRIL ) 40 MG tablet TAKE 1 TABLET BY MOUTH EVERY DAY   Melatonin 10 MG TABS Take by mouth.   metFORMIN  (GLUCOPHAGE ) 500 MG tablet TAKE 2 TABLETS (1,000 MG TOTAL) BY MOUTH 2 (TWO) TIMES DAILY WITH A MEAL.   Misc Natural Products (TART CHERRY ADVANCED PO) Take by mouth.   Multiple Vitamins-Minerals (MENS MULTIVITAMIN PO) Take by mouth.   Omega-3 Fatty Acids (FISH OIL PO) Take 1,500 mg by mouth daily.   potassium chloride  SA (KLOR-CON  M) 20 MEQ tablet TAKE 1 TABLET BY MOUTH EVERY DAY   Quercetin 250 MG TABS Take by mouth 2 (two) times daily.    simvastatin  (ZOCOR ) 10 MG tablet Take 1 tablet (10 mg total) by mouth at bedtime.   TURMERIC PO Take by mouth.   vitamin C (ASCORBIC ACID) 500 MG tablet Take 500 mg by mouth 3 (three) times daily.   No facility-administered encounter medications on file as of 06/20/2024.    History: Past Medical History:  Diagnosis Date   Allergic state 09/22/2013   Anemia 09/07/2015   Arthritis 03/06/2015   Diabetes mellitus type II 07/2007   Dysphagia 05/04/2017   Esophageal reflux 03/06/2015   Gout 09/18/2012   Left big toe   Hypertension    Insomnia 09/13/2015   Migraine headache    remote history   OSA (obstructive sleep apnea)    Preventative health care 09/05/2014   Right shoulder pain 02/19/2009   Qualifier: Diagnosis of  By: [place] ROSALEA CHARM Lamar    RLS (restless legs syndrome) 09/07/2015   Sleep apnea    uses CPAP   Tremor 09/07/2015    Past Surgical History:  Procedure Laterality Date   HERNIA REPAIR  1998   umbilical hernia repair   KNEE SURGERY     left knee surgery   TONSILLECTOMY  1959   VASECTOMY     Family History  Problem Relation Age of Onset   Breast cancer Mother    Hypertension Mother    Hyperlipidemia Father    Kidney disease Father    Heart disease Father    Hypertension Father    Colon cancer Neg Hx    Stomach cancer Neg Hx    Colon polyps Neg Hx    Esophageal cancer Neg Hx    Rectal cancer Neg Hx    Social History   Occupational History   Not on file  Tobacco Use   Smoking status: Former    Current packs/day: 0.00    Types: Cigarettes    Start date: 2000    Quit date: 2005    Years since quitting: 20.9   Smokeless tobacco: Never   Tobacco comments:    1 pack per year  Vaping Use  Vaping status: Never Used  Substance and Sexual Activity   Alcohol use: No   Drug use: No   Sexual activity: Yes   Tobacco Counseling Counseling given: No Tobacco comments: 1 pack per year  SDOH Screenings   Food Insecurity: No Food Insecurity (06/20/2024)  Housing: Low Risk (06/20/2024)  Transportation Needs: No Transportation Needs (06/20/2024)  Utilities: Not At Risk (06/20/2024)  Alcohol Screen: Low Risk (02/16/2024)  Depression (PHQ2-9): Low Risk (06/20/2024)  Financial Resource Strain: Low Risk (06/15/2024)  Physical Activity: Inactive (06/20/2024)  Social Connections: Socially Isolated (06/20/2024)  Stress: No Stress Concern Present (06/20/2024)  Tobacco Use: Medium Risk (06/20/2024)  Health Literacy: Adequate Health Literacy (06/20/2024)   See flowsheets for full screening details  Depression Screen PHQ 2 & 9 Depression Scale- Over the past 2 weeks, how often have you been bothered by any of the following problems? Little interest or pleasure in doing things: 0 Feeling down, depressed, or hopeless (PHQ Adolescent also includes...irritable): 0 PHQ-2 Total Score: 0 Trouble falling  or staying asleep, or sleeping too much: 0 Feeling tired or having little energy: 1 Poor appetite or overeating (PHQ Adolescent also includes...weight loss): 0 Feeling bad about yourself - or that you are a failure or have let yourself or your family down: 0 Trouble concentrating on things, such as reading the newspaper or watching television (PHQ Adolescent also includes...like school work): 0 Moving or speaking so slowly that other people could have noticed. Or the opposite - being so fidgety or restless that you have been moving around a lot more than usual: 0 Thoughts that you would be better off dead, or of hurting yourself in some way: 0 PHQ-9 Total Score: 4 If you checked off any problems, how difficult have these problems made it for you to do your work, take care of things at home, or get along with other people?: Not difficult at all     Goals Addressed             This Visit's Progress    Remain active               Objective:    Today's Vitals   06/20/24 1555  BP: 138/74  Pulse: 77  Temp: (!) 97.3 F (36.3 C)  TempSrc: Oral  SpO2: 98%  Weight: 207 lb 12.8 oz (94.3 kg)  Height: 5' 11 (1.803 m)   Body mass index is 28.98 kg/m.  Hearing/Vision screen Hearing Screening - Comments:: Denies hearing difficulties   Vision Screening - Comments:: Wears rx glasses - up to date with routine eye exams with  Dr Octavia Immunizations and Health Maintenance Health Maintenance  Topic Date Due   DTaP/Tdap/Td (2 - Td or Tdap) 06/20/2021   Influenza Vaccine  02/09/2024   Colonoscopy  06/26/2024 (Originally 08/03/2023)   COVID-19 Vaccine (3 - Pfizer risk series) 07/10/2024 (Originally 12/10/2019)   HEMOGLOBIN A1C  08/24/2024   Diabetic kidney evaluation - Urine ACR  09/05/2024   FOOT EXAM  01/02/2025   Diabetic kidney evaluation - eGFR measurement  02/21/2025   OPHTHALMOLOGY EXAM  05/13/2025   Medicare Annual Wellness (AWV)  06/20/2025   Pneumococcal Vaccine: 50+ Years   Completed   Hepatitis C Screening  Completed   Zoster Vaccines- Shingrix  Completed   Meningococcal B Vaccine  Aged Out        Assessment/Plan:  This is a routine wellness examination for Evan Best.  Patient Care Team: Domenica Harlene LABOR, MD as PCP - General (Family Medicine)  Octavia Charleston, MD as Consulting Physician (Ophthalmology) Neysa Reggy BIRCH, MD as Consulting Physician (Pulmonary Disease)  I have personally reviewed and noted the following in the patients chart:   Medical and social history Use of alcohol, tobacco or illicit drugs  Current medications and supplements including opioid prescriptions. Functional ability and status Nutritional status Physical activity Advanced directives List of other physicians Hospitalizations, surgeries, and ER visits in previous 12 months Vitals Screenings to include cognitive, depression, and falls Referrals and appointments  No orders of the defined types were placed in this encounter.  In addition, I have reviewed and discussed with patient certain preventive protocols, quality metrics, and best practice recommendations. A written personalized care plan for preventive services as well as general preventive health recommendations were provided to patient.   Evan LELON Blush, LPN   [id number]/[id number]   Return in 53 weeks (on 06/26/2025).  After Visit Summary: (In Person-Declined) Patient declined AVS at this time.  Nurse Notes: HM Addressed: Colonoscopy scheduled for 06/26/24

## 2024-06-26 ENCOUNTER — Ambulatory Visit: Admitting: Gastroenterology

## 2024-06-26 ENCOUNTER — Encounter: Payer: Self-pay | Admitting: Gastroenterology

## 2024-06-26 VITALS — BP 152/78 | HR 66 | Temp 97.4°F | Resp 11 | Ht 71.0 in | Wt 205.0 lb

## 2024-06-26 DIAGNOSIS — Z860100 Personal history of colon polyps, unspecified: Secondary | ICD-10-CM | POA: Diagnosis not present

## 2024-06-26 DIAGNOSIS — K5520 Angiodysplasia of colon without hemorrhage: Secondary | ICD-10-CM

## 2024-06-26 DIAGNOSIS — Q438 Other specified congenital malformations of intestine: Secondary | ICD-10-CM

## 2024-06-26 DIAGNOSIS — K6389 Other specified diseases of intestine: Secondary | ICD-10-CM | POA: Diagnosis not present

## 2024-06-26 DIAGNOSIS — K635 Polyp of colon: Secondary | ICD-10-CM | POA: Diagnosis not present

## 2024-06-26 DIAGNOSIS — D175 Benign lipomatous neoplasm of intra-abdominal organs: Secondary | ICD-10-CM | POA: Diagnosis not present

## 2024-06-26 DIAGNOSIS — K641 Second degree hemorrhoids: Secondary | ICD-10-CM | POA: Diagnosis not present

## 2024-06-26 DIAGNOSIS — Z1211 Encounter for screening for malignant neoplasm of colon: Secondary | ICD-10-CM | POA: Diagnosis not present

## 2024-06-26 DIAGNOSIS — D123 Benign neoplasm of transverse colon: Secondary | ICD-10-CM

## 2024-06-26 MED ORDER — SODIUM CHLORIDE 0.9 % IV SOLN: 500.0000 mL | Freq: Once | INTRAVENOUS | Status: DC

## 2024-06-26 NOTE — Progress Notes (Signed)
 Pt's states no medical or surgical changes since previsit or office visit.

## 2024-06-26 NOTE — Progress Notes (Unsigned)
 1453 BP 206/95, Labetalol given IV, MD update, vss

## 2024-06-26 NOTE — Progress Notes (Unsigned)
 Report given to PACU, vss

## 2024-06-26 NOTE — Progress Notes (Unsigned)
 Called to room to assist during endoscopic procedure.  Patient ID and intended procedure confirmed with present staff. Received instructions for my participation in the procedure from the performing physician.

## 2024-06-26 NOTE — Progress Notes (Unsigned)
 GASTROENTEROLOGY PROCEDURE H&P NOTE   Primary Care Physician: Domenica Harlene LABOR, MD  HPI: Evan Best is a 73 y.o. male who presents for Colonoscopy for history of colon polyps and previous inadequate preparation.  Past Medical History:  Diagnosis Date   Allergic state 09/22/2013   Anemia 09/07/2015   Arthritis 03/06/2015   Diabetes mellitus type II 07/2007   Dysphagia 05/04/2017   Esophageal reflux 03/06/2015   Gout 09/18/2012   Left big toe   Hypertension    Insomnia 09/13/2015   Migraine headache    remote history   OSA (obstructive sleep apnea)    Preventative health care 09/05/2014   Right shoulder pain 02/19/2009   Qualifier: Diagnosis of  By: Georgian ROSALEA CHARM Lamar    RLS (restless legs syndrome) 09/07/2015   Sleep apnea    uses CPAP   Tremor 09/07/2015   Past Surgical History:  Procedure Laterality Date   HERNIA REPAIR  1998   umbilical hernia repair   KNEE SURGERY     left knee surgery   TONSILLECTOMY  1959   VASECTOMY     Current Outpatient Medications  Medication Sig Dispense Refill   amLODipine  (NORVASC ) 10 MG tablet TAKE 1 TABLET BY MOUTH EVERY DAY 90 tablet 1   Calcium-Magnesium-Zinc 1000-400-15 MG TABS Take by mouth daily.     cetirizine (ZYRTEC) 10 MG tablet Take 10 mg by mouth daily.     Chelated Potassium 99 MG TABS Take 99 mg by mouth 2 (two) times daily.     Cholecalciferol (D3 ADULT PO) Take by mouth.     Cinnamon 500 MG capsule Take 500 mg 3 (three) times daily by mouth. +50 mg Chromium in each supplement.     Coenzyme Q10 (CO Q-10) 100 MG CAPS Take 200 mg by mouth daily.     Cyanocobalamin (VITAMIN B 12 PO) Take 1 capsule daily by mouth.     dapagliflozin  propanediol (FARXIGA ) 10 MG TABS tablet Take 1 tablet (10 mg total) by mouth daily before breakfast. 30 tablet 3   famotidine  (PEPCID ) 40 MG tablet TAKE 1 TABLET BY MOUTH EVERY DAY 90 tablet 1   ferrous sulfate  325 (65 FE) MG tablet Take 1 tablet (325 mg total) by mouth daily with  breakfast. 90 tablet 1   Glucosamine-Chondroitin-Vit D3 1500-1200-800 MG-MG-UNIT PACK Take by mouth.     glucose blood test strip One touch Ultra blue -Check sugars daily-  DX E11.69 100 each 2   hydrochlorothiazide  (HYDRODIURIL ) 25 MG tablet TAKE 1 TABLET (25 MG TOTAL) BY MOUTH DAILY. 90 tablet 1   lisinopril  (ZESTRIL ) 40 MG tablet TAKE 1 TABLET BY MOUTH EVERY DAY 90 tablet 1   Melatonin 10 MG TABS Take by mouth.     metFORMIN  (GLUCOPHAGE ) 500 MG tablet TAKE 2 TABLETS (1,000 MG TOTAL) BY MOUTH 2 (TWO) TIMES DAILY WITH A MEAL. 360 tablet 1   Misc Natural Products (TART CHERRY ADVANCED PO) Take by mouth.     Multiple Vitamins-Minerals (MENS MULTIVITAMIN PO) Take by mouth.     Omega-3 Fatty Acids (FISH OIL PO) Take 1,500 mg by mouth daily.     potassium chloride  SA (KLOR-CON  M) 20 MEQ tablet TAKE 1 TABLET BY MOUTH EVERY DAY 90 tablet 1   Quercetin 250 MG TABS Take by mouth 2 (two) times daily.      simvastatin  (ZOCOR ) 10 MG tablet Take 1 tablet (10 mg total) by mouth at bedtime. 90 tablet 1   TURMERIC PO Take by  mouth.     vitamin C (ASCORBIC ACID) 500 MG tablet Take 500 mg by mouth 3 (three) times daily.     No current facility-administered medications for this visit.   Current Medications[1] Allergies[2] Family History  Problem Relation Age of Onset   Breast cancer Mother    Hypertension Mother    Hyperlipidemia Father    Kidney disease Father    Heart disease Father    Hypertension Father    Colon cancer Neg Hx    Stomach cancer Neg Hx    Colon polyps Neg Hx    Esophageal cancer Neg Hx    Rectal cancer Neg Hx    Social History   Socioeconomic History   Marital status: Widowed    Spouse name: Not on file   Number of children: 1   Years of education: Not on file   Highest education level: Bachelor's degree (e.g., BA, AB, BS)  Occupational History   Not on file  Tobacco Use   Smoking status: Former    Current packs/day: 0.00    Types: Cigarettes    Start date: 2000     Quit date: 2005    Years since quitting: 20.9   Smokeless tobacco: Never   Tobacco comments:    1 pack per year  Vaping Use   Vaping status: Never Used  Substance and Sexual Activity   Alcohol use: No   Drug use: No   Sexual activity: Yes  Other Topics Concern   Not on file  Social History Narrative   Occupation:  Energy Manager   Married  -  1 daughter (age 17)    Never Smoked   Alcohol use-no     Drug use-no          Social Drivers of Health   Tobacco Use: Medium Risk (06/20/2024)   Patient History    Smoking Tobacco Use: Former    Smokeless Tobacco Use: Never    Passive Exposure: Not on Actuary Strain: Low Risk (06/15/2024)   Overall Financial Resource Strain (CARDIA)    Difficulty of Paying Living Expenses: Not very hard  Food Insecurity: No Food Insecurity (06/20/2024)   Epic    Worried About Radiation Protection Practitioner of Food in the Last Year: Never true    Ran Out of Food in the Last Year: Never true  Transportation Needs: No Transportation Needs (06/20/2024)   Epic    Lack of Transportation (Medical): No    Lack of Transportation (Non-Medical): No  Physical Activity: Inactive (06/20/2024)   Exercise Vital Sign    Days of Exercise per Week: 0 days    Minutes of Exercise per Session: 0 min  Stress: No Stress Concern Present (06/20/2024)   [employer] of Occupational Health - Occupational Stress Questionnaire    Feeling of Stress: Not at all  Social Connections: Socially Isolated (06/20/2024)   Social Connection and Isolation Panel    Frequency of Communication with Friends and Family: Never    Frequency of Social Gatherings with Friends and Family: More than three times a week    Attends Religious Services: Never    Database Administrator or Organizations: No    Attends Banker Meetings: Not on file    Marital Status: Widowed  Intimate Partner Violence: Not At Risk (06/20/2024)   Epic    Fear of Current or Ex-Partner: No     Emotionally Abused: No    Physically Abused: No    Sexually Abused:  No  Depression (PHQ2-9): Low Risk (06/20/2024)   Depression (PHQ2-9)    PHQ-2 Score: 0  Alcohol Screen: Low Risk (02/16/2024)   Alcohol Screen    Last Alcohol Screening Score (AUDIT): 0  Housing: Low Risk (06/20/2024)   Epic    Unable to Pay for Housing in the Last Year: No    Number of Times Moved in the Last Year: 0    Homeless in the Last Year: No  Utilities: Not At Risk (06/20/2024)   Epic    Threatened with loss of utilities: No  Health Literacy: Adequate Health Literacy (06/20/2024)   B1300 Health Literacy    Frequency of need for help with medical instructions: Never    Physical Exam: There were no vitals filed for this visit. There is no height or weight on file to calculate BMI. GEN: NAD EYE: Sclerae anicteric ENT: MMM CV: Non-tachycardic GI: Soft, NT/ND NEURO:  Alert & Oriented x 3  Lab Results: No results for input(s): WBC, HGB, HCT, PLT in the last 72 hours. BMET No results for input(s): NA, K, CL, CO2, GLUCOSE, BUN, CREATININE, CALCIUM in the last 72 hours. LFT No results for input(s): PROT, ALBUMIN, AST, ALT, ALKPHOS, BILITOT, BILIDIR, IBILI in the last 72 hours. PT/INR No results for input(s): LABPROT, INR in the last 72 hours.   Impression / Plan: This is a 73 y.o.male who presents for Colonoscopy for history of colon polyps and previous inadequate preparation.  The risks and benefits of endoscopic evaluation/treatment were discussed with the patient and/or family; these include but are not limited to the risk of perforation, infection, bleeding, missed lesions, lack of diagnosis, severe illness requiring hospitalization, as well as anesthesia and sedation related illnesses.  The patient's history has been reviewed, patient examined, no change in status, and deemed stable for procedure.  The patient and/or family was provided an opportunity to ask  questions and all were answered.  The patient and/or family is agreeable to proceed.    Aloha Finner, MD [facility] Office # [phone]    [1]  Current Outpatient Medications:    amLODipine  (NORVASC ) 10 MG tablet, TAKE 1 TABLET BY MOUTH EVERY DAY, Disp: 90 tablet, Rfl: 1   Calcium-Magnesium-Zinc 1000-400-15 MG TABS, Take by mouth daily., Disp: , Rfl:    cetirizine (ZYRTEC) 10 MG tablet, Take 10 mg by mouth daily., Disp: , Rfl:    Chelated Potassium 99 MG TABS, Take 99 mg by mouth 2 (two) times daily., Disp: , Rfl:    Cholecalciferol (D3 ADULT PO), Take by mouth., Disp: , Rfl:    Cinnamon 500 MG capsule, Take 500 mg 3 (three) times daily by mouth. +50 mg Chromium in each supplement., Disp: , Rfl:    Coenzyme Q10 (CO Q-10) 100 MG CAPS, Take 200 mg by mouth daily., Disp: , Rfl:    Cyanocobalamin (VITAMIN B 12 PO), Take 1 capsule daily by mouth., Disp: , Rfl:    dapagliflozin  propanediol (FARXIGA ) 10 MG TABS tablet, Take 1 tablet (10 mg total) by mouth daily before breakfast., Disp: 30 tablet, Rfl: 3   famotidine  (PEPCID ) 40 MG tablet, TAKE 1 TABLET BY MOUTH EVERY DAY, Disp: 90 tablet, Rfl: 1   ferrous sulfate  325 (65 FE) MG tablet, Take 1 tablet (325 mg total) by mouth daily with breakfast., Disp: 90 tablet, Rfl: 1   Glucosamine-Chondroitin-Vit D3 1500-1200-800 MG-MG-UNIT PACK, Take by mouth., Disp: , Rfl:    glucose blood test strip, One touch Ultra blue -Check sugars  daily-  DX E11.69, Disp: 100 each, Rfl: 2   hydrochlorothiazide  (HYDRODIURIL ) 25 MG tablet, TAKE 1 TABLET (25 MG TOTAL) BY MOUTH DAILY., Disp: 90 tablet, Rfl: 1   lisinopril  (ZESTRIL ) 40 MG tablet, TAKE 1 TABLET BY MOUTH EVERY DAY, Disp: 90 tablet, Rfl: 1   Melatonin 10 MG TABS, Take by mouth., Disp: , Rfl:    metFORMIN  (GLUCOPHAGE ) 500 MG tablet, TAKE 2 TABLETS (1,000 MG TOTAL) BY MOUTH 2 (TWO) TIMES DAILY WITH A MEAL., Disp: 360 tablet, Rfl: 1   Misc Natural Products (TART CHERRY  ADVANCED PO), Take by mouth., Disp: , Rfl:    Multiple Vitamins-Minerals (MENS MULTIVITAMIN PO), Take by mouth., Disp: , Rfl:    Omega-3 Fatty Acids (FISH OIL PO), Take 1,500 mg by mouth daily., Disp: , Rfl:    potassium chloride  SA (KLOR-CON  M) 20 MEQ tablet, TAKE 1 TABLET BY MOUTH EVERY DAY, Disp: 90 tablet, Rfl: 1   Quercetin 250 MG TABS, Take by mouth 2 (two) times daily. , Disp: , Rfl:    simvastatin  (ZOCOR ) 10 MG tablet, Take 1 tablet (10 mg total) by mouth at bedtime., Disp: 90 tablet, Rfl: 1   TURMERIC PO, Take by mouth., Disp: , Rfl:    vitamin C (ASCORBIC ACID) 500 MG tablet, Take 500 mg by mouth 3 (three) times daily., Disp: , Rfl:  [2] No Known Allergies

## 2024-06-26 NOTE — Patient Instructions (Addendum)
 Continue present medications. Awaiting pathology results. Repeat colonoscopy in 5-7 years for surveillance pending patient's medical issues/history at the time.  Handouts provided on polyps and hemorrhoids.  YOU HAD AN ENDOSCOPIC PROCEDURE TODAY AT THE [facility]:   Refer to the procedure report that was given to you for any specific questions about what was found during the examination.  If the procedure report does not answer your questions, please call your gastroenterologist to clarify.  If you requested that your care partner not be given the details of your procedure findings, then the procedure report has been included in a sealed envelope for you to review at your convenience later.  YOU SHOULD EXPECT: Some feelings of bloating in the abdomen. Passage of more gas than usual.  Walking can help get rid of the air that was put into your GI tract during the procedure and reduce the bloating. If you had a lower endoscopy (such as a colonoscopy or flexible sigmoidoscopy) you may notice spotting of blood in your stool or on the toilet paper. If you underwent a bowel prep for your procedure, you may not have a normal bowel movement for a few days.  Please Note:  You might notice some irritation and congestion in your nose or some drainage.  This is from the oxygen used during your procedure.  There is no need for concern and it should clear up in a day or so.  SYMPTOMS TO REPORT IMMEDIATELY:  Following lower endoscopy (colonoscopy or flexible sigmoidoscopy):  Excessive amounts of blood in the stool  Significant tenderness or worsening of abdominal pains  Swelling of the abdomen that is new, acute  Fever of 100F or higher   For urgent or emergent issues, a gastroenterologist can be reached at any hour by calling [phone]. Do not use MyChart messaging for urgent concerns.    DIET:  We do recommend a small meal at first, but then you may proceed to your regular diet.  Drink  plenty of fluids but you should avoid alcoholic beverages for 24 hours.  ACTIVITY:  You should plan to take it easy for the rest of today and you should NOT DRIVE or use heavy machinery until tomorrow (because of the sedation medicines used during the test).    FOLLOW UP: Our staff will call the number listed on your records the next business day following your procedure.  We will call around 7:15- 8:00 am to check on you and address any questions or concerns that you may have regarding the information given to you following your procedure. If we do not reach you, we will leave a message.     If any biopsies were taken you will be contacted by phone or by letter within the next 1-3 weeks.  Please call us  at [phone] if you have not heard about the biopsies in 3 weeks.    SIGNATURES/CONFIDENTIALITY: You and/or your care partner have signed paperwork which will be entered into your electronic medical record.  These signatures attest to the fact that that the information above on your After Visit Summary has been reviewed and is understood.  Full responsibility of the confidentiality of this discharge information lies with you and/or your care-partner.

## 2024-06-26 NOTE — Op Note (Signed)
 [facility] Patient Name: Evan Best Procedure Date: 06/26/2024 2:46 PM MRN: [id number] Endoscopist: Aloha Finner , MD, [id number] Age: 73 Referring MD:  Date of Birth: February 11, 1951 Gender: Male Account #: [id number] Procedure:                Colonoscopy Indications:              High risk colon cancer surveillance: Personal                            history of colonic polyps, history of previous                            ischemic colitis, previous inadequate preparation Medicines:                Monitored Anesthesia Care Procedure:                Pre-Anesthesia Assessment:                           - Prior to the procedure, a History and Physical                            was performed, and patient medications and                            allergies were reviewed. The patient's tolerance of                            previous anesthesia was also reviewed. The risks                            and benefits of the procedure and the sedation                            options and risks were discussed with the patient.                            All questions were answered, and informed consent                            was obtained. Prior Anticoagulants: The patient has                            taken no anticoagulant or antiplatelet agents. ASA                            Grade Assessment: II - A patient with mild systemic                            disease. After reviewing the risks and benefits,                            the patient was deemed in satisfactory condition to  undergo the procedure.                           After obtaining informed consent, the colonoscope                            was passed under direct vision. Throughout the                            procedure, the patient's blood pressure, pulse, and                            oxygen saturations were monitored continuously. The                            Olympus  Scope [id number] was introduced through the                            anus and advanced to the the cecum, identified by                            appendiceal orifice and ileocecal valve. The                            colonoscopy was somewhat difficult due to a                            redundant colon and significant looping. Successful                            completion of the procedure was aided by changing                            the patient's position, using manual pressure,                            straightening and shortening the scope to obtain                            bowel loop reduction and using scope torsion. The                            patient tolerated the procedure. The quality of the                            bowel preparation was adequate. The ileocecal                            valve, appendiceal orifice, and rectum were                            photographed. Scope In: 2:50:36 PM Scope Out: 3:12:10 PM Scope Withdrawal Time: 0 hours 13 minutes 40 seconds  Total Procedure Duration: 0 hours 21 minutes 34 seconds  Findings:                 The digital rectal exam findings include                            hemorrhoids. Pertinent negatives include no                            palpable rectal lesions.                           A moderate amount of semi-liquid stool was found in                            the entire colon, interfering with visualization.                            Lavage of the area was performed using copious                            amounts, resulting in clearance with adequate                            visualization.                           The colon (entire examined portion) was                            significantly redundant.                           A single small angioectasia without bleeding was                            found in the cecum.                           There was a small lipoma, in the ascending colon.                            A 3 mm polyp was found in the transverse colon. The                            polyp was sessile. The polyp was removed with a                            cold snare. Resection and retrieval were complete.                           Diffuse mucosal changes characterized by scarring                            were found in the descending colon and in the  distal transverse colon (?prior ischemic colitis -                            seen previously on older colonoscopies).                           Normal mucosa was found in the entire colon                            otherwise.                           Non-bleeding non-thrombosed internal hemorrhoids                            were found during retroflexion, during perianal                            exam and during digital exam. The hemorrhoids were                            Grade II (internal hemorrhoids that prolapse but                            reduce spontaneously). Complications:            No immediate complications. Estimated Blood Loss:     Estimated blood loss was minimal. Impression:               - Hemorrhoids found on digital rectal exam.                           - Stool in the entire examined colon.                           - Redundant colon.                           - A single non-bleeding colonic angioectasia.                           - Small lipoma in the ascending colon.                           - One 3 mm polyp in the transverse colon, removed                            with a cold snare. Resected and retrieved.                           - Diffuse mucosal changes were found in the                            descending colon and in the distal transverse  colon, likely previously healed ischemic colitis                            (seen previously on colonscopies in the past).                           - Normal mucosa in the entire examined colon                             otherwise.                           - Non-bleeding non-thrombosed internal hemorrhoids. Recommendation:           - The patient will be observed post-procedure,                            until all discharge criteria are met.                           - Discharge patient to home.                           - Patient has a contact number available for                            emergencies. The signs and symptoms of potential                            delayed complications were discussed with the                            patient. Return to normal activities tomorrow.                            Written discharge instructions were provided to the                            patient.                           - Continue present medications.                           - Await pathology results.                           - Repeat colonoscopy in 5-7 years for surveillance                            pending patient's medical issues/history at the                            time.                           - The findings and recommendations were discussed  with the patient.                           - The findings and recommendations were discussed                            with the patient's family. Aloha Finner, MD 06/26/2024 3:30:14 PM

## 2024-06-27 ENCOUNTER — Telehealth: Payer: Self-pay

## 2024-06-27 LAB — OPHTHALMOLOGY REPORT-SCANNED

## 2024-06-27 NOTE — Telephone Encounter (Signed)
°  Follow up Call-     06/26/2024    2:02 PM 08/02/2022    3:27 PM  Call back number  Post procedure Call Back phone  # [phone]  Permission to leave phone message Yes Yes     Patient questions:  Do you have a fever, pain , or abdominal swelling? No. Pain Score  0 *  Have you tolerated food without any problems? Yes.    Have you been able to return to your normal activities? Yes.    Do you have any questions about your discharge instructions: Diet   No. Medications  No. Follow up visit  No.  Do you have questions or concerns about your Care? No.  Actions: * If pain score is 4 or above: No action needed, pain <4.

## 2024-07-01 ENCOUNTER — Ambulatory Visit: Payer: Self-pay | Admitting: Gastroenterology

## 2024-07-01 LAB — SURGICAL PATHOLOGY

## 2024-07-08 ENCOUNTER — Other Ambulatory Visit

## 2024-07-08 ENCOUNTER — Telehealth: Payer: Self-pay

## 2024-07-08 ENCOUNTER — Other Ambulatory Visit: Payer: Self-pay

## 2024-07-08 DIAGNOSIS — E119 Type 2 diabetes mellitus without complications: Secondary | ICD-10-CM

## 2024-07-08 DIAGNOSIS — M791 Myalgia, unspecified site: Secondary | ICD-10-CM

## 2024-07-08 DIAGNOSIS — E782 Mixed hyperlipidemia: Secondary | ICD-10-CM

## 2024-07-08 DIAGNOSIS — Z006 Encounter for examination for normal comparison and control in clinical research program: Secondary | ICD-10-CM

## 2024-07-08 DIAGNOSIS — I10 Essential (primary) hypertension: Secondary | ICD-10-CM

## 2024-07-08 LAB — CBC WITH DIFFERENTIAL/PLATELET
Basophils Absolute: 0.1 K/uL (ref 0.0–0.1)
Basophils Relative: 1.2 % (ref 0.0–3.0)
Eosinophils Absolute: 0.4 K/uL (ref 0.0–0.7)
Eosinophils Relative: 5.2 % — ABNORMAL HIGH (ref 0.0–5.0)
HCT: 38.7 % — ABNORMAL LOW (ref 39.0–52.0)
Hemoglobin: 13.8 g/dL (ref 13.0–17.0)
Lymphocytes Relative: 20.8 % (ref 12.0–46.0)
Lymphs Abs: 1.8 K/uL (ref 0.7–4.0)
MCHC: 35.6 g/dL (ref 30.0–36.0)
MCV: 84.4 fl (ref 78.0–100.0)
Monocytes Absolute: 0.9 K/uL (ref 0.1–1.0)
Monocytes Relative: 10.7 % (ref 3.0–12.0)
Neutro Abs: 5.2 K/uL (ref 1.4–7.7)
Neutrophils Relative %: 62.1 % (ref 43.0–77.0)
Platelets: 352 K/uL (ref 150.0–400.0)
RBC: 4.59 Mil/uL (ref 4.22–5.81)
RDW: 13.7 % (ref 11.5–15.5)
WBC: 8.4 K/uL (ref 4.0–10.5)

## 2024-07-08 LAB — HEMOGLOBIN A1C: Hgb A1c MFr Bld: 7.2 % — ABNORMAL HIGH (ref 4.6–6.5)

## 2024-07-08 LAB — COMPREHENSIVE METABOLIC PANEL WITH GFR
ALT: 13 U/L (ref 3–53)
AST: 13 U/L (ref 5–37)
Albumin: 4.1 g/dL (ref 3.5–5.2)
Alkaline Phosphatase: 40 U/L (ref 39–117)
BUN: 22 mg/dL (ref 6–23)
CO2: 26 meq/L (ref 19–32)
Calcium: 9.1 mg/dL (ref 8.4–10.5)
Chloride: 101 meq/L (ref 96–112)
Creatinine, Ser: 1.06 mg/dL (ref 0.40–1.50)
GFR: 70.08 mL/min
Glucose, Bld: 184 mg/dL — ABNORMAL HIGH (ref 70–99)
Potassium: 3.8 meq/L (ref 3.5–5.1)
Sodium: 138 meq/L (ref 135–145)
Total Bilirubin: 0.7 mg/dL (ref 0.2–1.2)
Total Protein: 6.5 g/dL (ref 6.0–8.3)

## 2024-07-08 LAB — LIPID PANEL
Cholesterol: 124 mg/dL (ref 28–200)
HDL: 28 mg/dL — ABNORMAL LOW
LDL Cholesterol: 67 mg/dL (ref 10–99)
NonHDL: 96.11
Total CHOL/HDL Ratio: 4
Triglycerides: 147 mg/dL (ref 10.0–149.0)
VLDL: 29.4 mg/dL (ref 0.0–40.0)

## 2024-07-08 LAB — MICROALBUMIN / CREATININE URINE RATIO
Creatinine,U: 115.2 mg/dL
Microalb Creat Ratio: 511.8 mg/g — ABNORMAL HIGH (ref 0.0–30.0)
Microalb, Ur: 59 mg/dL — ABNORMAL HIGH (ref 0.7–1.9)

## 2024-07-08 LAB — MAGNESIUM: Magnesium: 1.7 mg/dL (ref 1.5–2.5)

## 2024-07-08 LAB — TSH: TSH: 1.87 u[IU]/mL (ref 0.35–5.50)

## 2024-07-08 NOTE — Telephone Encounter (Signed)
 Copied from CRM #[id number]. Topic: Clinical - Request for Lab/Test Order >> Jul 08, 2024  1:19 PM Berwyn MATSU wrote: Reason for CRM:  Patient called in requesting lab order for physical to be sent over to the lab at [facility] as he will be going to day.   May you please assist.

## 2024-07-09 ENCOUNTER — Ambulatory Visit: Payer: Self-pay | Admitting: Family Medicine

## 2024-07-09 ENCOUNTER — Other Ambulatory Visit: Payer: Self-pay | Admitting: Medical Genetics

## 2024-07-09 DIAGNOSIS — Z006 Encounter for examination for normal comparison and control in clinical research program: Secondary | ICD-10-CM

## 2024-07-10 NOTE — Progress Notes (Signed)
"  Pt reviewed via MyChart.   "

## 2024-07-14 NOTE — Assessment & Plan Note (Signed)
 Well controlled, no changes to meds. Encouraged heart healthy diet such as the DASH diet and exercise as tolerated.

## 2024-07-14 NOTE — Progress Notes (Signed)
 "  Subjective:    Patient ID: Evan Best, male    DOB: 02-Jun-1951, 74 y.o.   MRN: [id number]  Chief Complaint  Patient presents with   Medical Management of Chronic Issues    Patient presents today for a 5 month follow-up.   Quality Metric Gaps    TDAP vaccine    HPI Discussed the use of AI scribe software for clinical note transcription with the patient, who gave verbal consent to proceed.  History of Present Illness Evan Best is a 74 year old male with sixth nerve palsy who presents with a return of double vision.  He has experienced a return of double vision since Sunday night, which is different from previous episodes. Previously, his left eye would stop tracking, but now there is a greater disparity between images. The double vision is more pronounced when in motion, such as in a car, necessitating reliance on Evan Best for driving. No recent eye examination has been conducted to confirm these observations.  He denies recent trauma, but experiences mild frontal headaches, attributing them to eye strain. No dizziness, hearing changes, or fevers are reported. The double vision fluctuates and was improving until he became lax with blood sugar control.  He has a history of elevated blood glucose levels, with home readings between 150 to 180 mg/dL. He is currently taking metformin , four pills a day. Cost is a concern for adding other medications like Jardiance  or Januvia .  He reports minor balance issues, attributing them to double vision affecting depth perception. Long-standing balance problems are exacerbated by a previous knee surgery, with the left knee occasionally giving out without warning, though not typically while walking.  He mentions a recent issue with feeling cold at night, which is unusual for him as he typically sleeps hot. During the day, he does not feel feverish or chilled.    Past Medical History:  Diagnosis Date   Allergic state 09/22/2013   Anemia  09/07/2015   Arthritis 03/06/2015   Depression    Diabetes mellitus type II 07/2007   Dysphagia 05/04/2017   Esophageal reflux 03/06/2015   Gout 09/18/2012   Left big toe   Hypertension    Insomnia 09/13/2015   Migraine headache    remote history   OSA (obstructive sleep apnea)    Preventative health care 09/05/2014   Right shoulder pain 02/19/2009   Qualifier: Diagnosis of  By: Georgian ROSALEA CHARM Lamar    RLS (restless legs syndrome) 09/07/2015   Sleep apnea    uses CPAP   Tremor 09/07/2015    Past Surgical History:  Procedure Laterality Date   COLONOSCOPY     HERNIA REPAIR  07/11/1996   umbilical hernia repair   KNEE SURGERY     left knee surgery   TONSILLECTOMY  07/11/1957   VASECTOMY      Family History  Problem Relation Age of Onset   Breast cancer Mother    Hypertension Mother    Cancer Mother    Hyperlipidemia Father    Kidney disease Father    Heart disease Father    Hypertension Father    ADD / ADHD Daughter    Colon cancer Neg Hx    Stomach cancer Neg Hx    Colon polyps Neg Hx    Esophageal cancer Neg Hx    Rectal cancer Neg Hx     Social History   Socioeconomic History   Marital status: Widowed    Spouse name: Not on file  Number of children: 1   Years of education: Not on file   Highest education level: Bachelor's degree (e.g., BA, AB, BS)  Occupational History   Not on file  Tobacco Use   Smoking status: Former    Current packs/day: 0.00    Average packs/day: 0.1 packs/day for 15.0 years (0.8 ttl pk-yrs)    Types: Cigarettes    Start date: 2000    Quit date: 2005    Years since quitting: 21.0   Smokeless tobacco: Never   Tobacco comments:    1 pack per year  Vaping Use   Vaping status: Never Used  Substance and Sexual Activity   Alcohol use: No   Drug use: No   Sexual activity: Yes  Other Topics Concern   Not on file  Social History Narrative   Occupation:  Energy Manager   Married  -  1 daughter (age 25)    Never Smoked    Alcohol use-no     Drug use-no          Social Drivers of Health   Tobacco Use: Medium Risk (07/18/2024)   Patient History    Smoking Tobacco Use: Former    Smokeless Tobacco Use: Never    Passive Exposure: Not on Actuary Strain: Low Risk (06/15/2024)   Overall Financial Resource Strain (CARDIA)    Difficulty of Paying Living Expenses: Not very hard  Food Insecurity: No Food Insecurity (06/20/2024)   Epic    Worried About Radiation Protection Practitioner of Food in the Last Year: Never true    Ran Out of Food in the Last Year: Never true  Transportation Needs: No Transportation Needs (06/20/2024)   Epic    Lack of Transportation (Medical): No    Lack of Transportation (Non-Medical): No  Physical Activity: Inactive (06/20/2024)   Exercise Vital Sign    Days of Exercise per Week: 0 days    Minutes of Exercise per Session: 0 min  Stress: No Stress Concern Present (06/20/2024)   [employer] of Occupational Health - Occupational Stress Questionnaire    Feeling of Stress: Not at all  Social Connections: Socially Isolated (06/20/2024)   Social Connection and Isolation Panel    Frequency of Communication with Friends and Family: Never    Frequency of Social Gatherings with Friends and Family: More than three times a week    Attends Religious Services: Never    Database Administrator or Organizations: No    Attends Banker Meetings: Not on file    Marital Status: Widowed  Intimate Partner Violence: Not At Risk (06/20/2024)   Epic    Fear of Current or Ex-Partner: No    Emotionally Abused: No    Physically Abused: No    Sexually Abused: No  Depression (PHQ2-9): Medium Risk (07/18/2024)   Depression (PHQ2-9)    PHQ-2 Score: 8  Alcohol Screen: Low Risk (02/16/2024)   Alcohol Screen    Last Alcohol Screening Score (AUDIT): 0  Housing: Low Risk (06/20/2024)   Epic    Unable to Pay for Housing in the Last Year: No    Number of Times Moved in the Last Year: 0     Homeless in the Last Year: No  Utilities: Not At Risk (06/20/2024)   Epic    Threatened with loss of utilities: No  Health Literacy: Adequate Health Literacy (06/20/2024)   B1300 Health Literacy    Frequency of need for help with medical instructions: Never  Outpatient Medications Prior to Visit  Medication Sig Dispense Refill   amLODipine  (NORVASC ) 10 MG tablet TAKE 1 TABLET BY MOUTH EVERY DAY 90 tablet 1   Calcium-Magnesium-Zinc 1000-400-15 MG TABS Take by mouth daily.     cetirizine (ZYRTEC) 10 MG tablet Take 10 mg by mouth daily.     Chelated Potassium 99 MG TABS Take 99 mg by mouth 2 (two) times daily.     Cholecalciferol (D3 ADULT PO) Take by mouth.     Cinnamon 500 MG capsule Take 500 mg 3 (three) times daily by mouth. +50 mg Chromium in each supplement.     Coenzyme Q10 (CO Q-10) 100 MG CAPS Take 200 mg by mouth daily.     Cyanocobalamin (VITAMIN B 12 PO) Take 1 capsule daily by mouth.     famotidine  (PEPCID ) 40 MG tablet TAKE 1 TABLET BY MOUTH EVERY DAY 90 tablet 1   ferrous sulfate  325 (65 FE) MG tablet Take 1 tablet (325 mg total) by mouth daily with breakfast. 90 tablet 1   Glucosamine-Chondroitin-Vit D3 1500-1200-800 MG-MG-UNIT PACK Take by mouth.     glucose blood test strip One touch Ultra blue -Check sugars daily-  DX E11.69 100 each 2   hydrochlorothiazide  (HYDRODIURIL ) 25 MG tablet TAKE 1 TABLET (25 MG TOTAL) BY MOUTH DAILY. 90 tablet 1   Melatonin 10 MG TABS Take by mouth.     metFORMIN  (GLUCOPHAGE ) 500 MG tablet TAKE 2 TABLETS (1,000 MG TOTAL) BY MOUTH 2 (TWO) TIMES DAILY WITH A MEAL. 360 tablet 1   Misc Natural Products (TART CHERRY ADVANCED PO) Take by mouth.     Multiple Vitamins-Minerals (MENS MULTIVITAMIN PO) Take by mouth.     Omega-3 Fatty Acids (FISH OIL PO) Take 1,500 mg by mouth daily.     potassium chloride  SA (KLOR-CON  M) 20 MEQ tablet TAKE 1 TABLET BY MOUTH EVERY DAY 90 tablet 1   Quercetin 250 MG TABS Take by mouth 2 (two) times daily.       simvastatin  (ZOCOR ) 10 MG tablet Take 1 tablet (10 mg total) by mouth at bedtime. 90 tablet 1   TURMERIC PO Take by mouth.     vitamin C (ASCORBIC ACID) 500 MG tablet Take 500 mg by mouth 3 (three) times daily.     lisinopril  (ZESTRIL ) 40 MG tablet TAKE 1 TABLET BY MOUTH EVERY DAY 90 tablet 1   No facility-administered medications prior to visit.    Allergies[1]  Review of Systems  Constitutional:  Negative for fever and malaise/fatigue.  HENT:  Negative for congestion.   Eyes:  Positive for double vision. Negative for blurred vision.  Respiratory:  Negative for shortness of breath.   Cardiovascular:  Negative for chest pain, palpitations and leg swelling.  Gastrointestinal:  Negative for abdominal pain, blood in stool and nausea.  Genitourinary:  Negative for dysuria and frequency.  Musculoskeletal:  Positive for joint pain and myalgias. Negative for falls.  Skin:  Negative for rash.  Neurological:  Positive for headaches. Negative for dizziness and loss of consciousness.  Endo/Heme/Allergies:  Negative for environmental allergies.  Psychiatric/Behavioral:  Negative for depression. The patient is not nervous/anxious.        Objective:    Physical Exam Vitals reviewed.  Constitutional:      Appearance: Normal appearance. He is not ill-appearing.  HENT:     Head: Normocephalic and atraumatic.     Nose: Nose normal.  Eyes:     Conjunctiva/sclera: Conjunctivae normal.  Cardiovascular:  Rate and Rhythm: Normal rate.     Pulses: Normal pulses.     Heart sounds: Normal heart sounds. No murmur heard. Pulmonary:     Effort: Pulmonary effort is normal.     Breath sounds: Normal breath sounds. No wheezing.  Abdominal:     Palpations: Abdomen is soft. There is no mass.     Tenderness: There is no abdominal tenderness.  Musculoskeletal:     Cervical back: Normal range of motion.     Right lower leg: No edema.     Left lower leg: No edema.  Skin:    General: Skin is warm and  dry.  Neurological:     General: No focal deficit present.     Mental Status: He is alert and oriented to person, place, and time.  Psychiatric:        Mood and Affect: Mood normal.     BP 134/86   Pulse 78   Temp 97.6 F (36.4 C)   Resp 16   Ht 5' 11 (1.803 m)   Wt 205 lb 3.2 oz (93.1 kg)   SpO2 97%   BMI 28.62 kg/m  Wt Readings from Last 3 Encounters:  07/18/24 205 lb 3.2 oz (93.1 kg)  06/26/24 205 lb (93 kg)  06/20/24 207 lb 12.8 oz (94.3 kg)    Diabetic Foot Exam - Simple   No data filed    Lab Results  Component Value Date   WBC 8.4 07/08/2024   HGB 13.8 07/08/2024   HCT 38.7 (L) 07/08/2024   PLT 352.0 07/08/2024   GLUCOSE 184 (H) 07/08/2024   CHOL 124 07/08/2024   TRIG 147.0 07/08/2024   HDL 28.00 (L) 07/08/2024   LDLDIRECT 74.0 08/03/2017   LDLCALC 67 07/08/2024   ALT 13 07/08/2024   AST 13 07/08/2024   NA 138 07/08/2024   K 3.8 07/08/2024   CL 101 07/08/2024   CREATININE 1.06 07/08/2024   BUN 22 07/08/2024   CO2 26 07/08/2024   TSH 1.87 07/08/2024   PSA 0.75 01/26/2023   HGBA1C 7.2 (H) 07/08/2024   MICROALBUR 59.0 (H) 07/08/2024    Lab Results  Component Value Date   TSH 1.87 07/08/2024   Lab Results  Component Value Date   WBC 8.4 07/08/2024   HGB 13.8 07/08/2024   HCT 38.7 (L) 07/08/2024   MCV 84.4 07/08/2024   PLT 352.0 07/08/2024   Lab Results  Component Value Date   NA 138 07/08/2024   K 3.8 07/08/2024   CO2 26 07/08/2024   GLUCOSE 184 (H) 07/08/2024   BUN 22 07/08/2024   CREATININE 1.06 07/08/2024   BILITOT 0.7 07/08/2024   ALKPHOS 40 07/08/2024   AST 13 07/08/2024   ALT 13 07/08/2024   PROT 6.5 07/08/2024   ALBUMIN 4.1 07/08/2024   CALCIUM 9.1 07/08/2024   GFR 70.08 07/08/2024   Lab Results  Component Value Date   CHOL 124 07/08/2024   Lab Results  Component Value Date   HDL 28.00 (L) 07/08/2024   Lab Results  Component Value Date   LDLCALC 67 07/08/2024   Lab Results  Component Value Date   TRIG 147.0  07/08/2024   Lab Results  Component Value Date   CHOLHDL 4 07/08/2024   Lab Results  Component Value Date   HGBA1C 7.2 (H) 07/08/2024       Assessment & Plan:  Myalgia Assessment & Plan: Hydrate and monitor   Hyperlipidemia, mixed Assessment & Plan: Tolerating statin, encouraged heart  healthy diet, avoid trans fats, minimize simple carbs and saturated fats. Increase exercise as tolerated   Essential hypertension Assessment & Plan: Well controlled, no changes to meds. Encouraged heart healthy diet such as the DASH diet and exercise as tolerated.     Chronic gout without tophus, unspecified cause, unspecified site Assessment & Plan: Hydrate and monitor    Type 2 diabetes mellitus in patient with obesity [facility]) Assessment & Plan: hgba1c acceptable, minimize simple carbs. Increase exercise as tolerated. Continue current meds   Orders: -     MR BRAIN W WO CONTRAST; Future -     AMB Referral VBCI Care Management -     Ambulatory referral to Ophthalmology  Diplopia -     MR BRAIN W WO CONTRAST; Future -     AMB Referral VBCI Care Management -     Ambulatory referral to Ophthalmology  Nonintractable headache, unspecified chronicity pattern, unspecified headache type -     MR BRAIN W WO CONTRAST; Future -     Ambulatory referral to Ophthalmology  Other orders -     Valsartan ; Take 1 tablet (320 mg total) by mouth daily.  Dispense: 90 tablet; Refill: 1    Assessment and Plan Assessment & Plan Diplopia Recurrent diplopia since Sunday night, with left eye turning in more than the right. No headache, trauma, dizziness, or hearing changes. Possible sixth nerve palsy. - Ordered MRI of the brain to rule out optic nerve compression. - Referred to ophthalmologist for further evaluation.  Type 2 diabetes mellitus with proteinuria Blood glucose levels range from 150 to 180 mg/dL. Proteinuria present. Discussed Jardiance  for glycemic control and renal protection. Financial  constraints noted, but patient assistance explored. GLP-1 agonists discussed as an alternative, though not yet available. - Referred to pharmacist for patient assistance with Jardiance . - Continue metformin  1000 mg oral bid. - Monitor blood glucose levels regularly.  Essential hypertension Blood pressure readings elevated, with diastolic trending high. Discussed switching from lisinopril  to valsartan  for better control. Consideration of metoprolol if additional control is needed. - Switched from lisinopril  to valsartan  320 mg oral daily. - Scheduled follow-up in one month for blood pressure check and kidney function tests.  Chronic right foot pain with osteoarthritis  Nonintractable headache Mild frontal headache, likely due to eye strain. No nausea or vomiting. Pain level 1-2 out of 10. - Continue current management as headache is mild and not requiring additional intervention.  General Health Maintenance Discussed RSV and tetanus vaccinations. RSV vaccine recommended every five years. Tetanus vaccine due. - Consider RSV and tetanus vaccinations when feeling better.  Recording duration: 37 minutes     Harlene Horton, MD    [1] No Known Allergies  "

## 2024-07-14 NOTE — Assessment & Plan Note (Signed)
 [id number] acceptable, minimize simple carbs. Increase exercise as tolerated. Continue current meds

## 2024-07-14 NOTE — Assessment & Plan Note (Signed)
 Hydrate and monitor

## 2024-07-14 NOTE — Assessment & Plan Note (Signed)
 Tolerating statin, encouraged heart healthy diet, avoid trans fats, minimize simple carbs and saturated fats. Increase exercise as tolerated

## 2024-07-18 ENCOUNTER — Ambulatory Visit: Payer: Medicare Other | Admitting: Family Medicine

## 2024-07-18 ENCOUNTER — Encounter: Payer: Self-pay | Admitting: Family Medicine

## 2024-07-18 VITALS — BP 134/86 | HR 78 | Temp 97.6°F | Resp 16 | Ht 71.0 in | Wt 205.2 lb

## 2024-07-18 DIAGNOSIS — E782 Mixed hyperlipidemia: Secondary | ICD-10-CM

## 2024-07-18 DIAGNOSIS — I10 Essential (primary) hypertension: Secondary | ICD-10-CM | POA: Diagnosis not present

## 2024-07-18 DIAGNOSIS — Z7984 Long term (current) use of oral hypoglycemic drugs: Secondary | ICD-10-CM

## 2024-07-18 DIAGNOSIS — M791 Myalgia, unspecified site: Secondary | ICD-10-CM | POA: Diagnosis not present

## 2024-07-18 DIAGNOSIS — M1A9XX0 Chronic gout, unspecified, without tophus (tophi): Secondary | ICD-10-CM | POA: Diagnosis not present

## 2024-07-18 DIAGNOSIS — E669 Obesity, unspecified: Secondary | ICD-10-CM

## 2024-07-18 DIAGNOSIS — H532 Diplopia: Secondary | ICD-10-CM

## 2024-07-18 DIAGNOSIS — E119 Type 2 diabetes mellitus without complications: Secondary | ICD-10-CM

## 2024-07-18 DIAGNOSIS — R519 Headache, unspecified: Secondary | ICD-10-CM | POA: Diagnosis not present

## 2024-07-18 MED ORDER — VALSARTAN 320 MG PO TABS: 320.0000 mg | ORAL_TABLET | Freq: Every day | ORAL | 1 refills | Status: AC

## 2024-07-21 ENCOUNTER — Encounter: Payer: Self-pay | Admitting: Family Medicine

## 2024-07-21 LAB — GENECONNECT MOLECULAR SCREEN: Genetic Analysis Overall Interpretation: NEGATIVE

## 2024-07-22 LAB — OPHTHALMOLOGY REPORT-SCANNED

## 2024-07-23 ENCOUNTER — Telehealth: Payer: Self-pay

## 2024-07-23 NOTE — Progress Notes (Signed)
 Complex Care Management Note Care Guide Note  07/23/2024 Name: Evan Best MRN: [id number] DOB: [birth year]   Complex Care Management Outreach Attempts: An unsuccessful telephone outreach was attempted today to offer the patient information about available complex care management services.  Follow Up Plan:  Additional outreach attempts will be made to offer the patient complex care management information and services.   Encounter Outcome:  No Answer  Dreama [facility]  [facility], [facility] VBCI Assistant Direct Dial: [phone]  Fax: [phone]

## 2024-07-25 NOTE — Progress Notes (Signed)
 Complex Care Management Note Care Guide Note  07/25/2024 Name: Evan Best MRN: [id number] DOB: [birth year]   Complex Care Management Outreach Attempts: A second unsuccessful outreach was attempted today to offer the patient with information about available complex care management services.  Follow Up Plan:  Additional outreach attempts will be made to offer the patient complex care management information and services.   Encounter Outcome:  No Answer  Dreama [facility]wood [facility]  [facility], [facility] VBCI Assistant Direct Dial: [phone]  Fax: [phone]

## 2024-07-26 NOTE — Progress Notes (Signed)
 Complex Care Management Note  Care Guide Note 07/26/2024 Name: Evan Best MRN: [id number] DOB: February 27, 1951  Evan Best is a 74 y.o. year old male who sees Domenica Harlene LABOR, MD for primary care. I reached out to Charlie FORBES Lat by phone today to offer complex care management services.  [name] was given information about Complex Care Management services today including:   The Complex Care Management services include support from the care team which includes your Nurse Care Manager, Clinical Social Worker, or Pharmacist.  The Complex Care Management team is here to help remove barriers to the health concerns and goals most important to you. Complex Care Management services are voluntary, and the patient may decline or stop services at any time by request to their care team member.   Complex Care Management Consent Status: Patient agreed to services and verbal consent obtained.   Follow up plan:  Telephone appointment with complex care management team member scheduled for:  08/05/24 at 1:30 p.m.   Encounter Outcome:  Patient Scheduled  Dreama [facility]  [facility], [facility] VBCI Assistant Direct Dial: [phone]  Fax: [phone]

## 2024-07-26 NOTE — Progress Notes (Signed)
 Complex Care Management Note Care Guide Note  07/26/2024 Name: Evan Best MRN: [id number] DOB: [birth year]   Complex Care Management Outreach Attempts: A third unsuccessful outreach was attempted today to offer the patient with information about available complex care management services.  Follow Up Plan:  No further outreach attempts will be made at this time. We have been unable to contact the patient to offer or enroll patient in complex care management services.  Encounter Outcome:  No Answer  Dreama [facility]  [facility], [facility] VBCI Assistant Direct Dial: [phone]  Fax: [phone]

## 2024-07-30 ENCOUNTER — Ambulatory Visit: Admitting: Podiatry

## 2024-07-30 ENCOUNTER — Encounter: Payer: Self-pay | Admitting: Podiatry

## 2024-07-30 DIAGNOSIS — M79674 Pain in right toe(s): Secondary | ICD-10-CM | POA: Diagnosis not present

## 2024-07-30 DIAGNOSIS — M79675 Pain in left toe(s): Secondary | ICD-10-CM

## 2024-07-30 DIAGNOSIS — E1142 Type 2 diabetes mellitus with diabetic polyneuropathy: Secondary | ICD-10-CM

## 2024-07-30 DIAGNOSIS — B351 Tinea unguium: Secondary | ICD-10-CM

## 2024-08-05 ENCOUNTER — Other Ambulatory Visit: Admitting: Pharmacist

## 2024-08-05 ENCOUNTER — Telehealth: Payer: Self-pay | Admitting: Pharmacist

## 2024-08-05 ENCOUNTER — Encounter: Payer: Self-pay | Admitting: Podiatry

## 2024-08-05 DIAGNOSIS — E119 Type 2 diabetes mellitus without complications: Secondary | ICD-10-CM

## 2024-08-05 NOTE — Telephone Encounter (Signed)
 Attempt was made to contact patient by phone today for follow up by Clinical Pharmacist regarding cost of Jardiance .  Unable to reach patient. LM on VM with my contact number [phone].  Tried to reach patient again 20 minutes later - still no answer. Will try again in 1 week.

## 2024-08-05 NOTE — Progress Notes (Signed)
 "  08/05/2024 Name: Evan Best MRN: [id number] DOB: [birth year]  Chief Complaint  Patient presents with   Medication Adherence   Medication Management    Evan Best is a 74 y.o. year old male who presented for a telephone visit.   They were referred to the pharmacist by their PCP for assistance in managing medication access.    Subjective:  Dr Domenica would like patient to start Jardiance  for diabetes and for cardiorenal protection but patient states cost is too high.   Care Team: Primary Care Provider: Domenica Harlene LABOR, MD ; Next Scheduled Visit: not scheduled - patient will be transferring care to Dr Wendolyn at [facility] in May 2026  Medication Access/Adherence  Current Pharmacy:  [id number] [address] [place], [place] - [address] [place] [address] Phone: [phone] Fax: [phone]   Patient reports affordability concerns with their medications: Yes  Patient reports access/transportation concerns to their pharmacy: No  Patient reports adherence concerns with their medications:  Yes      Macrovascular and Microvascular Risk Reduction:  Statin? yes (simvastatin  ); ACEi/ARB? yes (valsartan ) Last urinary albumin/creatinine ratio:  Lab Results  Component Value Date   MICRALBCREAT 511.8 (H) 07/08/2024   MICRALBCREAT 121.7 (H) 09/06/2023   MICRALBCREAT 9.4 04/25/2008   Last eye exam:  Lab Results  Component Value Date   HMDIABEYEEXA No Retinopathy 07/22/2024   Last foot exam: No foot exam found Tobacco Use:  Tobacco Use: Medium Risk (08/05/2024)   Patient History    Smoking Tobacco Use: Former    Smokeless Tobacco Use: Never    Passive Exposure: Not on file     Objective:  Lab Results  Component Value Date   HGBA1C 7.2 (H) 07/08/2024    Lab Results  Component Value Date   CREATININE 1.06 07/08/2024   BUN 22 07/08/2024   NA 138 07/08/2024   K 3.8 07/08/2024   CL 101 07/08/2024   CO2 26 07/08/2024    Lab Results   Component Value Date   CHOL 124 07/08/2024   HDL 28.00 (L) 07/08/2024   LDLCALC 67 07/08/2024   LDLDIRECT 74.0 08/03/2017   TRIG 147.0 07/08/2024   CHOLHDL 4 07/08/2024    Medications Reviewed Today     Reviewed by Carla Milling, RPH-CPP (Pharmacist) on 08/05/24 at 1459  Med List Status: <None>   Medication Order Taking? Sig Documenting Provider Last Dose Status Informant  amLODipine  (NORVASC ) 10 MG tablet [id number] Yes TAKE 1 TABLET BY MOUTH EVERY DAY Domenica Harlene LABOR, MD  Active   Calcium-Magnesium-Zinc 1000-400-15 MG TABS [id number] Yes Take by mouth daily. [provider]  Active   cetirizine (ZYRTEC) 10 MG tablet [id number] Yes Take 10 mg by mouth daily. [provider]  Active   Chelated Potassium 99 MG TABS [id number] Yes Take 99 mg by mouth 2 (two) times daily. [provider]  Active   Cholecalciferol (D3 ADULT PO) [id number] Yes Take by mouth. [provider]  Active   Cinnamon 500 MG capsule [id number] Yes Take 500 mg 3 (three) times daily by mouth. +50 mg Chromium in each supplement. [provider]  Active   Coenzyme Q10 (CO Q-10) 100 MG CAPS [id number] Yes Take 200 mg by mouth daily. [provider]  Active   Cyanocobalamin (VITAMIN B 12 PO) [id number] Yes Take 1 capsule daily by mouth. [provider]  Active   famotidine  (PEPCID ) 40 MG tablet [id number] Yes TAKE 1 TABLET BY MOUTH EVERY  MICHAE Domenica Harlene DELENA, MD  Active   ferrous sulfate  325 (65 FE) MG tablet [id number] Yes Take 1 tablet (325 mg total) by mouth daily with breakfast. Domenica Harlene DELENA, MD  Active   Glucosamine-Chondroitin-Vit D3 1500-1200-800 MG-MG-UNIT PACK [id number] Yes Take by mouth. [provider]  Active   glucose blood test strip [id number] Yes One touch Ultra blue -Check sugars daily-  DX E11.69 Domenica Harlene DELENA, MD  Active   hydrochlorothiazide  (HYDRODIURIL ) 25 MG tablet [id number] Yes TAKE 1 TABLET (25 MG TOTAL) BY MOUTH DAILY. Domenica Harlene DELENA, MD   Active   Melatonin 10 MG TABS [id number] Yes Take by mouth. [provider]  Active   metFORMIN  (GLUCOPHAGE ) 500 MG tablet [id number] Yes TAKE 2 TABLETS (1,000 MG TOTAL) BY MOUTH 2 (TWO) TIMES DAILY WITH A MEAL. Domenica Harlene DELENA, MD  Active   Misc Natural Products [facility] ADVANCED PO) [id number] Yes Take by mouth. [provider]  Active   Multiple Vitamins-Minerals (MENS MULTIVITAMIN PO) [id number] Yes Take by mouth. [provider]  Active   Omega-3 Fatty Acids (FISH OIL PO) [id number] Yes Take 1,500 mg by mouth daily. [provider]  Active   potassium chloride  SA (KLOR-CON  M) 20 MEQ tablet [id number] Yes TAKE 1 TABLET BY MOUTH EVERY DAY Domenica Harlene DELENA, MD  Active   Quercetin 250 MG TABS [id number] Yes Take by mouth 2 (two) times daily.  [provider]  Active   simvastatin  (ZOCOR ) 10 MG tablet [id number] Yes Take 1 tablet (10 mg total) by mouth at bedtime. Domenica Harlene DELENA, MD  Active   TURMERIC PO [id number] Yes Take by mouth. [provider]  Active   valsartan  (DIOVAN ) 320 MG tablet [id number] Yes Take 1 tablet (320 mg total) by mouth daily. Domenica Harlene DELENA, MD  Active   vitamin C (ASCORBIC ACID) 500 MG tablet [id number] Yes Take 500 mg by mouth 3 (three) times daily. [provider]  Active               Assessment/Plan:   Medication Management: - Discussed 2026 Part D plan with [facility] - deductible is [phone]. Cost of Jardiance  before deductible would be $205 for about 3 months, then once meets deductible since Jardiance  is tier 3, cost would be 25% of med cost or about $52/month.  Also checked cost of Farxiga  / dapagliflozin  - cost before deductible would be $182 for about 3 or 4 months, then cost would be $46 / month - both Farxiga  and dapagliflozin  are also tier 3.  - Assisted patient in applying for Medicare Extra Assistance - re-entry number is [id number].  - Forwarded patient application for Jardiance  patient assistance  program.  Follow Up Plan: 2 to 3 weeks  Madelin Ray, PharmD Clinical Pharmacist [facility]    "

## 2024-08-05 NOTE — Progress Notes (Signed)
"  °  Subjective:  Patient ID: Evan Best, male    DOB: [birth year],  MRN: [id number]  Evan Best presents to clinic today for at risk foot care with history of diabetic neuropathy and callus(es) right lower extremity and painful mycotic toenails that are difficult to trim. Painful toenails interfere with ambulation. Aggravating factors include wearing enclosed shoe gear. Pain is relieved with periodic professional debridement. Painful calluses are aggravated when weightbearing with and without shoegear. Pain is relieved with periodic professional debridement.  Chief Complaint  Patient presents with   Diabetes    Evan Best Toenail trim. A1C 7.2. LOV with PCP 07/18/24.   New problem(s): None.   PCP is Evan Harlene LABOR, MD.  Allergies[1]  Review of Systems: Negative except as noted in the HPI.  Objective: No changes noted in today's physical examination. There were no vitals filed for this visit. Evan Best is a pleasant 74 y.o. male in NAD. AAO x 3.  Vascular Examination: Capillary refill time immediate b/l. Palpable pedal pulses. Pedal hair present b/l. No pain with calf compression b/l. Skin temperature gradient WNL b/l. No cyanosis or clubbing b/l. No ischemia or gangrene noted b/l. No varicosities noted.  Neurological Examination: Sensation grossly intact b/l with 10 gram monofilament. Vibratory sensation intact b/l. Pt has subjective symptoms of neuropathy.  Dermatological Examination: Pedal skin with normal turgor, texture and tone b/l.  No open wounds. No interdigital macerations.   Toenails 1-5 b/l thick, discolored, elongated with subungual debris and pain on dorsal palpation.   Hyperkeratotic lesion(s) medial IPJ of right great toe.  No erythema, no edema, no drainage, no fluctuance.  Musculoskeletal Examination: Normal muscle strength 5/5 to all lower extremity muscle groups bilaterally. No pain, crepitus or joint limitation noted with ROM b/l LE. No gross bony pedal  deformities b/l. Patient ambulates independently without assistive aids.  Radiographs: None  Assessment/Plan: 1. Pain due to onychomycosis of toenails of both feet   2. Diabetic peripheral neuropathy associated with type 2 diabetes mellitus (HCC)   Patient was evaluated and treated. All patient's and/or POA's questions/concerns addressed on today's visit. Toenails 1-5 b/l debrided in length and girth without incident. Callus(es) medial IPJ of right great toe pared with sharp debridement without incident. Treatment was provided by assistant Andrez Manchester under my supervision. Continue daily foot inspections and monitor blood glucose per PCP/Endocrinologist's recommendations. Continue soft, supportive shoe gear daily. Report any pedal injuries to medical professional. Call office if there are any questions/concerns.  Return in about 3 months (around 10/28/2024).  Delon LITTIE Merlin, DPM      [facility] LOCATION: [address] [place] [address]                   Office [phone]   [facility] LOCATION: [address] [place], [place] [address] Office [phone]     [1] No Known Allergies  "

## 2024-08-09 ENCOUNTER — Ambulatory Visit (HOSPITAL_BASED_OUTPATIENT_CLINIC_OR_DEPARTMENT_OTHER)
Admission: RE | Admit: 2024-08-09 | Discharge: 2024-08-09 | Disposition: A | Source: Ambulatory Visit | Attending: Family Medicine | Admitting: Family Medicine

## 2024-08-09 DIAGNOSIS — H532 Diplopia: Secondary | ICD-10-CM | POA: Insufficient documentation

## 2024-08-09 DIAGNOSIS — R519 Headache, unspecified: Secondary | ICD-10-CM | POA: Insufficient documentation

## 2024-08-09 DIAGNOSIS — E669 Obesity, unspecified: Secondary | ICD-10-CM | POA: Diagnosis present

## 2024-08-09 DIAGNOSIS — E119 Type 2 diabetes mellitus without complications: Secondary | ICD-10-CM | POA: Diagnosis present

## 2024-08-09 MED ORDER — GADOBUTROL 1 MMOL/ML IV SOLN
9.0000 mL | Freq: Once | INTRAVENOUS | Status: AC | PRN
  Administered 2024-08-09: 9 mL via INTRAVENOUS
  Filled 2024-08-09: qty 10

## 2024-08-12 ENCOUNTER — Ambulatory Visit: Payer: Self-pay | Admitting: Family Medicine

## 2024-08-22 ENCOUNTER — Ambulatory Visit: Admitting: Family Medicine

## 2024-08-26 ENCOUNTER — Other Ambulatory Visit

## 2024-10-30 ENCOUNTER — Ambulatory Visit: Admitting: Podiatry

## 2024-11-25 ENCOUNTER — Encounter: Admitting: Family Medicine

## 2024-11-25 ENCOUNTER — Ambulatory Visit: Admitting: Family Medicine

## 2025-06-26 ENCOUNTER — Ambulatory Visit
